# Patient Record
Sex: Female | Born: 1985 | Race: Black or African American | Hispanic: No | Smoking: Former smoker
Health system: Southern US, Community
[De-identification: ages and names within clinical notes are randomized; demographics above are authoritative.]

## PROBLEM LIST (undated history)

## (undated) ENCOUNTER — Inpatient Hospital Stay (HOSPITAL_COMMUNITY): Payer: Self-pay

## (undated) DIAGNOSIS — B009 Herpesviral infection, unspecified: Secondary | ICD-10-CM

## (undated) DIAGNOSIS — R519 Headache, unspecified: Secondary | ICD-10-CM

## (undated) DIAGNOSIS — R51 Headache: Secondary | ICD-10-CM

## (undated) HISTORY — PX: BUNIONECTOMY: SHX129

## (undated) HISTORY — DX: Herpesviral infection, unspecified: B00.9

---

## 2010-11-15 ENCOUNTER — Inpatient Hospital Stay (HOSPITAL_COMMUNITY)
Admission: AD | Admit: 2010-11-15 | Discharge: 2010-11-15 | Disposition: A | Discharge status: Home / Self Care | Payer: Managed Care, Other (non HMO) | Source: Ambulatory Visit | Attending: Obstetrics and Gynecology | Admitting: Obstetrics and Gynecology

## 2010-11-15 ENCOUNTER — Encounter (HOSPITAL_COMMUNITY): Payer: Self-pay

## 2010-11-15 ENCOUNTER — Inpatient Hospital Stay (HOSPITAL_COMMUNITY): Payer: Managed Care, Other (non HMO)

## 2010-11-15 DIAGNOSIS — O200 Threatened abortion: Secondary | ICD-10-CM | POA: Insufficient documentation

## 2010-11-15 LAB — POCT PREGNANCY, URINE: Preg Test, Ur: POSITIVE

## 2010-11-15 LAB — URINE MICROSCOPIC-ADD ON

## 2010-11-15 LAB — URINALYSIS, ROUTINE W REFLEX MICROSCOPIC
Leukocytes, UA: NEGATIVE
Nitrite: NEGATIVE
Protein, ur: NEGATIVE mg/dL
Specific Gravity, Urine: 1.015 (ref 1.005–1.030)
Urobilinogen, UA: 0.2 mg/dL (ref 0.0–1.0)

## 2010-11-15 LAB — CBC
MCH: 29.6 pg (ref 26.0–34.0)
MCHC: 34.7 g/dL (ref 30.0–36.0)
MCV: 85.2 fL (ref 78.0–100.0)
Platelets: 254 10*3/uL (ref 150–400)
RBC: 4.46 MIL/uL (ref 3.87–5.11)

## 2010-11-15 LAB — ABO/RH: ABO/RH(D): B POS

## 2010-11-15 LAB — WET PREP, GENITAL: Trich, Wet Prep: NONE SEEN

## 2010-11-15 NOTE — ED Provider Notes (Signed)
History     Chief Complaint  Patient presents with  . Vaginal Bleeding  . Abdominal Cramping   HPI Brianna Palmer 25 y.o. 5w 2d gestation.  LMP 10-09-10.  Had brown discharge yesterday.  Today has lower abdominal cramping and dark red bleeding.  Is very concerned about this pregnancy.  Has not started prenatal care, but has an appointment on Dec 4 at [facility]   OB History    Grav Para Term Preterm Abortions TAB SAB Ect Mult Living   2    1  1          Past Medical History  Diagnosis Date  . No pertinent past medical history     No past surgical history on file.  No family history on file.  History  Substance Use Topics  . Smoking status: Never Smoker   . Smokeless tobacco: Never Used  . Alcohol Use: No    Allergies: No Known Allergies  Prescriptions prior to admission  Medication Sig Dispense Refill  . oxymetazoline (AFRIN) 0.05 % nasal spray Place 2 sprays into the nose as needed. For congestion       . prenatal vitamin w/FE, FA (PRENATAL 1 + 1) 27-1 MG TABS Take 1 tablet by mouth daily.        Marland Kitchen triamcinolone (KENALOG) 0.1 % cream Apply 1 application topically as needed. For eczema         Review of Systems  Gastrointestinal: Positive for abdominal pain.       Vaginal bleeding   Physical Exam   Blood pressure 115/75, pulse 82, temperature 98.1 F (36.7 C), resp. rate 16, height 5\' 3"  (1.6 m), weight 125 lb 12.8 oz (57.063 kg), last menstrual period 10/09/2010.  Physical Exam  Nursing note and vitals reviewed. Constitutional: She is oriented to person, place, and time. She appears well-developed and well-nourished.  HENT:  Head: Normocephalic.  Eyes: EOM are normal.  Neck: Neck supple.  GI: Soft. There is tenderness. There is no rebound and no guarding.  Genitourinary:       Speculum exam: Vulva: small amount of blood noted Vagina - Mod amount of dark blood noted, small amount of clots Cervix - small amount of active bleeding noted Bimanual exam: Cervix  closed Uterus - mildly tender, normal size Adnexa non tender, no masses bilaterally, hard stool palpated in rectum with bimanual exam GC/Chlam, wet prep done Chaperone present for exam.  Musculoskeletal: Normal range of motion.  Neurological: She is alert and oriented to person, place, and time.  Skin: Skin is warm and dry.  Psychiatric: She has a normal mood and affect.    MAU Course  Procedures  MDM Clinical Data: bleeding, LMP 10-09-10,bleeding; ;  OBSTETRIC <14 WK [place] AND TRANSVAGINAL OB US  Technique: Both transabdominal and transvaginal ultrasound  examinations were performed for complete evaluation of the  gestation as well as the maternal uterus, adnexal regions, and  pelvic cul-de-sac.  Comparison: None.  Findings: No definite intrauterine gestational sac is visualized.  Endometrium 4 mm in thickness. Tiny cystic area seen in the lower  uterine segment/cervical region of unknown etiology. Cannot  completely exclude this is a gestational sac with an in progress  abortion. Recommend follow up serial quantitative beta HCGs and  ultrasounds.  Right corpus luteal cyst. No adnexal masses or abnormality. Trace  free fluid in the pelvis.  IMPRESSION:  Tiny cystic area noted within the lower uterine segment/cervical  region of the endometrium. This may represent a small amount  of  fluid or could represent an early gestational sac. Recommend  follow up with serial quantitative beta HCGs and ultrasounds  Results for orders placed during the hospital encounter of 11/15/10 (from the past 24 hour(s))  URINALYSIS, ROUTINE W REFLEX MICROSCOPIC     Status: Abnormal   Collection Time   11/15/10  9:45 AM      Component Value Range   Color, Urine YELLOW  YELLOW    Appearance CLOUDY (*) CLEAR    Specific Gravity, Urine 1.015  1.005 - 1.030    pH 6.5  5.0 - 8.0    Glucose, UA NEGATIVE  NEGATIVE (mg/dL)   Hgb urine dipstick LARGE (*) NEGATIVE    Bilirubin Urine NEGATIVE  NEGATIVE     Ketones, ur NEGATIVE  NEGATIVE (mg/dL)   Protein, ur NEGATIVE  NEGATIVE (mg/dL)   Urobilinogen, UA 0.2  0.0 - 1.0 (mg/dL)   Nitrite NEGATIVE  NEGATIVE    Leukocytes, UA NEGATIVE  NEGATIVE   URINE MICROSCOPIC-ADD ON     Status: Abnormal   Collection Time   11/15/10  9:45 AM      Component Value Range   Squamous Epithelial / LPF FEW (*) RARE    WBC, UA 0-2  <3 (WBC/hpf)   RBC / HPF 21-50  <3 (RBC/hpf)   Bacteria, UA RARE  RARE   POCT PREGNANCY, URINE     Status: Normal   Collection Time   11/15/10  9:53 AM      Component Value Range   Preg Test, Ur POSITIVE    CBC     Status: Normal   Collection Time   11/15/10 10:04 AM      Component Value Range   WBC 6.2  4.0 - 10.5 (K/uL)   RBC 4.46  3.87 - 5.11 (MIL/uL)   Hemoglobin 13.2  12.0 - 15.0 (g/dL)   HCT 16.1  09.6 - 04.5 (%)   MCV 85.2  78.0 - 100.0 (fL)   MCH 29.6  26.0 - 34.0 (pg)   MCHC 34.7  30.0 - 36.0 (g/dL)   RDW 40.9  81.1 - 91.4 (%)   Platelets 254  150 - 400 (K/uL)  ABO/RH     Status: Normal   Collection Time   11/15/10 10:05 AM      Component Value Range   ABO/RH(D) B POS    HCG, QUANTITATIVE, PREGNANCY     Status: Abnormal   Collection Time   11/15/10 10:05 AM      Component Value Range   hCG, Beta Chain, Quant, S 10 (*) <5 (mIU/mL)  WET PREP, GENITAL     Status: Abnormal   Collection Time   11/15/10 10:05 AM      Component Value Range   Yeast, Wet Prep NONE SEEN  NONE SEEN    Trich, Wet Prep NONE SEEN  NONE SEEN    Clue Cells, Wet Prep NONE SEEN  NONE SEEN    WBC, Wet Prep HPF POC FEW (*) NONE SEEN    Assessment and Plan  Threatened miscarriage Low quant - 10  Plan:  It is likely this pregnancy will end in miscarriage.   You need to return on Tuesday morning between 8 am and 12 noon for repeat lab work to confirm the diagnosis.  Expect to be at the hospital for at least 2 hours. No tampons, no douching, no sex. Nothing in the vagina.  BURLESON,TERRI 11/15/2010, 10:35 AM   Nolene Bernheim, NP 11/15/10  1150

## 2010-11-15 NOTE — Progress Notes (Signed)
Patient presents with brown yesterday, today almost like a period, intercourse 3 days ago, cramping

## 2010-11-16 LAB — GC/CHLAMYDIA PROBE AMP, GENITAL: GC Probe Amp, Genital: NEGATIVE

## 2011-01-02 ENCOUNTER — Inpatient Hospital Stay (HOSPITAL_COMMUNITY)
Admission: AD | Admit: 2011-01-02 | Discharge: 2011-01-02 | Disposition: A | Discharge status: Home / Self Care | Payer: Managed Care, Other (non HMO) | Source: Ambulatory Visit | Attending: Obstetrics & Gynecology | Admitting: Obstetrics & Gynecology

## 2011-01-02 ENCOUNTER — Other Ambulatory Visit (HOSPITAL_COMMUNITY): Payer: Managed Care, Other (non HMO)

## 2011-01-02 ENCOUNTER — Inpatient Hospital Stay (HOSPITAL_COMMUNITY): Payer: Managed Care, Other (non HMO)

## 2011-01-02 ENCOUNTER — Encounter (HOSPITAL_COMMUNITY): Payer: Self-pay | Admitting: *Deleted

## 2011-01-02 DIAGNOSIS — O209 Hemorrhage in early pregnancy, unspecified: Secondary | ICD-10-CM | POA: Insufficient documentation

## 2011-01-02 LAB — URINALYSIS, ROUTINE W REFLEX MICROSCOPIC
Glucose, UA: NEGATIVE mg/dL
Ketones, ur: 15 mg/dL — AB
Leukocytes, UA: NEGATIVE
Protein, ur: NEGATIVE mg/dL
Urobilinogen, UA: 0.2 mg/dL (ref 0.0–1.0)

## 2011-01-02 LAB — DIFFERENTIAL
Basophils Absolute: 0 10*3/uL (ref 0.0–0.1)
Lymphocytes Relative: 34 % (ref 12–46)
Neutro Abs: 3 10*3/uL (ref 1.7–7.7)

## 2011-01-02 LAB — CBC
Platelets: 220 10*3/uL (ref 150–400)
RBC: 4.43 MIL/uL (ref 3.87–5.11)
RDW: 13.2 % (ref 11.5–15.5)
WBC: 5.9 10*3/uL (ref 4.0–10.5)

## 2011-01-02 LAB — URINE MICROSCOPIC-ADD ON

## 2011-01-02 LAB — WET PREP, GENITAL
Trich, Wet Prep: NONE SEEN
Yeast Wet Prep HPF POC: NONE SEEN

## 2011-01-02 LAB — HCG, QUANTITATIVE, PREGNANCY: hCG, Beta Chain, Quant, S: 1066 m[IU]/mL — ABNORMAL HIGH (ref <5)

## 2011-01-02 NOTE — ED Provider Notes (Signed)
History     CSN: [id number]  Arrival date & time 01/02/11  1439   None     Chief Complaint  Patient presents with  . Vaginal Bleeding    HPI Brianna Palmer is a 25 y.o. female presents to MAU for bleeding in early pregnancy. She had a miscarriage and then got pregnant soon after. (before she even had another period). She had a Bhcg drawn in the office last week and then repeated. She has an appointment for follow up on Monday 01/04/11 but came in today due to the bleeding.   Past Medical History  Diagnosis Date  . No pertinent past medical history     Past Surgical History  Procedure Date  . Bunionectomy   . No past surgeries     Family History  Problem Relation Age of Onset  . Adopted: Yes    History  Substance Use Topics  . Smoking status: Never Smoker   . Smokeless tobacco: Never Used  . Alcohol Use: No    OB History    Grav Para Term Preterm Abortions TAB SAB Ect Mult Living   3    2 1 1          Review of Systems  Genitourinary: Positive for vaginal bleeding.       Pregnant   All other systems reviewed and are negative.    Allergies  Review of patient's allergies indicates no known allergies.  Home Medications  No current outpatient prescriptions on file.  BP 112/72  Pulse 77  Temp(Src) 97.9 F (36.6 C) (Oral)  Resp 18  Ht 5\' 3"  (1.6 m)  Wt 126 lb 9.6 oz (57.425 kg)  BMI 22.43 kg/m2  LMP 10/09/2010  Breastfeeding? No  Physical Exam  Nursing note and vitals reviewed. Constitutional: She is oriented to person, place, and time. She appears well-developed and well-nourished. No distress.  HENT:  Head: Normocephalic.  Eyes: EOM are normal.  Neck: Neck supple.  Cardiovascular: Normal rate.   Pulmonary/Chest: Effort normal.  Abdominal: Soft. There is no tenderness.  Genitourinary:       External genitalia without lesions. Small amount of blood vaginal vault. Cervix long, closed and minimal CMT. No adnexal mass or tenderness palpated. Uterus  without palpable enlargement.  Musculoskeletal: Normal range of motion.  Neurological: She is alert and oriented to person, place, and time. No cranial nerve deficit.  Skin: Skin is warm and dry.  Psychiatric: She has a normal mood and affect. Her behavior is normal. Judgment and thought content normal.    ED Course  Procedures   Results for orders placed during the hospital encounter of 01/02/11 (from the past 24 hour(s))  URINALYSIS, ROUTINE W REFLEX MICROSCOPIC     Status: Abnormal   Collection Time   01/02/11  3:00 PM      Component Value Range   Color, Urine YELLOW  YELLOW    APPearance CLEAR  CLEAR    Specific Gravity, Urine 1.020  1.005 - 1.030    pH 6.0  5.0 - 8.0    Glucose, UA NEGATIVE  NEGATIVE (mg/dL)   Hgb urine dipstick SMALL (*) NEGATIVE    Bilirubin Urine NEGATIVE  NEGATIVE    Ketones, ur 15 (*) NEGATIVE (mg/dL)   Protein, ur NEGATIVE  NEGATIVE (mg/dL)   Urobilinogen, UA 0.2  0.0 - 1.0 (mg/dL)   Nitrite NEGATIVE  NEGATIVE    Leukocytes, UA NEGATIVE  NEGATIVE   URINE MICROSCOPIC-ADD ON     Status: Normal  Collection Time   01/02/11  3:00 PM      Component Value Range   Squamous Epithelial / LPF RARE  RARE    WBC, UA 0-2  <3 (WBC/hpf)   RBC / HPF 0-2  <3 (RBC/hpf)   Bacteria, UA RARE  RARE   POCT PREGNANCY, URINE     Status: Normal   Collection Time   01/02/11  3:07 PM      Component Value Range   Preg Test, Ur POSITIVE    CBC     Status: Normal   Collection Time   01/02/11  3:46 PM      Component Value Range   WBC 5.9  4.0 - 10.5 (K/uL)   RBC 4.43  3.87 - 5.11 (MIL/uL)   Hemoglobin 13.2  12.0 - 15.0 (g/dL)   HCT 95.2  84.1 - 32.4 (%)   MCV 84.9  78.0 - 100.0 (fL)   MCH 29.8  26.0 - 34.0 (pg)   MCHC 35.1  30.0 - 36.0 (g/dL)   RDW 40.1  02.7 - 25.3 (%)   Platelets 220  150 - 400 (K/uL)  DIFFERENTIAL     Status: Normal   Collection Time   01/02/11  3:46 PM      Component Value Range   Neutrophils Relative 52  43 - 77 (%)   Neutro Abs 3.0  1.7 -  7.7 (K/uL)   Lymphocytes Relative 34  12 - 46 (%)   Lymphs Abs 2.0  0.7 - 4.0 (K/uL)   Monocytes Relative 12  3 - 12 (%)   Monocytes Absolute 0.7  0.1 - 1.0 (K/uL)   Eosinophils Relative 2  0 - 5 (%)   Eosinophils Absolute 0.1  0.0 - 0.7 (K/uL)   Basophils Relative 0  0 - 1 (%)   Basophils Absolute 0.0  0.0 - 0.1 (K/uL)  HCG, QUANTITATIVE, PREGNANCY     Status: Abnormal   Collection Time   01/02/11  3:46 PM      Component Value Range   hCG, Beta Chain, Quant, S 1066 (*) <5 (mIU/mL)  WET PREP, GENITAL     Status: Abnormal   Collection Time   01/02/11  4:42 PM      Component Value Range   Yeast, Wet Prep NONE SEEN  NONE SEEN    Trich, Wet Prep NONE SEEN  NONE SEEN    Clue Cells, Wet Prep NONE SEEN  NONE SEEN    WBC, Wet Prep HPF POC FEW (*) NONE SEEN    US Ob Transvaginal  01/02/2011  *RADIOLOGY REPORT*  Clinical Data: Early pregnancy, spotting  TRANSVAGINAL OBSTETRIC US  Technique:  Transvaginal ultrasound was performed for complete evaluation of the gestation as well as the maternal uterus, adnexal regions, and pelvic cul-de-sac.  Comparison:  None.  Intrauterine gestational sac: Single.  The gestational sac is slightly elongated and thinned. Yolk sac: Not identified Embryo: Not identified  MSD: 5.1  mm  5    w 0    d  Subchorionic hemorrhage:  Maternal uterus/adnexae:  Arcuate uterus. The adnexa appear normal.  No free fluid.  IMPRESSION:  1.  Single intrauterine gestational sac.  The sac is slightly elongated.  No evidence of yolk sac or embryo.  Differential includes early intrauterine pregnancy, spontaneous abortion, or ectopic pregnancy. 2.  Estimated gestational age by mean sac diameter equals 5 weeks 0 days. 3.  No free fluid.  Original Report Authenticated By: Genevive Bi, M.D.   Assessment:  Bleeding in early pregnancy  Plan:  Follow up as scheduled in the office   Return here as needed MDM          Kerrie Buffalo, NP 01/02/11 1756

## 2011-01-02 NOTE — Progress Notes (Signed)
Pt reports having mild cramping that started yesterday with small amount of brownish blood. Went away but started again this morning. Also reports having vomited after lunch today.

## 2011-01-26 ENCOUNTER — Other Ambulatory Visit: Payer: Self-pay | Admitting: Obstetrics & Gynecology

## 2011-03-08 ENCOUNTER — Other Ambulatory Visit: Payer: Self-pay | Admitting: Obstetrics & Gynecology

## 2011-03-11 ENCOUNTER — Encounter (HOSPITAL_COMMUNITY): Payer: Self-pay

## 2011-03-11 ENCOUNTER — Ambulatory Visit (HOSPITAL_COMMUNITY)
Admission: RE | Admit: 2011-03-11 | Discharge: 2011-03-11 | Disposition: A | Discharge status: Home / Self Care | Payer: Managed Care, Other (non HMO) | Source: Ambulatory Visit | Attending: Obstetrics & Gynecology | Admitting: Obstetrics & Gynecology

## 2011-03-11 DIAGNOSIS — Q519 Congenital malformation of uterus and cervix, unspecified: Secondary | ICD-10-CM | POA: Insufficient documentation

## 2011-03-11 DIAGNOSIS — N96 Recurrent pregnancy loss: Secondary | ICD-10-CM | POA: Insufficient documentation

## 2011-03-11 LAB — PREGNANCY, URINE: Preg Test, Ur: NEGATIVE

## 2011-03-11 MED ORDER — IOHEXOL 300 MG/ML  SOLN
5.0000 mL | Freq: Once | INTRAMUSCULAR | Status: AC | PRN
  Administered 2011-03-11: 5 mL

## 2011-07-28 ENCOUNTER — Other Ambulatory Visit: Payer: Self-pay | Admitting: Obstetrics

## 2011-07-28 DIAGNOSIS — O2620 Pregnancy care for patient with recurrent pregnancy loss, unspecified trimester: Secondary | ICD-10-CM

## 2011-07-28 LAB — OB RESULTS CONSOLE ANTIBODY SCREEN: Antibody Screen: NEGATIVE

## 2011-07-28 LAB — OB RESULTS CONSOLE GC/CHLAMYDIA: Gonorrhea: NEGATIVE

## 2011-07-30 ENCOUNTER — Ambulatory Visit (HOSPITAL_COMMUNITY)
Admission: RE | Admit: 2011-07-30 | Discharge: 2011-07-30 | Disposition: A | Discharge status: Home / Self Care | Payer: Managed Care, Other (non HMO) | Source: Ambulatory Visit | Attending: Obstetrics | Admitting: Obstetrics

## 2011-07-30 DIAGNOSIS — O2620 Pregnancy care for patient with recurrent pregnancy loss, unspecified trimester: Secondary | ICD-10-CM

## 2011-07-30 DIAGNOSIS — O3680X0 Pregnancy with inconclusive fetal viability, not applicable or unspecified: Secondary | ICD-10-CM | POA: Insufficient documentation

## 2011-08-12 ENCOUNTER — Other Ambulatory Visit: Payer: Self-pay | Admitting: Obstetrics & Gynecology

## 2011-08-12 DIAGNOSIS — O3680X0 Pregnancy with inconclusive fetal viability, not applicable or unspecified: Secondary | ICD-10-CM

## 2011-08-19 ENCOUNTER — Ambulatory Visit (HOSPITAL_COMMUNITY)
Admission: RE | Admit: 2011-08-19 | Discharge: 2011-08-19 | Disposition: A | Discharge status: Home / Self Care | Payer: Managed Care, Other (non HMO) | Source: Ambulatory Visit | Attending: Obstetrics & Gynecology | Admitting: Obstetrics & Gynecology

## 2011-08-19 ENCOUNTER — Other Ambulatory Visit: Payer: Self-pay | Admitting: Obstetrics & Gynecology

## 2011-08-19 DIAGNOSIS — O3680X0 Pregnancy with inconclusive fetal viability, not applicable or unspecified: Secondary | ICD-10-CM

## 2011-08-19 DIAGNOSIS — Z3689 Encounter for other specified antenatal screening: Secondary | ICD-10-CM | POA: Insufficient documentation

## 2011-09-12 ENCOUNTER — Ambulatory Visit (INDEPENDENT_AMBULATORY_CARE_PROVIDER_SITE_OTHER): Payer: Managed Care, Other (non HMO) | Admitting: Emergency Medicine

## 2011-09-12 VITALS — BP 92/60 | HR 97 | Temp 98.4°F | Resp 18

## 2011-09-12 DIAGNOSIS — J40 Bronchitis, not specified as acute or chronic: Secondary | ICD-10-CM

## 2011-09-12 DIAGNOSIS — J018 Other acute sinusitis: Secondary | ICD-10-CM

## 2011-09-12 MED ORDER — CEFPROZIL 500 MG PO TABS: 500.0000 mg | ORAL_TABLET | Freq: Two times a day (BID) | ORAL | Status: AC

## 2011-09-12 NOTE — Progress Notes (Signed)
   Date:  09/12/2011   Name:  Zhane Donlan   DOB:  Aug 30, 1985   MRN:  [id number] Gender: female Age: 26 y.o.  PCP:  Tally Due, MD    Chief Complaint: Sore Throat and URI   History of Present Illness:  Quentin Strebel is a 26 y.o. pleasant patient who presents with the following:  Friday became ill with sore throat, nasal congestion, post nasal drainage and a cough.  cough productive purulent sputum.  No fever or chills.  No shortness of breath or wheezing.  [redacted] weeks pregnant.  No OTC meds.  There is no problem list on file for this patient.   Past Medical History  Diagnosis Date  . No pertinent past medical history     Past Surgical History  Procedure Date  . Bunionectomy   . No past surgeries     History  Substance Use Topics  . Smoking status: Never Smoker   . Smokeless tobacco: Never Used  . Alcohol Use: No    Family History  Problem Relation Age of Onset  . Adopted: Yes    No Known Allergies  Medication list has been reviewed and updated.  Current Outpatient Prescriptions on File Prior to Visit  Medication Sig Dispense Refill  . oxymetazoline (AFRIN) 0.05 % nasal spray Place 2 sprays into the nose as needed. For congestion       . prenatal vitamin w/FE, FA (PRENATAL 1 + 1) 27-1 MG TABS Take 1 tablet by mouth daily.          Review of Systems:  As per HPI, otherwise negative.    Physical Examination: Filed Vitals:   09/12/11 1358  BP: 92/60  Pulse: 97  Temp: 98.4 F (36.9 C)  Resp: 18   There were no vitals filed for this visit. There is no height or weight on file to calculate BMI. Ideal Body Weight:    GEN: WDWN, NAD, Non-toxic, A & O x 3 HEENT: Atraumatic, Normocephalic. Neck supple. No masses, No LAD. Ears and Nose: No external deformity.  Nasal mucosa erythematous and swollen  CV: RRR, No M/G/R. No JVD. No thrill. No extra heart sounds. PULM: CTA B, no wheezes, crackles, rhonchi. No retractions. No resp. distress. No  accessory muscle use. ABD: S, NT, ND, +BS. No rebound. No HSM. EXTR: No c/c/e NEURO Normal gait.      Assessment and Plan: Sinusitis Bronchitis Pregnancy, incidental cefzil Robitussin OTC Decongestant on list by OB Follow up as needed  Carmelina Dane, MD

## 2011-11-09 ENCOUNTER — Ambulatory Visit (HOSPITAL_COMMUNITY)
Admission: RE | Admit: 2011-11-09 | Discharge: 2011-11-09 | Disposition: A | Discharge status: Home / Self Care | Payer: Managed Care, Other (non HMO) | Source: Ambulatory Visit | Attending: Obstetrics & Gynecology | Admitting: Obstetrics & Gynecology

## 2011-11-09 ENCOUNTER — Other Ambulatory Visit: Payer: Self-pay | Admitting: Obstetrics & Gynecology

## 2011-11-09 DIAGNOSIS — Z3689 Encounter for other specified antenatal screening: Secondary | ICD-10-CM

## 2011-11-09 DIAGNOSIS — O358XX0 Maternal care for other (suspected) fetal abnormality and damage, not applicable or unspecified: Secondary | ICD-10-CM | POA: Insufficient documentation

## 2011-11-09 DIAGNOSIS — Z363 Encounter for antenatal screening for malformations: Secondary | ICD-10-CM | POA: Insufficient documentation

## 2011-11-09 DIAGNOSIS — Z1389 Encounter for screening for other disorder: Secondary | ICD-10-CM | POA: Insufficient documentation

## 2012-01-09 ENCOUNTER — Encounter (HOSPITAL_COMMUNITY): Payer: Self-pay | Admitting: Obstetrics and Gynecology

## 2012-01-09 ENCOUNTER — Inpatient Hospital Stay (HOSPITAL_COMMUNITY)
Admission: AD | Admit: 2012-01-09 | Discharge: 2012-01-09 | Disposition: A | Discharge status: Home / Self Care | Payer: Managed Care, Other (non HMO) | Source: Ambulatory Visit | Attending: Obstetrics & Gynecology | Admitting: Obstetrics & Gynecology

## 2012-01-09 DIAGNOSIS — O26899 Other specified pregnancy related conditions, unspecified trimester: Secondary | ICD-10-CM

## 2012-01-09 DIAGNOSIS — R109 Unspecified abdominal pain: Secondary | ICD-10-CM

## 2012-01-09 DIAGNOSIS — O99891 Other specified diseases and conditions complicating pregnancy: Secondary | ICD-10-CM | POA: Insufficient documentation

## 2012-01-09 LAB — URINALYSIS, ROUTINE W REFLEX MICROSCOPIC
Hgb urine dipstick: NEGATIVE
Leukocytes, UA: NEGATIVE
Nitrite: NEGATIVE
Protein, ur: NEGATIVE mg/dL
Specific Gravity, Urine: 1.01 (ref 1.005–1.030)
Urobilinogen, UA: 0.2 mg/dL (ref 0.0–1.0)

## 2012-01-09 NOTE — MAU Provider Note (Signed)
History     CSN: [id number]  Arrival date and time: 01/09/12 1503   First Provider Initiated Contact with Patient 01/09/12 1547      Chief Complaint  Patient presents with  . Back Pain  . Abdominal Pain   HPI Brianna Palmer is 25 y.o. G3P0020 [redacted]w[redacted]d weeks presenting with upper abdominal pain while at work.  States she had a Braxton-Hicks contraction and then the pain began and made her back hurt and down the side of her abdomen.    + fetal movement.  Last ate breakfast at 8:30.  Is hungry now but pain began.      Past Medical History  Diagnosis Date  . No pertinent past medical history     Past Surgical History  Procedure Date  . Bunionectomy   . No past surgeries     Family History  Problem Relation Age of Onset  . Adopted: Yes    History  Substance Use Topics  . Smoking status: Never Smoker   . Smokeless tobacco: Never Used  . Alcohol Use: No    Allergies: No Known Allergies  Prescriptions prior to admission  Medication Sig Dispense Refill  . oxymetazoline (AFRIN) 0.05 % nasal spray Place 2 sprays into the nose as needed. For congestion       . prenatal vitamin w/FE, FA (PRENATAL 1 + 1) 27-1 MG TABS Take 1 tablet by mouth daily.          Review of Systems  Constitutional: Negative.   HENT: Negative.   Gastrointestinal: Positive for abdominal pain (upper, mid abdominal pain radiating down the sides of her abdomen).  Genitourinary: Negative for dysuria, urgency and frequency.       Negative for bleeding.  + Fetal movement   Physical Exam   Blood pressure 113/68, pulse 85, temperature 97.7 F (36.5 C), temperature source Oral, resp. rate 16, last menstrual period 10/09/2010.  Physical Exam  Constitutional: She is oriented to person, place, and time. She appears well-developed and well-nourished. No distress.  HENT:  Head: Normocephalic.  Neck: Normal range of motion.  Cardiovascular: Normal rate.   Respiratory: Effort normal.  GI: There is no  tenderness. There is no rebound and no guarding.  Genitourinary:       Cervical exam by Candise Bowens, RN--Closed, long and thick  Neurological: She is alert and oriented to person, place, and time.  Skin: Skin is warm and dry.  Psychiatric: She has a normal mood and affect. Her behavior is normal.   Results for orders placed during the hospital encounter of 01/09/12 (from the past 24 hour(s))  URINALYSIS, ROUTINE W REFLEX MICROSCOPIC     Status: Abnormal   Collection Time   01/09/12  3:22 PM      Component Value Range   Color, Urine YELLOW  YELLOW   APPearance CLEAR  CLEAR   Specific Gravity, Urine 1.010  1.005 - 1.030   pH 6.0  5.0 - 8.0   Glucose, UA NEGATIVE  NEGATIVE mg/dL   Hgb urine dipstick NEGATIVE  NEGATIVE   Bilirubin Urine NEGATIVE  NEGATIVE   Ketones, ur 15 (*) NEGATIVE mg/dL   Protein, ur NEGATIVE  NEGATIVE mg/dL   Urobilinogen, UA 0.2  0.0 - 1.0 mg/dL   Nitrite NEGATIVE  NEGATIVE   Leukocytes, UA NEGATIVE  NEGATIVE   MAU Course  Procedures  FMS  Uterine irritability  Baseline 150 Reactive  MDM 15:55  Reported MSE to Dr. Tamela Oddi.  Order given to po hydrate and  check cervix.  FFN not indicated with absence of contractions on monitor. 16:45  Patient is feeling better.    Up to bathroom.  Will monitor her for 20 more minutes and call Dr. Tamela Oddi.   17:30  Reported cervical exam, improvement of upper abdominal pain and reactive FMS Assessment and Plan  A:  Upper Abdominal pain at [redacted]w[redacted]d gestation -improved with po hydration  P:  Discharge to home     Stressed importance of eating regularly and staying well hydrated     Patient is asking for a note to be out of work tomorrow-given    Follow up with Serita Kyle M 01/09/2012, 3:48 PM

## 2012-02-17 ENCOUNTER — Inpatient Hospital Stay (HOSPITAL_COMMUNITY)
Admission: AD | Admit: 2012-02-17 | Discharge: 2012-02-17 | Disposition: A | Discharge status: Home / Self Care | Payer: Managed Care, Other (non HMO) | Source: Ambulatory Visit | Attending: Obstetrics & Gynecology | Admitting: Obstetrics & Gynecology

## 2012-02-17 ENCOUNTER — Encounter (HOSPITAL_COMMUNITY): Payer: Self-pay | Admitting: *Deleted

## 2012-02-17 DIAGNOSIS — N949 Unspecified condition associated with female genital organs and menstrual cycle: Secondary | ICD-10-CM | POA: Insufficient documentation

## 2012-02-17 DIAGNOSIS — O99891 Other specified diseases and conditions complicating pregnancy: Secondary | ICD-10-CM | POA: Insufficient documentation

## 2012-02-17 NOTE — MAU Provider Note (Signed)
History     CSN: [id number]  Arrival date and time: 02/17/12 0107   None     Chief Complaint  Patient presents with  . Rupture of Membranes   HPI Brianna Palmer is a 27 y.o. female @ [redacted]w[redacted]d gestation who presents to MAU with vaginal discharge. Just after midnight she was on the couch and stood up to go to the bathroom and after voiding stood up and felt watery fluid down her leg. No leaking of fluid since then. Denies pain or other problems. Good fetal movement. The history was provided by the patient.  OB History    Grav Para Term Preterm Abortions TAB SAB Ect Mult Living   4 0   3 1 2          Past Medical History  Diagnosis Date  . No pertinent past medical history     Past Surgical History  Procedure Date  . Bunionectomy   . No past surgeries     Family History  Problem Relation Age of Onset  . Adopted: Yes  . Other Neg Hx     History  Substance Use Topics  . Smoking status: Light Tobacco Smoker -- 0.2 packs/day    Types: Cigarettes  . Smokeless tobacco: Never Used  . Alcohol Use: No    Allergies: No Known Allergies  Prescriptions prior to admission  Medication Sig Dispense Refill  . butalbital-acetaminophen-caffeine (FIORICET, ESGIC) 50-325-40 MG per tablet Take 1 tablet by mouth daily as needed. headaches      . Prenatal Vit-Fe Fumarate-FA (PRENATAL MULTIVITAMIN) TABS Take 1 tablet by mouth daily.        Review of Systems  Constitutional: Negative for fever and chills.  Eyes: Negative for blurred vision and double vision.  Respiratory: Negative for cough.   Cardiovascular: Negative for chest pain.  Gastrointestinal: Negative for nausea, vomiting and abdominal pain.  Genitourinary: Negative for dysuria and urgency.       Vaginal discharge  Musculoskeletal: Negative for back pain.  Neurological: Negative for dizziness and headaches.  Psychiatric/Behavioral: Negative for depression. The patient is not nervous/anxious.    Physical Exam   Blood  pressure 117/79, pulse 113, temperature 97.4 F (36.3 C), temperature source Oral, resp. rate 18, height 5\' 3"  (1.6 m), weight 151 lb 9.6 oz (68.765 kg), last menstrual period 10/09/2010.  Physical Exam  Nursing note and vitals reviewed. Constitutional: She is oriented to person, place, and time. She appears well-developed and well-nourished. No distress.  HENT:  Head: Normocephalic and atraumatic.  Eyes: EOM are normal.  Neck: Neck supple.  Cardiovascular:       tachycardia  Respiratory: Effort normal.  GI: Soft. There is no tenderness.       Gravid consistent with dates  Genitourinary:       External genitalia without lesions. Thick white discharge vaginal vault. Cervix closed, thick, high.   Musculoskeletal: Normal range of motion.  Neurological: She is alert and oriented to person, place, and time.  Skin: Skin is warm and dry.  Psychiatric: She has a normal mood and affect. Her behavior is normal. Judgment and thought content normal.   EFM: Baseline initially 145 with on variable, turned patient on right side and base line 125, reactive tracing with one contraction Procedures  Assessment: 27 y.o. female @ [redacted]w[redacted]d gestation with vaginal discharge.   Fern negative  Plan:  Discussed with Dr. Tamela Oddi   If monitor tracing continues to be reactive over next 20 minutes many discharge home  Follow up in the office, return for any problems.  Termaine Roupp, RN, FNP, [facility] 02/17/2012, 1:58 AM

## 2012-02-17 NOTE — MAU Note (Signed)
Pt reports some leaking of fluid about midnight

## 2012-02-22 LAB — OB RESULTS CONSOLE GBS: GBS: NEGATIVE

## 2012-03-06 ENCOUNTER — Inpatient Hospital Stay (HOSPITAL_COMMUNITY)
Admission: AD | Admit: 2012-03-06 | Discharge: 2012-03-06 | Disposition: A | Discharge status: Home / Self Care | Payer: Managed Care, Other (non HMO) | Source: Ambulatory Visit | Attending: Obstetrics & Gynecology | Admitting: Obstetrics & Gynecology

## 2012-03-06 ENCOUNTER — Encounter (HOSPITAL_COMMUNITY): Payer: Self-pay

## 2012-03-06 DIAGNOSIS — R109 Unspecified abdominal pain: Secondary | ICD-10-CM | POA: Insufficient documentation

## 2012-03-06 DIAGNOSIS — O479 False labor, unspecified: Secondary | ICD-10-CM | POA: Insufficient documentation

## 2012-03-06 NOTE — MAU Note (Signed)
Patient states she is having lower abdominal pressure and with contractions feels pain in her cervix. Denies bleeding with slight discharge. Reports good fetal movement.

## 2012-03-06 NOTE — Progress Notes (Signed)
Dr Gaynell Face notified of patient, tracing, ctx pattern, sve result. Order to discharge home with labor precaution.

## 2012-03-15 ENCOUNTER — Encounter (HOSPITAL_COMMUNITY): Payer: Self-pay | Admitting: *Deleted

## 2012-03-15 ENCOUNTER — Telehealth (HOSPITAL_COMMUNITY): Payer: Self-pay | Admitting: *Deleted

## 2012-03-15 NOTE — Telephone Encounter (Signed)
Preadmission screen  

## 2012-03-28 ENCOUNTER — Other Ambulatory Visit: Payer: Self-pay | Admitting: Obstetrics

## 2012-03-28 ENCOUNTER — Encounter: Payer: Self-pay | Admitting: Obstetrics

## 2012-03-28 ENCOUNTER — Encounter: Payer: Managed Care, Other (non HMO) | Admitting: Obstetrics

## 2012-03-29 ENCOUNTER — Telehealth (HOSPITAL_COMMUNITY): Payer: Self-pay | Admitting: *Deleted

## 2012-03-29 ENCOUNTER — Encounter (HOSPITAL_COMMUNITY): Payer: Self-pay | Admitting: *Deleted

## 2012-03-29 ENCOUNTER — Inpatient Hospital Stay (HOSPITAL_COMMUNITY): Payer: Managed Care, Other (non HMO)

## 2012-03-29 ENCOUNTER — Inpatient Hospital Stay (HOSPITAL_COMMUNITY)
Admission: AD | Admit: 2012-03-29 | Discharge: 2012-04-02 | DRG: 766 | Disposition: A | Discharge status: Home / Self Care | Payer: Managed Care, Other (non HMO) | Source: Ambulatory Visit | Attending: Obstetrics & Gynecology | Admitting: Obstetrics & Gynecology

## 2012-03-29 DIAGNOSIS — O480 Post-term pregnancy: Secondary | ICD-10-CM | POA: Diagnosis present

## 2012-03-29 DIAGNOSIS — O4103X1 Oligohydramnios, third trimester, fetus 1: Secondary | ICD-10-CM

## 2012-03-29 DIAGNOSIS — O4103X0 Oligohydramnios, third trimester, not applicable or unspecified: Secondary | ICD-10-CM | POA: Diagnosis present

## 2012-03-29 DIAGNOSIS — O4100X0 Oligohydramnios, unspecified trimester, not applicable or unspecified: Principal | ICD-10-CM | POA: Diagnosis present

## 2012-03-29 LAB — CBC
HCT: 38.7 % (ref 36.0–46.0)
MCV: 89 fL (ref 78.0–100.0)
Platelets: 206 10*3/uL (ref 150–400)
RBC: 4.35 MIL/uL (ref 3.87–5.11)
WBC: 11.3 10*3/uL — ABNORMAL HIGH (ref 4.0–10.5)

## 2012-03-29 MED ORDER — ONDANSETRON HCL 4 MG/2ML IJ SOLN
4.0000 mg | Freq: Four times a day (QID) | INTRAMUSCULAR | Status: DC | PRN
  Administered 2012-03-29: 4 mg via INTRAVENOUS
  Filled 2012-03-29: qty 2

## 2012-03-29 MED ORDER — LACTATED RINGERS IV SOLN
INTRAVENOUS | Status: DC
  Administered 2012-03-29 – 2012-03-30 (×4): via INTRAVENOUS

## 2012-03-29 MED ORDER — OXYTOCIN 40 UNITS IN LACTATED RINGERS INFUSION - SIMPLE MED: 62.5000 mL/h | INTRAVENOUS | Status: DC

## 2012-03-29 MED ORDER — IBUPROFEN 600 MG PO TABS: 600.0000 mg | ORAL_TABLET | Freq: Four times a day (QID) | ORAL | Status: DC | PRN

## 2012-03-29 MED ORDER — DIPHENHYDRAMINE HCL 50 MG/ML IJ SOLN: 12.5000 mg | INTRAMUSCULAR | Status: DC | PRN

## 2012-03-29 MED ORDER — OXYTOCIN 40 UNITS IN LACTATED RINGERS INFUSION - SIMPLE MED
1.0000 m[IU]/min | INTRAVENOUS | Status: DC
Start: 2012-03-29 — End: 2012-03-30
  Administered 2012-03-29: 1 m[IU]/min via INTRAVENOUS
  Filled 2012-03-29: qty 1000

## 2012-03-29 MED ORDER — PHENYLEPHRINE 40 MCG/ML (10ML) SYRINGE FOR IV PUSH (FOR BLOOD PRESSURE SUPPORT)
80.0000 ug | PREFILLED_SYRINGE | INTRAVENOUS | Status: DC | PRN
  Filled 2012-03-29: qty 5

## 2012-03-29 MED ORDER — NALBUPHINE SYRINGE 5 MG/0.5 ML
5.0000 mg | INJECTION | INTRAMUSCULAR | Status: DC | PRN
  Administered 2012-03-29 (×2): 5 mg via INTRAVENOUS
  Filled 2012-03-29 (×2): qty 0.5

## 2012-03-29 MED ORDER — FENTANYL 2.5 MCG/ML BUPIVACAINE 1/10 % EPIDURAL INFUSION (WH - ANES)
INTRAMUSCULAR | Status: DC | PRN
  Administered 2012-03-29: 14 mL/h via EPIDURAL

## 2012-03-29 MED ORDER — CITRIC ACID-SODIUM CITRATE 334-500 MG/5ML PO SOLN
30.0000 mL | ORAL | Status: DC | PRN
  Administered 2012-03-30: 30 mL via ORAL

## 2012-03-29 MED ORDER — PHENYLEPHRINE 40 MCG/ML (10ML) SYRINGE FOR IV PUSH (FOR BLOOD PRESSURE SUPPORT): 80.0000 ug | PREFILLED_SYRINGE | INTRAVENOUS | Status: DC | PRN

## 2012-03-29 MED ORDER — FENTANYL 2.5 MCG/ML BUPIVACAINE 1/10 % EPIDURAL INFUSION (WH - ANES)
14.0000 mL/h | INTRAMUSCULAR | Status: DC | PRN
  Filled 2012-03-29: qty 125

## 2012-03-29 MED ORDER — ACETAMINOPHEN 325 MG PO TABS: 650.0000 mg | ORAL_TABLET | ORAL | Status: DC | PRN

## 2012-03-29 MED ORDER — LIDOCAINE HCL (PF) 1 % IJ SOLN: 30.0000 mL | INTRAMUSCULAR | Status: DC | PRN

## 2012-03-29 MED ORDER — PROMETHAZINE HCL 25 MG/ML IJ SOLN: 25.0000 mg | Freq: Four times a day (QID) | INTRAMUSCULAR | Status: DC | PRN

## 2012-03-29 MED ORDER — EPHEDRINE 5 MG/ML INJ
10.0000 mg | INTRAVENOUS | Status: DC | PRN
  Filled 2012-03-29: qty 4

## 2012-03-29 MED ORDER — OXYCODONE-ACETAMINOPHEN 5-325 MG PO TABS: 1.0000 | ORAL_TABLET | ORAL | Status: DC | PRN

## 2012-03-29 MED ORDER — NALBUPHINE SYRINGE 5 MG/0.5 ML
10.0000 mg | INJECTION | Freq: Four times a day (QID) | INTRAMUSCULAR | Status: DC | PRN
  Administered 2012-03-29: 10 mg via INTRAMUSCULAR
  Filled 2012-03-29: qty 1

## 2012-03-29 MED ORDER — LIDOCAINE HCL (PF) 1 % IJ SOLN
INTRAMUSCULAR | Status: DC | PRN
  Administered 2012-03-29 (×2): 8 mL

## 2012-03-29 MED ORDER — MISOPROSTOL 25 MCG QUARTER TABLET: 25.0000 ug | ORAL_TABLET | ORAL | Status: DC | PRN

## 2012-03-29 MED ORDER — LACTATED RINGERS IV SOLN
500.0000 mL | Freq: Once | INTRAVENOUS | Status: AC
  Administered 2012-03-29: 500 mL via INTRAVENOUS

## 2012-03-29 MED ORDER — TERBUTALINE SULFATE 1 MG/ML IJ SOLN
0.2500 mg | Freq: Once | INTRAMUSCULAR | Status: AC | PRN
  Filled 2012-03-29: qty 1

## 2012-03-29 MED ORDER — OXYTOCIN BOLUS FROM INFUSION: 500.0000 mL | INTRAVENOUS | Status: DC

## 2012-03-29 MED ORDER — EPHEDRINE 5 MG/ML INJ: 10.0000 mg | INTRAVENOUS | Status: DC | PRN

## 2012-03-29 MED ORDER — LACTATED RINGERS IV SOLN
500.0000 mL | INTRAVENOUS | Status: DC | PRN
  Administered 2012-03-29: 500 mL via INTRAVENOUS
  Administered 2012-03-29: 300 mL via INTRAVENOUS
  Administered 2012-03-30: 1000 mL via INTRAVENOUS

## 2012-03-29 NOTE — MAU Note (Signed)
Pt presents with complaints of contractions that started last night but has gotten stronger this am. States contractions are 11 mins apart

## 2012-03-29 NOTE — Anesthesia Preprocedure Evaluation (Signed)
Anesthesia Evaluation  Patient identified by MRN, date of birth, ID band Patient awake    Reviewed: Allergy & Precautions, H&P , NPO status , Patient's Chart, lab work & pertinent test results  History of Anesthesia Complications (+) PONV  Airway Mallampati: II TM Distance: >3 FB Neck ROM: full    Dental no notable dental hx.    Pulmonary neg pulmonary ROS,    Pulmonary exam normal       Cardiovascular negative cardio ROS      Neuro/Psych negative neurological ROS  negative psych ROS   GI/Hepatic negative GI ROS, Neg liver ROS,   Endo/Other  negative endocrine ROS  Renal/GU negative Renal ROS  negative genitourinary   Musculoskeletal negative musculoskeletal ROS (+)   Abdominal Normal abdominal exam  (+)   Peds negative pediatric ROS (+)  Hematology negative hematology ROS (+)   Anesthesia Other Findings   Reproductive/Obstetrics (+) Pregnancy                           Anesthesia Physical Anesthesia Plan  ASA: II  Anesthesia Plan: Epidural   Post-op Pain Management:    Induction:   Airway Management Planned:   Additional Equipment:   Intra-op Plan:   Post-operative Plan:   Informed Consent: I have reviewed the patients History and Physical, chart, labs and discussed the procedure including the risks, benefits and alternatives for the proposed anesthesia with the patient or authorized representative who has indicated his/her understanding and acceptance.     Plan Discussed with:   Anesthesia Plan Comments:         Anesthesia Quick Evaluation  

## 2012-03-29 NOTE — Anesthesia Procedure Notes (Signed)
Epidural Patient location during procedure: OB Start time: 03/29/2012 7:47 PM End time: 03/29/2012 7:54 PM  Staffing Anesthesiologist: Sandrea Hughs Performed by: anesthesiologist   Preanesthetic Checklist Completed: patient identified, site marked, surgical consent, pre-op evaluation, timeout performed, IV checked, risks and benefits discussed and monitors and equipment checked  Epidural Patient position: sitting Prep: site prepped and draped and DuraPrep Patient monitoring: continuous pulse ox and blood pressure Approach: midline Injection technique: LOR air  Needle:  Needle type: Tuohy  Needle gauge: 17 G Needle length: 9 cm and 9 Needle insertion depth: 6 cm Catheter type: closed end flexible Catheter size: 19 Gauge Catheter at skin depth: 11 cm Test dose: negative  Assessment Sensory level: T9 Events: blood not aspirated, injection not painful, no injection resistance, negative IV test and no paresthesia  Additional Notes Reason for block:procedure for pain

## 2012-03-29 NOTE — Progress Notes (Signed)
Brianna Palmer is a 27 y.o. G4P0030 at [redacted]w[redacted]d by LMP admitted for induction of labor due to Low amniotic fluid..  Subjective: Comfortable  Objective: BP 121/74  Pulse 91  Temp(Src) 97.8 F (36.6 C) (Axillary)  Resp 18  Ht 5\' 3"  (1.6 m)  Wt 163 lb (73.936 kg)  BMI 28.88 kg/m2  LMP 10/09/2010      FHT:  FHR: 140 bpm, variability: moderate,  accelerations:  Present,  decelerations:  Absent UC:   irregular, every 5 minutes SVE:   Dilation: Closed Effacement (%): 60 Station: -3 Exam by:: Lorretta Harp RNC  Labs: Lab Results  Component Value Date   WBC 11.3* 03/29/2012   HGB 13.5 03/29/2012   HCT 38.7 03/29/2012   MCV 89.0 03/29/2012   PLT 206 03/29/2012    Assessment / Plan: IOL secondary to oligohydramnios; s/p attempt to mechanically ripen; SROM  Labor: early labor Preeclampsia:  n/a Fetal Wellbeing:  Category I Pain Control:  Epidural I/D:  n/a Anticipated MOD:  NSVD  JACKSON-MOORE,Breyon Sigg A 03/29/2012, 9:05 PM

## 2012-03-29 NOTE — Telephone Encounter (Signed)
Preadmission screen  

## 2012-03-29 NOTE — H&P (Signed)
Brianna Palmer is Palmer 27 y.o. female presenting for rule out labor. Maternal Medical History:  Reason for admission: Contractions.  An U/S showed an AFI of 1.35. There is no h/o SROM.  Prenatal complications: no prenatal complications   OB History   Grav Para Term Preterm Abortions TAB SAB Ect Mult Living   4 0   3 1 2         Past Medical History  Diagnosis Date  . No pertinent past medical history    Past Surgical History  Procedure Laterality Date  . Bunionectomy    . No past surgeries     Family History: family history is negative for Other. She is adopted. Social History:  reports that she has been smoking Cigarettes.  She has been smoking about 0.25 packs per day. She has never used smokeless tobacco. She reports that she does not drink alcohol or use illicit drugs.     Review of Systems  Constitutional: Negative for fever.  Eyes: Negative for blurred vision.  Respiratory: Negative for shortness of breath.   Gastrointestinal: Negative for vomiting.  Skin: Negative for rash.  Neurological: Negative for headaches.    Dilation: Closed Effacement (%): 60 Station: -3 Exam by:: Brianna Palmer RNC Blood pressure 116/72, pulse 87, temperature 97.4 F (36.3 C), temperature source Oral, resp. rate 18, height 5\' 3"  (1.6 m), weight 163 lb (73.936 kg), last menstrual period 10/09/2010, unknown if currently breastfeeding. Maternal Exam:  Uterine Assessment: Contraction frequency is irregular.   Abdomen: not evaluated.  Introitus: not evaluated.   Cervix: Cervix evaluated by digital exam.     Fetal Exam Fetal Monitor Review: Variability: moderate (6-25 bpm).   Pattern: accelerations present and no decelerations.    Fetal State Assessment: Category I - tracings are normal.     Physical Exam  Constitutional: She appears well-developed.  HENT:  Head: Normocephalic.  Neck: Neck supple. No thyromegaly present.  Cardiovascular: Normal rate and regular rhythm.   Respiratory:  Breath sounds normal.  GI: Soft. Bowel sounds are normal.  Skin: No rash noted.    Prenatal labs: ABO, Rh: B/Positive/-- (07/17 0000) Antibody: Negative (07/17 0000) Rubella: Immune (07/17 0000) RPR: Nonreactive (07/17 0000)  HBsAg: Negative (07/17 0000)  HIV: Non-reactive (07/17 0000)  GBS: Negative (02/11 0000)   Assessment/Plan: Nullipara at [redacted]w[redacted]d.  Oligohydramnios.  Prodromal labor. Category I FHT  Admit Mechanical cervical ripening   Brianna Palmer 03/29/2012, 12:46 PM

## 2012-03-29 NOTE — Progress Notes (Signed)
Dr Tamela Oddi notified of pt's complaints, VE, FHR, and contractions, orders received.

## 2012-03-29 NOTE — Progress Notes (Signed)
Der Brianna Palmer notified of pts ultrasound results, orders received to admit pt

## 2012-03-30 ENCOUNTER — Encounter (HOSPITAL_COMMUNITY): Payer: Self-pay | Admitting: Anesthesiology

## 2012-03-30 ENCOUNTER — Inpatient Hospital Stay (HOSPITAL_COMMUNITY): Payer: Managed Care, Other (non HMO)

## 2012-03-30 ENCOUNTER — Inpatient Hospital Stay (HOSPITAL_COMMUNITY): Payer: Managed Care, Other (non HMO) | Admitting: Anesthesiology

## 2012-03-30 ENCOUNTER — Encounter (HOSPITAL_COMMUNITY)
Admission: AD | Disposition: A | Discharge status: Home / Self Care | Payer: Self-pay | Source: Ambulatory Visit | Attending: Obstetrics & Gynecology

## 2012-03-30 SURGERY — Surgical Case
Anesthesia: Epidural | Site: Abdomen | Wound class: Clean Contaminated

## 2012-03-30 MED ORDER — IBUPROFEN 600 MG PO TABS
600.0000 mg | ORAL_TABLET | Freq: Four times a day (QID) | ORAL | Status: DC
  Administered 2012-03-30 – 2012-04-02 (×12): 600 mg via ORAL
  Filled 2012-03-30 (×13): qty 1

## 2012-03-30 MED ORDER — LIDOCAINE-EPINEPHRINE (PF) 2 %-1:200000 IJ SOLN
INTRAMUSCULAR | Status: AC
  Filled 2012-03-30: qty 20

## 2012-03-30 MED ORDER — KETOROLAC TROMETHAMINE 60 MG/2ML IM SOLN
60.0000 mg | Freq: Once | INTRAMUSCULAR | Status: AC | PRN
  Administered 2012-03-30: 60 mg via INTRAMUSCULAR

## 2012-03-30 MED ORDER — CEFAZOLIN SODIUM-DEXTROSE 2-3 GM-% IV SOLR
INTRAVENOUS | Status: DC | PRN
  Administered 2012-03-30: 2 g via INTRAVENOUS

## 2012-03-30 MED ORDER — OXYTOCIN 10 UNIT/ML IJ SOLN
INTRAMUSCULAR | Status: AC
  Filled 2012-03-30: qty 4

## 2012-03-30 MED ORDER — MAGNESIUM HYDROXIDE 400 MG/5ML PO SUSP: 30.0000 mL | ORAL | Status: DC | PRN

## 2012-03-30 MED ORDER — NALOXONE HCL 1 MG/ML IJ SOLN
1.0000 ug/kg/h | INTRAVENOUS | Status: DC | PRN
  Filled 2012-03-30: qty 2

## 2012-03-30 MED ORDER — ONDANSETRON HCL 4 MG PO TABS: 4.0000 mg | ORAL_TABLET | ORAL | Status: DC | PRN

## 2012-03-30 MED ORDER — DIBUCAINE 1 % RE OINT: 1.0000 "application " | TOPICAL_OINTMENT | RECTAL | Status: DC | PRN

## 2012-03-30 MED ORDER — WITCH HAZEL-GLYCERIN EX PADS: 1.0000 "application " | MEDICATED_PAD | CUTANEOUS | Status: DC | PRN

## 2012-03-30 MED ORDER — ONDANSETRON HCL 4 MG/2ML IJ SOLN
INTRAMUSCULAR | Status: AC
  Filled 2012-03-30: qty 2

## 2012-03-30 MED ORDER — OXYTOCIN 10 UNIT/ML IJ SOLN
40.0000 [IU] | INTRAVENOUS | Status: DC | PRN
  Administered 2012-03-30: 40 [IU] via INTRAVENOUS

## 2012-03-30 MED ORDER — MEPERIDINE HCL 25 MG/ML IJ SOLN: 6.2500 mg | INTRAMUSCULAR | Status: DC | PRN

## 2012-03-30 MED ORDER — MEPERIDINE HCL 25 MG/ML IJ SOLN
INTRAMUSCULAR | Status: DC | PRN
  Administered 2012-03-30: 25 mg via INTRAVENOUS

## 2012-03-30 MED ORDER — KETOROLAC TROMETHAMINE 60 MG/2ML IM SOLN
INTRAMUSCULAR | Status: AC
  Filled 2012-03-30: qty 2

## 2012-03-30 MED ORDER — SIMETHICONE 80 MG PO CHEW
80.0000 mg | CHEWABLE_TABLET | ORAL | Status: DC | PRN
  Administered 2012-04-01: 80 mg via ORAL

## 2012-03-30 MED ORDER — LACTATED RINGERS IV SOLN
INTRAVENOUS | Status: DC
  Administered 2012-03-30: 09:00:00 via INTRAVENOUS

## 2012-03-30 MED ORDER — LACTATED RINGERS IV SOLN
INTRAVENOUS | Status: DC | PRN
  Administered 2012-03-30: 02:00:00 via INTRAVENOUS

## 2012-03-30 MED ORDER — MORPHINE SULFATE 0.5 MG/ML IJ SOLN
INTRAMUSCULAR | Status: AC
  Filled 2012-03-30: qty 10

## 2012-03-30 MED ORDER — SCOPOLAMINE 1 MG/3DAYS TD PT72
1.0000 | MEDICATED_PATCH | Freq: Once | TRANSDERMAL | Status: AC
  Administered 2012-03-30: 1.5 mg via TRANSDERMAL

## 2012-03-30 MED ORDER — PRENATAL MULTIVITAMIN CH
1.0000 | ORAL_TABLET | Freq: Every day | ORAL | Status: DC
  Administered 2012-03-30 – 2012-04-02 (×4): 1 via ORAL
  Filled 2012-03-30 (×4): qty 1

## 2012-03-30 MED ORDER — SODIUM CHLORIDE 0.9 % IJ SOLN: 3.0000 mL | INTRAMUSCULAR | Status: DC | PRN

## 2012-03-30 MED ORDER — KETOROLAC TROMETHAMINE 30 MG/ML IJ SOLN: 15.0000 mg | Freq: Once | INTRAMUSCULAR | Status: DC | PRN

## 2012-03-30 MED ORDER — MEDROXYPROGESTERONE ACETATE 150 MG/ML IM SUSP: 150.0000 mg | INTRAMUSCULAR | Status: DC | PRN

## 2012-03-30 MED ORDER — PROMETHAZINE HCL 25 MG/ML IJ SOLN: 6.2500 mg | INTRAMUSCULAR | Status: DC | PRN

## 2012-03-30 MED ORDER — SCOPOLAMINE 1 MG/3DAYS TD PT72
MEDICATED_PATCH | TRANSDERMAL | Status: AC
  Filled 2012-03-30: qty 1

## 2012-03-30 MED ORDER — SODIUM BICARBONATE 8.4 % IV SOLN
INTRAVENOUS | Status: AC
  Filled 2012-03-30: qty 50

## 2012-03-30 MED ORDER — NALBUPHINE SYRINGE 5 MG/0.5 ML
5.0000 mg | INJECTION | INTRAMUSCULAR | Status: DC | PRN
  Filled 2012-03-30: qty 1

## 2012-03-30 MED ORDER — 0.9 % SODIUM CHLORIDE (POUR BTL) OPTIME
TOPICAL | Status: DC | PRN
  Administered 2012-03-30 (×2): 1000 mL

## 2012-03-30 MED ORDER — TERBUTALINE SULFATE 1 MG/ML IJ SOLN
0.2500 mg | Freq: Once | INTRAMUSCULAR | Status: AC
  Administered 2012-03-30: 0.25 mg via SUBCUTANEOUS

## 2012-03-30 MED ORDER — MORPHINE SULFATE (PF) 0.5 MG/ML IJ SOLN
INTRAMUSCULAR | Status: DC | PRN
  Administered 2012-03-30: 4 mg via EPIDURAL
  Administered 2012-03-30: 1 mg via INTRAVENOUS

## 2012-03-30 MED ORDER — MEASLES, MUMPS & RUBELLA VAC ~~LOC~~ INJ
0.5000 mL | INJECTION | Freq: Once | SUBCUTANEOUS | Status: DC
  Filled 2012-03-30: qty 0.5

## 2012-03-30 MED ORDER — ZOLPIDEM TARTRATE 5 MG PO TABS: 5.0000 mg | ORAL_TABLET | Freq: Every evening | ORAL | Status: DC | PRN

## 2012-03-30 MED ORDER — CEFAZOLIN SODIUM-DEXTROSE 2-3 GM-% IV SOLR
INTRAVENOUS | Status: AC
  Filled 2012-03-30: qty 50

## 2012-03-30 MED ORDER — SODIUM BICARBONATE 8.4 % IV SOLN
INTRAVENOUS | Status: DC | PRN
  Administered 2012-03-30: 10 mL via EPIDURAL

## 2012-03-30 MED ORDER — SENNOSIDES-DOCUSATE SODIUM 8.6-50 MG PO TABS
2.0000 | ORAL_TABLET | Freq: Every day | ORAL | Status: DC
  Administered 2012-03-30 – 2012-04-01 (×3): 2 via ORAL

## 2012-03-30 MED ORDER — METOCLOPRAMIDE HCL 5 MG/ML IJ SOLN
10.0000 mg | Freq: Three times a day (TID) | INTRAMUSCULAR | Status: DC | PRN
  Administered 2012-03-30: 10 mg via INTRAVENOUS

## 2012-03-30 MED ORDER — OXYTOCIN 40 UNITS IN LACTATED RINGERS INFUSION - SIMPLE MED: 62.5000 mL/h | INTRAVENOUS | Status: AC

## 2012-03-30 MED ORDER — MEPERIDINE HCL 25 MG/ML IJ SOLN
INTRAMUSCULAR | Status: AC
  Filled 2012-03-30: qty 1

## 2012-03-30 MED ORDER — DIPHENHYDRAMINE HCL 50 MG/ML IJ SOLN: 25.0000 mg | INTRAMUSCULAR | Status: DC | PRN

## 2012-03-30 MED ORDER — DIPHENHYDRAMINE HCL 25 MG PO CAPS: 25.0000 mg | ORAL_CAPSULE | ORAL | Status: DC | PRN

## 2012-03-30 MED ORDER — KETOROLAC TROMETHAMINE 30 MG/ML IJ SOLN: 30.0000 mg | Freq: Four times a day (QID) | INTRAMUSCULAR | Status: AC | PRN

## 2012-03-30 MED ORDER — NALBUPHINE SYRINGE 5 MG/0.5 ML
5.0000 mg | INJECTION | INTRAMUSCULAR | Status: DC | PRN
  Administered 2012-03-30 (×2): 5 mg via INTRAVENOUS
  Filled 2012-03-30 (×2): qty 1

## 2012-03-30 MED ORDER — HYDROMORPHONE HCL PF 1 MG/ML IJ SOLN: 0.2500 mg | INTRAMUSCULAR | Status: DC | PRN

## 2012-03-30 MED ORDER — OXYCODONE-ACETAMINOPHEN 5-325 MG PO TABS
1.0000 | ORAL_TABLET | ORAL | Status: DC | PRN
  Administered 2012-03-30 – 2012-04-02 (×10): 1 via ORAL
  Filled 2012-03-30 (×10): qty 1

## 2012-03-30 MED ORDER — MEPERIDINE HCL 25 MG/ML IJ SOLN
6.2500 mg | INTRAMUSCULAR | Status: DC | PRN
  Administered 2012-03-30: 6.25 mg via INTRAVENOUS

## 2012-03-30 MED ORDER — METOCLOPRAMIDE HCL 5 MG/ML IJ SOLN
INTRAMUSCULAR | Status: AC
  Filled 2012-03-30: qty 2

## 2012-03-30 MED ORDER — ONDANSETRON HCL 4 MG/2ML IJ SOLN
INTRAMUSCULAR | Status: DC | PRN
  Administered 2012-03-30: 4 mg via INTRAVENOUS

## 2012-03-30 MED ORDER — TETANUS-DIPHTH-ACELL PERTUSSIS 5-2.5-18.5 LF-MCG/0.5 IM SUSP
0.5000 mL | Freq: Once | INTRAMUSCULAR | Status: AC
  Administered 2012-03-30: 0.5 mL via INTRAMUSCULAR

## 2012-03-30 MED ORDER — LANOLIN HYDROUS EX OINT: 1.0000 "application " | TOPICAL_OINTMENT | CUTANEOUS | Status: DC | PRN

## 2012-03-30 MED ORDER — ONDANSETRON HCL 4 MG/2ML IJ SOLN: 4.0000 mg | Freq: Three times a day (TID) | INTRAMUSCULAR | Status: DC | PRN

## 2012-03-30 MED ORDER — NALOXONE HCL 0.4 MG/ML IJ SOLN: 0.4000 mg | INTRAMUSCULAR | Status: DC | PRN

## 2012-03-30 MED ORDER — FERROUS SULFATE 325 (65 FE) MG PO TABS
325.0000 mg | ORAL_TABLET | Freq: Two times a day (BID) | ORAL | Status: DC
  Administered 2012-03-30 – 2012-04-02 (×6): 325 mg via ORAL
  Filled 2012-03-30 (×6): qty 1

## 2012-03-30 MED ORDER — ONDANSETRON HCL 4 MG/2ML IJ SOLN: 4.0000 mg | INTRAMUSCULAR | Status: DC | PRN

## 2012-03-30 MED ORDER — DIPHENHYDRAMINE HCL 50 MG/ML IJ SOLN: 12.5000 mg | INTRAMUSCULAR | Status: DC | PRN

## 2012-03-30 MED ORDER — DIPHENHYDRAMINE HCL 25 MG PO CAPS: 25.0000 mg | ORAL_CAPSULE | Freq: Four times a day (QID) | ORAL | Status: DC | PRN

## 2012-03-30 SURGICAL SUPPLY — 38 items
BENZOIN TINCTURE PRP APPL 2/3 (GAUZE/BANDAGES/DRESSINGS) ×2 IMPLANT
CANISTER WOUND CARE 500ML ATS (WOUND CARE) IMPLANT
CLOTH BEACON ORANGE TIMEOUT ST (SAFETY) ×2 IMPLANT
CONTAINER PREFILL 10% NBF 15ML (MISCELLANEOUS) IMPLANT
DRAPE LG THREE QUARTER DISP (DRAPES) ×2 IMPLANT
DRSG OPSITE POSTOP 4X10 (GAUZE/BANDAGES/DRESSINGS) ×2 IMPLANT
DRSG VAC ATS LRG SENSATRAC (GAUZE/BANDAGES/DRESSINGS) IMPLANT
DRSG VAC ATS MED SENSATRAC (GAUZE/BANDAGES/DRESSINGS) IMPLANT
DRSG VAC ATS SM SENSATRAC (GAUZE/BANDAGES/DRESSINGS) IMPLANT
DURAPREP 26ML APPLICATOR (WOUND CARE) ×2 IMPLANT
ELECT REM PT RETURN 9FT ADLT (ELECTROSURGICAL) ×2
ELECTRODE REM PT RTRN 9FT ADLT (ELECTROSURGICAL) ×1 IMPLANT
EXTRACTOR VACUUM M CUP 4 TUBE (SUCTIONS) IMPLANT
GLOVE BIO SURGEON STRL SZ 6.5 (GLOVE) ×2 IMPLANT
GOWN STRL REIN XL XLG (GOWN DISPOSABLE) ×4 IMPLANT
KIT ABG SYR 3ML LUER SLIP (SYRINGE) ×2 IMPLANT
NEEDLE HYPO 25X5/8 SAFETYGLIDE (NEEDLE) ×2 IMPLANT
NS IRRIG 1000ML POUR BTL (IV SOLUTION) ×4 IMPLANT
PACK C SECTION WH (CUSTOM PROCEDURE TRAY) ×2 IMPLANT
PAD OB MATERNITY 4.3X12.25 (PERSONAL CARE ITEMS) ×2 IMPLANT
RTRCTR C-SECT PINK 25CM LRG (MISCELLANEOUS) IMPLANT
SLEEVE SCD COMPRESS KNEE MED (MISCELLANEOUS) ×2 IMPLANT
STAPLER VISISTAT 35W (STAPLE) IMPLANT
STRIP CLOSURE SKIN 1/2X4 (GAUZE/BANDAGES/DRESSINGS) ×2 IMPLANT
SUT MNCRL 0 VIOLET CTX 36 (SUTURE) ×2 IMPLANT
SUT MNCRL AB 3-0 PS2 27 (SUTURE) ×2 IMPLANT
SUT MONOCRYL 0 CTX 36 (SUTURE) ×2
SUT PDS AB 0 CTX 36 PDP370T (SUTURE) ×2 IMPLANT
SUT PLAIN 0 NONE (SUTURE) IMPLANT
SUT VIC AB 0 CTXB 36 (SUTURE) ×4 IMPLANT
SUT VIC AB 2-0 CT1 (SUTURE) ×2 IMPLANT
SUT VIC AB 2-0 CT1 27 (SUTURE) ×1
SUT VIC AB 2-0 CT1 TAPERPNT 27 (SUTURE) ×1 IMPLANT
SUT VIC AB 2-0 SH 27 (SUTURE)
SUT VIC AB 2-0 SH 27XBRD (SUTURE) IMPLANT
TOWEL OR 17X24 6PK STRL BLUE (TOWEL DISPOSABLE) ×6 IMPLANT
TRAY FOLEY CATH 14FR (SET/KITS/TRAYS/PACK) IMPLANT
WATER STERILE IRR 1000ML POUR (IV SOLUTION) IMPLANT

## 2012-03-30 NOTE — Anesthesia Postprocedure Evaluation (Signed)
Anesthesia Post Note  Patient: Brianna Palmer  Procedure(s) Performed: Procedure(s) (LRB): CESAREAN SECTION (N/A)  Anesthesia type: Epidural  Patient location: PACU  Post pain: Pain level controlled  Post assessment: Post-op Vital signs reviewed  Last Vitals:  Filed Vitals:   03/30/12 0131  BP: 110/71  Pulse: 62  Temp:   Resp:     Post vital signs: Reviewed  Level of consciousness: awake  Complications: No apparent anesthesia complications

## 2012-03-30 NOTE — Transfer of Care (Signed)
Immediate Anesthesia Transfer of Care Note  Patient: Brianna Palmer  Procedure(s) Performed: Procedure(s) with comments: CESAREAN SECTION (N/A) - Primary Cesarean Section Delivery Baby Boy @ 0157, Apgars 8/9  Patient Location: PACU  Anesthesia Type:Epidural  Level of Consciousness: awake, alert , oriented and patient cooperative  Airway & Oxygen Therapy: Patient Spontanous Breathing  Post-op Assessment: Report given to PACU RN and Post -op Vital signs reviewed and stable  Post vital signs: Reviewed and stable  Complications: No apparent anesthesia complications

## 2012-03-30 NOTE — Progress Notes (Signed)
Patient ID: Shari Natt, female   DOB: [birth year], 27 y.o.   MRN: [id number] Jerra Huckeby is a 27 y.o. G4P0030 at [redacted]w[redacted]d by LMP admitted for induction of labor due to Low amniotic fluid..  Subjective: Comfortable  Objective: BP 98/61  Pulse 71  Temp(Src) 96.8 F (36 C) (Axillary)  Resp 18  Ht 5\' 3"  (1.6 m)  Wt 163 lb (73.936 kg)  BMI 28.88 kg/m2  SpO2 100%  LMP 10/09/2010   Total I/O In: -  Out: 300 [Urine:300]  FHT:  FHR: 140 bpm, variability: moderate,  accelerations:  Abscent,  decelerations:  Present moderate variable decelerations UC:   irregular, every 5 minutes SVE:   Dilation: 3 Effacement (%): 100 Station: -2 Exam by:: Dr. Tamela Oddi IUPC placed Labs: Lab Results  Component Value Date   WBC 11.3* 03/29/2012   HGB 13.5 03/29/2012   HCT 38.7 03/29/2012   MCV 89.0 03/29/2012   PLT 206 03/29/2012    Assessment / Plan: IOL secondary to oligohydramnios;  SROM FHT c/w intermittent cord compression Amnioinfusion Labor: early labor Preeclampsia:  n/a Fetal Wellbeing:  Category II Pain Control:  Epidural I/D:  n/a Anticipated MOD:  NSVD  JACKSON-MOORE,Colie Fugitt A 03/30/2012, 1:20 AM

## 2012-03-30 NOTE — Progress Notes (Signed)
Dr. Tamela Oddi called notified of FHR tracing with decels, notified of interventions,  and SVE. Will come to place IU[facility] for amnioinfusion

## 2012-03-30 NOTE — Op Note (Signed)
Cesarean Section Procedure Note   [name]   03/29/2012 - 03/30/2012  Indications: Fetal bradycardia   Pre-operative Diagnosis: IUP @ [redacted]w[redacted]d undergoing IOL for oligohydramnios, latent labor, fetal bradycardia   Post-operative Diagnosis: Same   Surgeon: Antionette Char A  Assistants: none  Anesthesia: epidural  Procedure Details:  The patient concurred with the proposed plan, giving informed consent. The patient was identified as Brianna Palmer and the procedure verified as C-Section Delivery.  The patient was draped and prepped in the usual sterile manner. A transverse incision was made and carried down through the subcutaneous tissue to the fascia. The fascial incision was made and extended transversely. The peritoneum was identified and entered. The peritoneal incision was extended bluntly. The utero-vesical peritoneal reflection was incised transversely.  A low transverse uterine incision was made. Delivered from cephalic presentation was a living newborn female infant(s). APGAR (1 MIN): 8   APGAR (5 MINS): 9   A cord ph was sent. The umbilical cord was clamped and cut cord. A sample was obtained for evaluation. The placenta was removed Intact and appeared normal.  The uterine incision was closed with running locked sutures of 1-0 Monocryl. A second imbricating layer of the same suture was placed.  Hemostasis was observed. The paracolic gutters were irrigated. The parieto peritoneum was closed in a running fashion with 2-0 Vicryl.  The fascia was then reapproximated with running sutures of 0 Vicryl.  The skin was closed with suture.  Instrument, sponge, and needle counts were correct prior the abdominal closure and were correct at the conclusion of the case.    Findings:  See above   Estimated Blood Loss: 800 ml  Total IV Fluids: per Anesthesiology  Urine Output: per Anesthesiology  Specimens: Placenta to pathology  Complications: no complications  Disposition: PACU -  hemodynamically stable.  Maternal Condition: stable   Baby condition / location:  nursery-stable    Signed: Surgeon(s): Antionette Char, MD

## 2012-03-30 NOTE — Anesthesia Postprocedure Evaluation (Signed)
  Anesthesia Post-op Note  Patient: Brianna Palmer  Procedure(s) Performed: Procedure(s) with comments: CESAREAN SECTION (N/A) - Primary Cesarean Section Delivery Baby Boy @ 0157, Apgars 8/9  Patient Location: Mother/Baby  Anesthesia Type:Epidural  Level of Consciousness: awake  Airway and Oxygen Therapy: Patient Spontanous Breathing  Post-op Pain: mild  Post-op Assessment: Patient's Cardiovascular Status Stable and Respiratory Function Stable  Post-op Vital Signs: stable  Complications: No apparent anesthesia complications

## 2012-03-30 NOTE — Progress Notes (Signed)
Patient ID: Brianna Palmer, female   DOB: 1985/11/23, 27 y.o.   MRN: [id number] CTSP w/prolonged deceleration x 7 min.  Preparations made for an emergent C/D.

## 2012-03-30 NOTE — Anesthesia Postprocedure Evaluation (Deleted)
Anesthesia Post Note  Patient: Brianna Palmer  Procedure(s) Performed: Procedure(s) (LRB): CESAREAN SECTION (N/A)  Anesthesia type: Epidural  Patient location: PACU  Post pain: Pain level controlled  Post assessment: Post-op Vital signs reviewed  Last Vitals:  Filed Vitals:   03/30/12 0131  BP: 110/71  Pulse: 62  Temp:   Resp:     Post vital signs: Reviewed  Level of consciousness: awake  Complications: No apparent anesthesia complications 

## 2012-03-30 NOTE — Progress Notes (Signed)
Dr. Tamela Oddi called notified of FHR tracing with prolong decelerations following amnioinfusion, she is on her way and states to go ahead and prep pt for c/s

## 2012-03-31 ENCOUNTER — Encounter (HOSPITAL_COMMUNITY): Payer: Self-pay | Admitting: Obstetrics & Gynecology

## 2012-03-31 LAB — CBC
HCT: 30 % — ABNORMAL LOW (ref 36.0–46.0)
MCH: 31 pg (ref 26.0–34.0)
MCV: 89.3 fL (ref 78.0–100.0)
Platelets: 181 10*3/uL (ref 150–400)
RDW: 16.1 % — ABNORMAL HIGH (ref 11.5–15.5)

## 2012-03-31 NOTE — Progress Notes (Signed)
Subjective: Postpartum Day 1: Cesarean Delivery Patient reports tolerating PO.    Objective: Vital signs in last 24 hours: Temp:  [97.8 F (36.6 C)-98.9 F (37.2 C)] 98.9 F (37.2 C) (03/20 2230) Pulse Rate:  [76-87] 87 (03/20 2230) Resp:  [18-20] 18 (03/20 2230) BP: (98-114)/(60-71) 114/71 mmHg (03/20 2230) SpO2:  [98 %-100 %] 98 % (03/20 2230)  Physical Exam:  General: alert and no distress Lochia: appropriate Uterine Fundus: firm Incision: healing well DVT Evaluation: No evidence of DVT seen on physical exam.   Recent Labs  03/29/12 1120 03/31/12 0645  HGB 13.5 10.4*  HCT 38.7 30.0*    Assessment/Plan: Status post Cesarean section. Doing well postoperatively.  Continue current care.  Brianna Palmer 03/31/2012, 11:30 AM

## 2012-04-01 NOTE — Progress Notes (Signed)
Subjective: Postpartum Day 2: Cesarean Delivery Patient reports tolerating PO, + flatus and no problems voiding.    Objective: Vital signs in last 24 hours: Temp:  [98.4 F (36.9 C)-98.6 F (37 C)] 98.6 F (37 C) (03/22 0600) Pulse Rate:  [76-99] 76 (03/22 0600) Resp:  [18] 18 (03/22 0600) BP: (111-117)/(63-73) 117/63 mmHg (03/22 0600)  Physical Exam:  General: alert and no distress Lochia: appropriate Uterine Fundus: firm Incision: healing well DVT Evaluation: No evidence of DVT seen on physical exam.   Recent Labs  03/29/12 1120 03/31/12 0645  HGB 13.5 10.4*  HCT 38.7 30.0*    Assessment/Plan: Status post Cesarean section. Doing well postoperatively.  Continue current care.  HARPER,CHARLES A 04/01/2012, 8:12 AM

## 2012-04-02 ENCOUNTER — Inpatient Hospital Stay (HOSPITAL_COMMUNITY): Admission: RE | Admit: 2012-04-02 | Payer: Managed Care, Other (non HMO) | Source: Ambulatory Visit

## 2012-04-02 MED ORDER — IBUPROFEN 600 MG PO TABS: 600.0000 mg | ORAL_TABLET | Freq: Four times a day (QID) | ORAL | Status: DC | PRN

## 2012-04-02 MED ORDER — OXYCODONE-ACETAMINOPHEN 5-325 MG PO TABS: 1.0000 | ORAL_TABLET | ORAL | Status: DC | PRN

## 2012-04-02 NOTE — Progress Notes (Signed)
Subjective: Postpartum Day 3: Cesarean Delivery Patient reports tolerating PO, + flatus, + BM and no problems voiding.    Objective: Vital signs in last 24 hours: Temp:  [98.3 F (36.8 C)-98.6 F (37 C)] 98.3 F (36.8 C) (03/22 1813) Pulse Rate:  [76-92] 92 (03/22 1813) Resp:  [18] 18 (03/22 1813) BP: (112-117)/(63-70) 112/70 mmHg (03/22 1813)  Physical Exam:  General: alert and no distress Lochia: appropriate Uterine Fundus: firm Incision: healing well DVT Evaluation: No evidence of DVT seen on physical exam.   Recent Labs  03/31/12 0645  HGB 10.4*  HCT 30.0*    Assessment/Plan: Status post Cesarean section. Doing well postoperatively.  Discharge home with standard precautions and return to clinic in 2 weeks.  HARPER,CHARLES A 04/02/2012, 5:23 AM

## 2012-04-02 NOTE — Discharge Summary (Signed)
Obstetric Discharge Summary Reason for Admission: onset of labor Prenatal Procedures: ultrasound Intrapartum Procedures: cesarean: low cervical, transverse Postpartum Procedures: none Complications-Operative and Postpartum: none Hemoglobin  Date Value Range Status  03/31/2012 10.4* 12.0 - 15.0 g/dL Final     REPEATED TO VERIFY     DELTA CHECK NOTED     HCT  Date Value Range Status  03/31/2012 30.0* 36.0 - 46.0 % Final    Physical Exam:  General: alert and no distress Lochia: appropriate Uterine Fundus: firm Incision: healing well DVT Evaluation: No evidence of DVT seen on physical exam.  Discharge Diagnoses: Term Pregnancy-delivered  Discharge Information: Date: 04/02/2012 Activity: pelvic rest Diet: routine Medications: PNV, Ibuprofen, Colace and Percocet Condition: stable Instructions: refer to practice specific booklet Discharge to: home Follow-up Information   Follow up with HARPER,CHARLES A, MD. Schedule an appointment as soon as possible for a visit in 2 weeks.   Contact information:   [address] [place] [address] [phone]       Newborn Data: Live born female  Birth Weight: 6 lb 3.8 oz (2830 g) APGAR: 8, 9  Home with mother.  HARPER,CHARLES A 04/02/2012, 5:34 AM

## 2012-04-13 ENCOUNTER — Ambulatory Visit (INDEPENDENT_AMBULATORY_CARE_PROVIDER_SITE_OTHER): Payer: Managed Care, Other (non HMO) | Admitting: Obstetrics

## 2012-04-13 ENCOUNTER — Encounter: Payer: Self-pay | Admitting: *Deleted

## 2012-04-13 ENCOUNTER — Encounter: Payer: Self-pay | Admitting: Obstetrics

## 2012-04-13 DIAGNOSIS — Z3009 Encounter for other general counseling and advice on contraception: Secondary | ICD-10-CM

## 2012-04-13 MED ORDER — NORETHINDRONE 0.35 MG PO TABS: 1.0000 | ORAL_TABLET | Freq: Every day | ORAL | Status: DC

## 2012-04-13 NOTE — Progress Notes (Signed)
  Subjective:     Brianna Palmer is a 27 y.o. female who presents for a postpartum visit. She is 2 weeks postpartum following a low cervical transverse Cesarean section. I have fully reviewed the prenatal and intrapartum course. The delivery was at 38 gestational weeks. Outcome: primary cesarean section, low transverse incision. Anesthesia: epidural. Postpartum course has been normal. Baby's course has been normal. Baby is feeding by both breast and bottle - formula unknown. Bleeding thin lochia. Bowel function is normal. Bladder function is normal. Patient is not sexually active. Contraception method is abstinence. Postpartum depression screening: negative.  The following portions of the patient's history were reviewed and updated as appropriate: allergies, current medications, past family history, past medical history, past social history, past surgical history and problem list.  Review of Systems A comprehensive review of systems was negative.   Objective:    BP 106/71  Pulse 80  Temp(Src) 97 F (36.1 C) (Oral)  Wt 145 lb (65.772 kg)  BMI 25.69 kg/m2  LMP 10/09/2010  Breastfeeding? Yes  General:  alert and no distress   Breasts:  inspection negative, no nipple discharge or bleeding, no masses or nodularity palpable  Lungs: not done  Heart:  not done  Abdomen: normal findings: soft, non-tender   Vulva:  normal  Vagina: normal vagina  Cervix:  no lesions  Corpus: normal size, contour, position, consistency, mobility, non-tender  Adnexa:  no mass, fullness, tenderness  Rectal Exam: Not performed.        Assessment:     Normal postpartum exam. Pap smear not done at today's visit.   Plan:    1. Contraception: abstinence 2. Wants to start Micronor. 3. Follow up in: 4 weeks or as needed.

## 2012-05-16 ENCOUNTER — Ambulatory Visit (INDEPENDENT_AMBULATORY_CARE_PROVIDER_SITE_OTHER): Payer: Managed Care, Other (non HMO) | Admitting: Obstetrics

## 2012-05-16 ENCOUNTER — Encounter: Payer: Self-pay | Admitting: Obstetrics

## 2012-05-16 DIAGNOSIS — Z3009 Encounter for other general counseling and advice on contraception: Secondary | ICD-10-CM

## 2012-05-16 DIAGNOSIS — Z3202 Encounter for pregnancy test, result negative: Secondary | ICD-10-CM

## 2012-05-16 NOTE — Patient Instructions (Addendum)
Ob/Gyn Hospitalist Postpartum visit     Interpreter: Not needed    Chief Complaint   Patient presents with    Postpartum Follow-up       Subjective:     Brianna Palmer is a 27 y.o. G2P1011 who presents for 6 wks postpartum visit    Pt reports doing well without problems.     Taking motrin/tylenol with food PRN:  no  Normal bowel and bladder function:  yes  Vaginal bleeding: none now  Feeding: Breastpumps and bottle feeding  Mood: well and happy  Sexually active since delivery: no  Support at home:  yes  Contraceptive Plans: Levonorgestrel IUD    Discussed various contraceptive measures including barrier methods (condoms), oral hormonal contraceptives (OCPs), injection methods (Depo Provera), transdermal methods (patch), intrauterine devices (Mirena/Paraguard), and implantable methods (Nexplanon). Patient opted for Mirena.  Will schedule for insertion.      Objective:     Vitals:    03/01/22 1554   BP: 109/65       - GENERAL APPEARANCE: alert, well appearing, in no apparent distress  - BREASTS: no masses noted, no significant tenderness, no palpable axillary nodes, no skin changes  - ABDOMEN POSTPARTUM: benign non-tender, without masses or organomegaly palpable  - EXTREMITIES: no redness or tenderness in the calves or thighs, no edema  - VAGINA POSTPARTUM: normal, well-healed, physiologic discharge, without lesions  - UTERUS POSTPARTUM: normal size, well involuted, firm, non-tender      Last pap: Under age    Assessment/Plan:     27 y.o. G2P1011 for postpartum visit after spontaneous vaginal delivery     Review the Delivery Report for details.      Antepartum complications: intrauterine growth restriction  Delivery complications: none  Postpartum course: uncomplicated    - Normal postpartum exam.    - Discussed that she can resume normal activity, including sexual activity, as desired    - EPDS = 3. Patient offered resources for postpartum  depression, discussed signs and symptoms and encouraged her to call clinic if any of these develop    - Patient encouraged to establish care with a PCP for continued medical care     Comfort Avovabey, CNM  [facility] CNM Hospitalist  [facility]

## 2012-05-16 NOTE — Progress Notes (Signed)
Subjective:     Brianna Palmer is a 27 y.o. female who presents for a postpartum visit. She is 5 weeks postpartum following a low cervical transverse Cesarean section. I have fully reviewed the prenatal and intrapartum course. The delivery was at 40.5 gestational weeks. Outcome: primary cesarean section, low transverse incision. Anesthesia: epidural. Postpartum course has been normal. Baby's course has been normal. Baby is feeding by breast. Bleeding no bleeding. Bowel function is normal. Bladder function is normal . Patient is sexually active. Contraception method is oral progesterone-only contraceptive. Postpartum depression screening: negative.  The following portions of the patient's history were reviewed and updated as appropriate: allergies, current medications, past family history, past medical history, past social history, past surgical history and problem list.   Review of Systems Pertinent items are noted in HPI.   Objective:    BP 101/61  Pulse 61  Temp(Src) 98.2 F (36.8 C)  Wt 133 lb (60.328 kg)  BMI 23.57 kg/m2  General:  alert and no distress   Breasts:  inspection negative, no nipple discharge or bleeding, no masses or nodularity palpable  Lungs: not examined  Heart:  not examined  Abdomen: normal findings: soft, non-tender   Vulva:  normal  Vagina: normal vagina  Cervix:  no lesions  Corpus: normal size, contour, position, consistency, mobility, non-tender  Adnexa:  no mass, fullness, tenderness  Rectal Exam: Not performed.        Assessment:     Normal postpartum exam. Pap smear not done at today's visit.   Plan:    1. Contraception: abstinence.  Contraceptive Options Discussed. 2. Wants Nexplanon 3. Follow up in: 1 week or as needed.  Nexplanon Insertion.

## 2012-05-22 ENCOUNTER — Ambulatory Visit (INDEPENDENT_AMBULATORY_CARE_PROVIDER_SITE_OTHER): Payer: Managed Care, Other (non HMO) | Admitting: Obstetrics

## 2012-05-22 VITALS — BP 110/69 | HR 86 | Wt 134.0 lb

## 2012-05-22 DIAGNOSIS — Z3202 Encounter for pregnancy test, result negative: Secondary | ICD-10-CM

## 2012-05-22 DIAGNOSIS — Z30017 Encounter for initial prescription of implantable subdermal contraceptive: Secondary | ICD-10-CM

## 2012-05-22 DIAGNOSIS — Z309 Encounter for contraceptive management, unspecified: Secondary | ICD-10-CM

## 2012-05-22 NOTE — Progress Notes (Signed)
NEXPLANON INSERTION NOTE  Date of LMP:   05/16/2012   Contraception used: OCP  Pregnancy test result:  Lab Results  Component Value Date   PREGTESTUR Negative 05/22/2012    Indications:  The patient desires contraception.  She understands risks, benefits, and alternatives to Implanon and would like to proceed.  Anesthesia:   Lidocaine 1% plain.  Procedure:  A time-out was performed confirming the procedure and the patient's allergy status.  The patient's non-dominant was identified as the left arm.  The protection cap was removed. While placing countertraction on the skin, the needle was inserted at a 30 degree angle.  The applicator was held horizontal to the skin; the skin was tented upward as the needle was introduced into the subdermal space.  While holding the applicator in place, the slider was unlocked. The Nexplanon was removed from the field.  The Nexplanon was palpated to ensure proper placement.  Complications: None  Instructions:  The patient was instructed to remove the dressing in 24 hours and that some bruising is to be expected.  She was advised to use over the counter analgesics as needed for any pain at the site.  She is to keep the area dry for 24 hours and to call if her hand or arm becomes cold, numb, or blue.  Return visit:  Return in 2 weeks

## 2012-05-23 ENCOUNTER — Encounter: Payer: Self-pay | Admitting: Obstetrics

## 2012-05-23 ENCOUNTER — Ambulatory Visit: Payer: Managed Care, Other (non HMO) | Admitting: Obstetrics

## 2012-06-08 ENCOUNTER — Encounter: Payer: Self-pay | Admitting: Obstetrics

## 2012-06-08 ENCOUNTER — Ambulatory Visit (INDEPENDENT_AMBULATORY_CARE_PROVIDER_SITE_OTHER): Payer: Managed Care, Other (non HMO) | Admitting: Obstetrics

## 2012-06-08 VITALS — BP 114/76 | HR 106 | Temp 97.3°F | Wt 129.0 lb

## 2012-06-08 DIAGNOSIS — F3289 Other specified depressive episodes: Secondary | ICD-10-CM

## 2012-06-08 DIAGNOSIS — O9270 Unspecified disorders of lactation: Secondary | ICD-10-CM

## 2012-06-08 DIAGNOSIS — O924 Hypogalactia: Secondary | ICD-10-CM

## 2012-06-08 DIAGNOSIS — F329 Major depressive disorder, single episode, unspecified: Secondary | ICD-10-CM

## 2012-06-08 HISTORY — DX: Other specified depressive episodes: F32.89

## 2012-06-08 HISTORY — DX: Major depressive disorder, single episode, unspecified: F32.9

## 2012-06-08 MED ORDER — METOCLOPRAMIDE HCL 5 MG/5ML PO SOLN: 10.0000 mg | Freq: Three times a day (TID) | ORAL | Status: DC

## 2012-06-08 NOTE — Patient Instructions (Addendum)
Lactation Postpartum Depression

## 2012-06-08 NOTE — Progress Notes (Signed)
Subjective:    Brianna Palmer is a 27 y.o. female who presents for contraception counseling follow up. The patient complains of abdominal cramping and increased vaginal bleeding today. The patient is not currently sexually active. Pertinent past medical history: current smoker and migraines.  Menstrual History: OB History   Grav Para Term Preterm Abortions TAB SAB Ect Mult Living   5 1 1  3 1 2   1       Menarche age: 31  Patient's last menstrual period was 06/01/2012.    The following portions of the patient's history were reviewed and updated as appropriate: allergies, current medications, past family history, past medical history, past social history, past surgical history and problem list.  Review of Systems Pertinent items are noted in HPI.   Objective:    No exam performed today, Consult only..   Assessment:    27 y.o., continuing Nexplanon, no contraindications.   Postpartum depression.  Stable.  Decreased milk production.    Plan:    All questions answered. Discussed healthy lifestyle modifications. Follow up as needed.  Reglan Rx to augment milk production.

## 2012-06-28 ENCOUNTER — Ambulatory Visit (HOSPITAL_COMMUNITY)
Admission: RE | Admit: 2012-06-28 | Discharge: 2012-06-28 | Disposition: A | Discharge status: Home / Self Care | Payer: Managed Care, Other (non HMO) | Source: Ambulatory Visit | Attending: Obstetrics | Admitting: Obstetrics

## 2012-06-28 NOTE — Lactation Note (Signed)
Infant Lactation Consultation Outpatient Visit Note  Patient Name: Triana Coover Date of Birth: 1985-06-24 Birth Weight:   Gestational Age at Delivery: Gestational Age: <None> Type of Delivery:   Breastfeeding History Frequency of Breastfeeding: Baby nurses q 3 hours Length of Feeding: 10-30 min Voids: Lots Stools: Lots  Supplementing / Method: Pumping:  Type of Pump:Has IT sales professional but doesn't like it- feels it doesn't have good suction   Frequency:  Volume:    Comments:    Consultation Evaluation: Baby is now 80 months old. Mom complains of pain with nursing on the upper part of nipple and areola. Has been hurting the entire time she has been breast feeding.   Initial Feeding Assessment: Pre-feed Weight: 12- 8  5670 Post-feed Weight: 12- 10.3 Amount Transferred: 2.3 oz Comments: Robert latched and nursed with lots of swallows noted. Baby loses suction occasionally- coming on and off the breast Especially when milk is flowing fast. Mom reports that baby only nurses on the left breast. Mom reports that it feels very awkward nursing on the right and now she doesn't make very much milk on that side. We did try to latch him on and he took a few sucks then off the breast. Encouraged mom to continue trying to latch him to that breast- when he wasn't very hungry to increase milk supply. Mom asking about upper lip- very tight and she reports that it is hard to get his lip turned out while nursing. Dr Sherral Hammers in to exam baby and she will make referral to ENT. Mom very pleased that she is being heard and getting the help she feels she needs. Manual pump given because mom likes it much better that Ameda and she has worn hers out that she got while here. States she will pump the right breast to increase milk supply. Mom very committed to breast feeding Molly Maduro. Baby has only gotten breast milk no formula even though it is painful to nurse. No further questions at present. Encouraged to call prn  and make another appointment if needed.      Total Breast milk Transferred this Visit: 2.3 oz Total Supplement Given:   Additional Interventions:   Follow-Up  With ENT    Pamelia Hoit 06/28/2012, 4:51 PM

## 2012-09-14 ENCOUNTER — Ambulatory Visit: Payer: Managed Care, Other (non HMO) | Admitting: Obstetrics

## 2013-11-05 ENCOUNTER — Ambulatory Visit: Payer: Managed Care, Other (non HMO) | Admitting: Obstetrics

## 2013-11-12 ENCOUNTER — Encounter: Payer: Self-pay | Admitting: Obstetrics

## 2014-07-02 ENCOUNTER — Ambulatory Visit (INDEPENDENT_AMBULATORY_CARE_PROVIDER_SITE_OTHER): Payer: BLUE CROSS/BLUE SHIELD | Admitting: Certified Nurse Midwife

## 2014-07-02 ENCOUNTER — Encounter: Payer: Self-pay | Admitting: Certified Nurse Midwife

## 2014-07-02 ENCOUNTER — Encounter: Payer: Self-pay | Admitting: Obstetrics

## 2014-07-02 VITALS — BP 121/77 | HR 82 | Temp 97.2°F | Ht 63.0 in | Wt 167.0 lb

## 2014-07-02 DIAGNOSIS — B373 Candidiasis of vulva and vagina: Secondary | ICD-10-CM | POA: Diagnosis not present

## 2014-07-02 DIAGNOSIS — Z113 Encounter for screening for infections with a predominantly sexual mode of transmission: Secondary | ICD-10-CM

## 2014-07-02 DIAGNOSIS — N760 Acute vaginitis: Secondary | ICD-10-CM | POA: Diagnosis not present

## 2014-07-02 DIAGNOSIS — B9689 Other specified bacterial agents as the cause of diseases classified elsewhere: Secondary | ICD-10-CM

## 2014-07-02 DIAGNOSIS — Z01419 Encounter for gynecological examination (general) (routine) without abnormal findings: Secondary | ICD-10-CM | POA: Diagnosis not present

## 2014-07-02 DIAGNOSIS — A499 Bacterial infection, unspecified: Secondary | ICD-10-CM

## 2014-07-02 DIAGNOSIS — B3731 Acute candidiasis of vulva and vagina: Secondary | ICD-10-CM

## 2014-07-02 LAB — COMPREHENSIVE METABOLIC PANEL
ALT: 16 U/L (ref 0–35)
AST: 21 U/L (ref 0–37)
Albumin: 4.5 g/dL (ref 3.5–5.2)
Alkaline Phosphatase: 70 U/L (ref 39–117)
BUN: 15 mg/dL (ref 6–23)
CO2: 24 mEq/L (ref 19–32)
CREATININE: 0.83 mg/dL (ref 0.50–1.10)
Calcium: 9.6 mg/dL (ref 8.4–10.5)
Chloride: 103 mEq/L (ref 96–112)
Glucose, Bld: 96 mg/dL (ref 70–99)
Potassium: 3.5 mEq/L (ref 3.5–5.3)
Sodium: 140 mEq/L (ref 135–145)
Total Bilirubin: 0.2 mg/dL (ref 0.2–1.2)
Total Protein: 7.9 g/dL (ref 6.0–8.3)

## 2014-07-02 LAB — CBC WITH DIFFERENTIAL/PLATELET
BASOS ABS: 0 10*3/uL (ref 0.0–0.1)
Basophils Relative: 0 % (ref 0–1)
EOS PCT: 3 % (ref 0–5)
Eosinophils Absolute: 0.3 10*3/uL (ref 0.0–0.7)
HCT: 39.8 % (ref 36.0–46.0)
Hemoglobin: 13.7 g/dL (ref 12.0–15.0)
LYMPHS ABS: 3.5 10*3/uL (ref 0.7–4.0)
Lymphocytes Relative: 36 % (ref 12–46)
MCH: 28.4 pg (ref 26.0–34.0)
MCHC: 34.4 g/dL (ref 30.0–36.0)
MCV: 82.4 fL (ref 78.0–100.0)
MONO ABS: 0.6 10*3/uL (ref 0.1–1.0)
MPV: 9 fL (ref 8.6–12.4)
Monocytes Relative: 6 % (ref 3–12)
NEUTROS ABS: 5.3 10*3/uL (ref 1.7–7.7)
Neutrophils Relative %: 55 % (ref 43–77)
Platelets: 339 10*3/uL (ref 150–400)
RBC: 4.83 MIL/uL (ref 3.87–5.11)
RDW: 14.3 % (ref 11.5–15.5)
WBC: 9.6 10*3/uL (ref 4.0–10.5)

## 2014-07-02 LAB — HDL CHOLESTEROL: HDL: 47 mg/dL (ref >=46)

## 2014-07-02 LAB — CHOLESTEROL, TOTAL: CHOLESTEROL: 177 mg/dL (ref 0–200)

## 2014-07-02 LAB — TSH: TSH: 0.806 u[IU]/mL (ref 0.350–4.500)

## 2014-07-02 LAB — TRIGLYCERIDES: Triglycerides: 123 mg/dL (ref <150)

## 2014-07-02 MED ORDER — TERCONAZOLE 0.8 % VA CREA: 1.0000 | TOPICAL_CREAM | Freq: Every day | VAGINAL | Status: DC

## 2014-07-02 MED ORDER — FLUCONAZOLE 100 MG PO TABS: 100.0000 mg | ORAL_TABLET | Freq: Once | ORAL | Status: DC

## 2014-07-02 MED ORDER — TINIDAZOLE 500 MG PO TABS: 2.0000 g | ORAL_TABLET | Freq: Every day | ORAL | Status: AC

## 2014-07-02 NOTE — Progress Notes (Signed)
Patient ID: Brianna Palmer, female   DOB: 11/27/1985, 29 y.o.   MRN: [id number]   Subjective:     Brianna Palmer is a 29 y.o. female here for a routine exam.  Current complaints: some abdominal cramping.  Employed.  Going through a divorce.  Desires to have Nexplanon removed d/t weight gain, cramping, depression, irregular spotting.  Desires to go back to using the [employer].  Not currently sexually active.      Personal health questionnaire:  Is patient Ashkenazi Jewish, have a family history of breast and/or ovarian cancer: no Is there a family history of uterine cancer diagnosed at age < 70, gastrointestinal cancer, urinary tract cancer, family member who is a Personnel officer syndrome-associated carrier: no Is the patient overweight and hypertensive, family history of diabetes, personal history of gestational diabetes, preeclampsia or PCOS: no Is patient over 59, have PCOS,  family history of premature CHD under age 22, diabetes, smoke, have hypertension or peripheral artery disease:  no At any time, has a partner hit, kicked or otherwise hurt or frightened you?: no Over the past 2 weeks, have you felt down, depressed or hopeless?: no Over the past 2 weeks, have you felt little interest or pleasure in doing things?:no   Gynecologic History No LMP recorded. Patient has had an implant. Contraception: Nexplanon Last Pap: unknown. Results were: normal according to the patient Last mammogram: N/A.   Obstetric History OB History  Gravida Para Term Preterm AB SAB TAB Ectopic Multiple Living  5 1 1  3 2 1   1     # Outcome Date GA Lbr Len/2nd Weight Sex Delivery Anes PTL Lv  5 Term 03/30/12 [redacted]w[redacted]d  6 lb 3.8 oz (2.83 kg) M CS-LVertical EPI  Y  4 SAB 2012          3 SAB 2012 [redacted]w[redacted]d         2 TAB 2007          1 Gravida               Past Medical History  Diagnosis Date  . No pertinent past medical history     Past Surgical History  Procedure Laterality Date  . Bunionectomy    . No past surgeries     . Cesarean section N/A 03/30/2012    Procedure: CESAREAN SECTION;  Surgeon: Antionette Char, MD;  Location: [facility];  Service: Obstetrics;  Laterality: N/A;  Primary Cesarean Section Delivery Baby Boy @ 0157, Apgars 8/9     Current outpatient prescriptions:  .  etonogestrel (IMPLANON) 68 MG IMPL implant, Inject 1 each into the skin once., Disp: , Rfl:  .  Linaclotide (LINZESS) 145 MCG CAPS capsule, Take by mouth., Disp: , Rfl:  .  fluconazole (DIFLUCAN) 100 MG tablet, Take 1 tablet (100 mg total) by mouth once. Repeat dose in 48-72 hour., Disp: 3 tablet, Rfl: 0 .  furosemide (LASIX) 20 MG tablet, Take 20 mg by mouth., Disp: , Rfl:  .  potassium chloride SA (K-DUR,KLOR-CON) 20 MEQ tablet, Take by mouth., Disp: , Rfl:  .  terconazole (TERAZOL 3) 0.8 % vaginal cream, Place 1 applicator vaginally at bedtime., Disp: 20 g, Rfl: 0 .  tinidazole (TINDAMAX) 500 MG tablet, Take 4 tablets (2,000 mg total) by mouth daily with breakfast., Disp: 12 tablet, Rfl: 0 No Known Allergies  History  Substance Use Topics  . Smoking status: Former Smoker -- 0.25 packs/day    Types: Cigarettes    Quit date: 07/01/2013  .  Smokeless tobacco: Never Used  . Alcohol Use: No    Family History  Problem Relation Age of Onset  . Adopted: Yes  . Other Neg Hx       Review of Systems  Constitutional: negative for fatigue and weight loss Respiratory: negative for cough and wheezing Cardiovascular: negative for chest pain, fatigue and palpitations Gastrointestinal: negative for abdominal pain and change in bowel habits Musculoskeletal:negative for myalgias Neurological: negative for gait problems and tremors Behavioral/Psych: negative for abusive relationship, depression Endocrine: negative for temperature intolerance   Genitourinary:negative for abnormal menstrual periods, genital lesions, hot flashes, sexual problems and vaginal discharge Integument/breast: negative for breast lump, breast tenderness, nipple  discharge and skin lesion(s)    Objective:       BP 121/77 mmHg  Pulse 82  Temp(Src) 97.2 F (36.2 C)  Ht  (1.6 m)  Wt 167 lb (75.751 kg)  BMI 29.59 kg/m2 General:   alert  Skin:   no rash or abnormalities  Lungs:   clear to auscultation bilaterally  Heart:   regular rate and rhythm, S1, S2 normal, no murmur, click, rub or gallop  Breasts:   normal without suspicious masses, skin or nipple changes or axillary nodes  Abdomen:  normal findings: no organomegaly, soft, non-tender and no hernia  Pelvis:  External genitalia: normal general appearance Urinary system: urethral meatus normal and bladder without fullness, nontender Vaginal: normal without tenderness, induration or masses. + white thick cottage cheese, + thin gray vaginal discharge Cervix: normal appearance Adnexa: normal bimanual exam Uterus: anteverted and non-tender, normal size   Lab Review Urine pregnancy test Labs reviewed yes Radiologic studies reviewed no  50% of 30 min visit spent on counseling and coordination of care.   Assessment:    Healthy female exam.   Depression r/t situational stress contraception counseling BV Vulvovaginal candidasis  Plan:    Education reviewed: calcium supplements, depression evaluation, low fat, low cholesterol diet, safe sex/STD prevention, self breast exams, skin cancer screening and weight bearing exercise. Contraception: Nexplanon. Follow up in: 1 day. for Nexplanon removal and start Nuva Ring vaginal inserts for contraception.   Meds ordered this encounter  Medications  . furosemide (LASIX) 20 MG tablet    Sig: Take 20 mg by mouth.  Marland Kitchen Linaclotide (LINZESS) 145 MCG CAPS capsule    Sig: Take by mouth.  . potassium chloride SA (K-DUR,KLOR-CON) 20 MEQ tablet    Sig: Take by mouth.  . fluconazole (DIFLUCAN) 100 MG tablet    Sig: Take 1 tablet (100 mg total) by mouth once. Repeat dose in 48-72 hour.    Dispense:  3 tablet    Refill:  0  . terconazole (TERAZOL  3) 0.8 % vaginal cream    Sig: Place 1 applicator vaginally at bedtime.    Dispense:  20 g    Refill:  0  . tinidazole (TINDAMAX) 500 MG tablet    Sig: Take 4 tablets (2,000 mg total) by mouth daily with breakfast.    Dispense:  12 tablet    Refill:  0   Orders Placed This Encounter  Procedures  . SureSwab, Vaginosis/Vaginitis Plus  . HIV antibody (with reflex)  . CBC with Differential/Platelet  . Comprehensive metabolic panel  . TSH  . Cholesterol, total  . Triglycerides  . HDL cholesterol  . Hepatitis B surface antigen  . RPR  . Hepatitis C antibody

## 2014-07-03 ENCOUNTER — Ambulatory Visit (INDEPENDENT_AMBULATORY_CARE_PROVIDER_SITE_OTHER): Payer: BLUE CROSS/BLUE SHIELD | Admitting: Certified Nurse Midwife

## 2014-07-03 VITALS — BP 125/86 | HR 85 | Temp 98.5°F | Wt 166.0 lb

## 2014-07-03 DIAGNOSIS — Z3049 Encounter for surveillance of other contraceptives: Secondary | ICD-10-CM | POA: Diagnosis not present

## 2014-07-03 LAB — PAP IG W/ RFLX HPV ASCU

## 2014-07-03 LAB — HIV ANTIBODY (ROUTINE TESTING W REFLEX): HIV: NONREACTIVE

## 2014-07-03 LAB — HEPATITIS B SURFACE ANTIGEN: Hepatitis B Surface Ag: NEGATIVE

## 2014-07-03 LAB — HEPATITIS C ANTIBODY: HCV Ab: NEGATIVE

## 2014-07-03 LAB — RPR

## 2014-07-03 MED ORDER — ETONOGESTREL-ETHINYL ESTRADIOL 0.12-0.015 MG/24HR VA RING: 1.0000 | VAGINAL_RING | VAGINAL | Status: DC

## 2014-07-03 MED ORDER — OB COMPLETE GOLD 27.5-1-200 MG PO CAPS: 1.0000 | ORAL_CAPSULE | Freq: Every day | ORAL | Status: DC

## 2014-07-03 NOTE — Progress Notes (Signed)
Patient ID: Brianna Palmer, female   DOB: [birth year], 29 y.o.   MRN: [id number]  Procedure Note Removal of Nexplanon  Patient had Nexplanon inserted in 05/23/12. Desires removal today d/t AUB with the device in, along with weight gain and brittle hair.  Desires to use Nuva Ring for birth control.  Not currently sexually active.  Preconception counseling given.     Reviewed risk and benefits of procedure. Alternative options discussed Patient reported understanding and agreed to continue.   The patient's left arm was palpated and the implant device located. The area was prepped with Betadinex3. The distal end of the device was palpated and 1 cc of 1% lidocaine with epinephrine was injected. A 2 mm incision was made. Any fibrotic tissue was carefully dissected away using blunt and/or sharp dissection. The device was removed in an intact manner. Steri-strips and a sterile dressing were applied to the incision.   Followup: The patient tolerated the procedure well without complications. Standard post-procedure care is explained and return precautions are given.  Patient plans NuvaRing vaginal inserts samples given along with PNV samples/coupon.  Orvilla Cornwall CNM

## 2014-07-05 LAB — SURESWAB, VAGINOSIS/VAGINITIS PLUS
Atopobium vaginae: NOT DETECTED Log (cells/mL)
C. albicans, DNA: NOT DETECTED
C. glabrata, DNA: NOT DETECTED
C. parapsilosis, DNA: NOT DETECTED
C. trachomatis RNA, TMA: NOT DETECTED
C. tropicalis, DNA: NOT DETECTED
LACTOBACILLUS SPECIES: 7.6 Log (cells/mL)
MEGASPHAERA SPECIES: NOT DETECTED Log (cells/mL)
N. GONORRHOEAE RNA, TMA: NOT DETECTED
T. vaginalis RNA, QL TMA: NOT DETECTED

## 2014-09-10 ENCOUNTER — Ambulatory Visit (INDEPENDENT_AMBULATORY_CARE_PROVIDER_SITE_OTHER): Payer: BLUE CROSS/BLUE SHIELD | Admitting: Certified Nurse Midwife

## 2014-09-10 ENCOUNTER — Telehealth: Payer: Self-pay | Admitting: Certified Nurse Midwife

## 2014-09-10 ENCOUNTER — Encounter: Payer: Self-pay | Admitting: Certified Nurse Midwife

## 2014-09-10 VITALS — BP 117/72 | HR 80 | Temp 98.6°F | Wt 168.0 lb

## 2014-09-10 DIAGNOSIS — B3731 Acute candidiasis of vulva and vagina: Secondary | ICD-10-CM

## 2014-09-10 DIAGNOSIS — B373 Candidiasis of vulva and vagina: Secondary | ICD-10-CM | POA: Diagnosis not present

## 2014-09-10 MED ORDER — FLUCONAZOLE 100 MG PO TABS: 100.0000 mg | ORAL_TABLET | Freq: Once | ORAL | Status: DC

## 2014-09-10 MED ORDER — TERCONAZOLE 0.8 % VA CREA: 1.0000 | TOPICAL_CREAM | Freq: Every day | VAGINAL | Status: DC

## 2014-09-10 NOTE — Progress Notes (Signed)
Patient ID: Brianna Palmer, female   DOB: 1985/07/11, 29 y.o.   MRN: [id number]   Chief Complaint  Patient presents with  . Vaginitis    HPI Brianna Palmer is a 29 y.o. female.  C/O vaginal itching and discharge for several days.   Denies any change in periods.  Lost? Her Nuva Ring this month, was using tampons. Encouraged panty liners/pads.  Just started taking Flagyl for BV, encouraged her to keep taking the Flagyl.   HPI  Past Medical History  Diagnosis Date  . No pertinent past medical history     Past Surgical History  Procedure Laterality Date  . Bunionectomy    . No past surgeries    . Cesarean section N/A 03/30/2012    Procedure: CESAREAN SECTION;  Surgeon: Antionette Char, MD;  Location: [facility];  Service: Obstetrics;  Laterality: N/A;  Primary Cesarean Section Delivery Baby Boy @ 0157, Apgars 8/9    Family History  Problem Relation Age of Onset  . Adopted: Yes  . Other Neg Hx     Social History Social History  Substance Use Topics  . Smoking status: Former Smoker -- 0.25 packs/day    Types: Cigarettes    Quit date: 07/01/2013  . Smokeless tobacco: Never Used  . Alcohol Use: No    No Known Allergies  Current Outpatient Prescriptions  Medication Sig Dispense Refill  . metroNIDAZOLE (FLAGYL) 500 MG tablet Take 500 mg by mouth 2 (two) times daily.    Marland Kitchen etonogestrel-ethinyl estradiol (NUVARING) 0.12-0.015 MG/24HR vaginal ring Place 1 each vaginally every 21 ( twenty-one) days. Insert 1 ring vaginally and leave in place for 3 weeks, then remove for one (1) week. (Patient not taking: Reported on 09/10/2014) 1 each 11  . fluconazole (DIFLUCAN) 100 MG tablet Take 1 tablet (100 mg total) by mouth once. Repeat dose in 48-72 hour. 3 tablet 1  . furosemide (LASIX) 20 MG tablet Take 20 mg by mouth.    Marland Kitchen Linaclotide (LINZESS) 145 MCG CAPS capsule Take by mouth.    . potassium chloride SA (K-DUR,KLOR-CON) 20 MEQ tablet Take by mouth.    . Prenat w/o A-FeCbn-Meth-FA-DHA (OB  COMPLETE GOLD) 27.5-1-200 MG CAPS Take 1 tablet by mouth daily. (Patient not taking: Reported on 09/10/2014) 30 capsule 12  . terconazole (TERAZOL 3) 0.8 % vaginal cream Place 1 applicator vaginally at bedtime. 20 g 1   No current facility-administered medications for this visit.    Review of Systems Review of Systems Constitutional: negative for fatigue and weight loss Respiratory: negative for cough and wheezing Cardiovascular: negative for chest pain, fatigue and palpitations Gastrointestinal: negative for abdominal pain and change in bowel habits Genitourinary:negative Integument/breast: negative for nipple discharge Musculoskeletal:negative for myalgias Neurological: negative for gait problems and tremors Behavioral/Psych: negative for abusive relationship, depression Endocrine: negative for temperature intolerance     Blood pressure 117/72, pulse 80, temperature 98.6 F (37 C), weight 168 lb (76.204 kg), currently breastfeeding.  Physical Exam Physical Exam General:   alert  Skin:   no rash or abnormalities  Lungs:   clear to auscultation bilaterally  Heart:   regular rate and rhythm, S1, S2 normal, no murmur, click, rub or gallop  Breasts:   Deferred  Abdomen:  normal findings: no organomegaly, soft, non-tender and no hernia  Pelvis:  External genitalia: normal general appearance Urinary system: urethral meatus normal and bladder without fullness, nontender Vaginal: normal without tenderness, induration or masses, + white chunky discharge Cervix: normal appearance Adnexa: normal bimanual exam  Uterus: anteverted and non-tender, normal size    50% of 15 min visit spent on counseling and coordination of care.   Data Reviewed Previous medical hx, labs, meds  Assessment     VVC  BV    Plan    Orders Placed This Encounter  Procedures  . SureSwab, Vaginosis/Vaginitis Plus   Meds ordered this encounter  Medications  . metroNIDAZOLE (FLAGYL) 500 MG tablet    Sig:  Take 500 mg by mouth 2 (two) times daily.  . fluconazole (DIFLUCAN) 100 MG tablet    Sig: Take 1 tablet (100 mg total) by mouth once. Repeat dose in 48-72 hour.    Dispense:  3 tablet    Refill:  1  . terconazole (TERAZOL 3) 0.8 % vaginal cream    Sig: Place 1 applicator vaginally at bedtime.    Dispense:  20 g    Refill:  1     Follow up as needed.

## 2014-09-11 NOTE — Telephone Encounter (Signed)
09/11/2014 - patient called and schedules appt. brm

## 2014-09-13 ENCOUNTER — Other Ambulatory Visit: Payer: Self-pay | Admitting: Certified Nurse Midwife

## 2014-09-13 LAB — SURESWAB, VAGINOSIS/VAGINITIS PLUS
Atopobium vaginae: NOT DETECTED Log (cells/mL)
BV CATEGORY: UNDETERMINED — AB
C. PARAPSILOSIS, DNA: NOT DETECTED
C. TROPICALIS, DNA: NOT DETECTED
C. albicans, DNA: NOT DETECTED
C. glabrata, DNA: NOT DETECTED
C. trachomatis RNA, TMA: NOT DETECTED
Gardnerella vaginalis: 6.6 Log (cells/mL)
LACTOBACILLUS SPECIES: 7.8 Log (cells/mL)
MEGASPHAERA SPECIES: NOT DETECTED Log (cells/mL)
N. gonorrhoeae RNA, TMA: NOT DETECTED
T. VAGINALIS RNA, QL TMA: NOT DETECTED

## 2015-01-13 ENCOUNTER — Encounter (HOSPITAL_COMMUNITY): Payer: Self-pay | Admitting: *Deleted

## 2015-01-13 ENCOUNTER — Inpatient Hospital Stay (HOSPITAL_COMMUNITY)
Admit: 2015-01-13 | Discharge: 2015-01-13 | Disposition: A | Discharge status: Home / Self Care | Payer: BLUE CROSS/BLUE SHIELD | Source: Ambulatory Visit | Attending: Obstetrics | Admitting: Obstetrics

## 2015-01-13 ENCOUNTER — Inpatient Hospital Stay (HOSPITAL_COMMUNITY): Payer: BLUE CROSS/BLUE SHIELD

## 2015-01-13 DIAGNOSIS — R1030 Lower abdominal pain, unspecified: Secondary | ICD-10-CM | POA: Diagnosis present

## 2015-01-13 DIAGNOSIS — R102 Pelvic and perineal pain: Secondary | ICD-10-CM | POA: Diagnosis not present

## 2015-01-13 DIAGNOSIS — Z3202 Encounter for pregnancy test, result negative: Secondary | ICD-10-CM

## 2015-01-13 DIAGNOSIS — Z87891 Personal history of nicotine dependence: Secondary | ICD-10-CM | POA: Insufficient documentation

## 2015-01-13 LAB — CBC WITH DIFFERENTIAL/PLATELET
Basophils Absolute: 0 10*3/uL (ref 0.0–0.1)
Basophils Relative: 0 %
EOS ABS: 0.2 10*3/uL (ref 0.0–0.7)
Eosinophils Relative: 2 %
HEMATOCRIT: 37.8 % (ref 36.0–46.0)
HEMOGLOBIN: 13 g/dL (ref 12.0–15.0)
LYMPHS ABS: 4.2 10*3/uL — AB (ref 0.7–4.0)
LYMPHS PCT: 48 %
MCH: 28.6 pg (ref 26.0–34.0)
MCHC: 34.4 g/dL (ref 30.0–36.0)
MCV: 83.1 fL (ref 78.0–100.0)
MONOS PCT: 5 %
Monocytes Absolute: 0.4 10*3/uL (ref 0.1–1.0)
NEUTROS ABS: 3.9 10*3/uL (ref 1.7–7.7)
NEUTROS PCT: 45 %
Platelets: 311 10*3/uL (ref 150–400)
RBC: 4.55 MIL/uL (ref 3.87–5.11)
RDW: 14.5 % (ref 11.5–15.5)
WBC: 8.6 10*3/uL (ref 4.0–10.5)

## 2015-01-13 LAB — URINALYSIS, ROUTINE W REFLEX MICROSCOPIC
BILIRUBIN URINE: NEGATIVE
GLUCOSE, UA: NEGATIVE mg/dL
Hgb urine dipstick: NEGATIVE
KETONES UR: 15 mg/dL — AB
LEUKOCYTES UA: NEGATIVE
NITRITE: NEGATIVE
PH: 7 (ref 5.0–8.0)
PROTEIN: NEGATIVE mg/dL
Specific Gravity, Urine: 1.02 (ref 1.005–1.030)

## 2015-01-13 LAB — WET PREP, GENITAL
Clue Cells Wet Prep HPF POC: NONE SEEN
SPERM: NONE SEEN
Trich, Wet Prep: NONE SEEN
WBC WET PREP: NONE SEEN
Yeast Wet Prep HPF POC: NONE SEEN

## 2015-01-13 LAB — POCT PREGNANCY, URINE: Preg Test, Ur: NEGATIVE

## 2015-01-13 NOTE — MAU Provider Note (Signed)
History     CSN: [id number]  Arrival date and time: 01/13/15 1611   First Provider Initiated Contact with Patient 01/13/15 1726      Chief Complaint  Patient presents with  . Abdominal Pain   HPI Brianna Palmer is 30 y.o. [phone] presents with report of lower abdominal pain X 1 week that worsened today. She is a patient of Dr. Verdell CarmineHarper's.   States it was a 9/10 earlier today, doubling her over.  Came in "waves".  She took 2 Ibuprofen without relief and then took Extra Strength Tylenol and pain lessened to 3/10.  Now she states she the cramping pain is soreness.   She is sexually active, 1 partner.  Had Nexplanon until June-removed for bleeding and weight gain.  Used Nuvaring until last month when it came out with her tampon and she couldn't find it.  That was worrisome for her.  She is not using contraception.  Pregnancy would be ok.  LMP 12/25/14.  Neg UPT here.     Past Medical History  Diagnosis Date  . No pertinent past medical history     Past Surgical History  Procedure Laterality Date  . Bunionectomy    . No past surgeries    . Cesarean section N/A 03/30/2012    Procedure: CESAREAN SECTION;  Surgeon: Antionette CharLisa Jackson-Moore, MD;  Location: [facility];  Service: Obstetrics;  Laterality: N/A;  Primary Cesarean Section Delivery Baby Boy @ 0157, Apgars 8/9    Family History  Problem Relation Age of Onset  . Adopted: Yes  . Other Neg Hx     Social History  Substance Use Topics  . Smoking status: Former Smoker -- 0.25 packs/day    Types: Cigarettes    Quit date: 07/01/2013  . Smokeless tobacco: Never Used  . Alcohol Use: No    Allergies: No Known Allergies  Prescriptions prior to admission  Medication Sig Dispense Refill Last Dose  . Hyprom-Naphaz-Polysorb-Zn Sulf (CLEAR EYES COMPLETE OP) Apply 1 drop to eye as needed (dryness).   Past Week at Unknown time  . Ibuprofen (MOTRIN PO) Take 1 tablet by mouth every 6 (six) hours as needed (pain).   01/12/2015 at Unknown time  .  etonogestrel-ethinyl estradiol (NUVARING) 0.12-0.015 MG/24HR vaginal ring Place 1 each vaginally every 21 ( twenty-one) days. Insert 1 ring vaginally and leave in place for 3 weeks, then remove for one (1) week. (Patient not taking: Reported on 01/13/2015) 1 each 11   . fluconazole (DIFLUCAN) 100 MG tablet Take 1 tablet (100 mg total) by mouth once. Repeat dose in 48-72 hour. (Patient not taking: Reported on 01/13/2015) 3 tablet 1   . Prenat w/o A-FeCbn-Meth-FA-DHA (OB COMPLETE GOLD) 27.5-1-200 MG CAPS Take 1 tablet by mouth daily. (Patient not taking: Reported on 09/10/2014) 30 capsule 12 More than a month at Unknown time  . terconazole (TERAZOL 3) 0.8 % vaginal cream Place 1 applicator vaginally at bedtime. (Patient not taking: Reported on 01/13/2015) 20 g 1 More than a month at Unknown time    Review of Systems  Constitutional: Negative for fever and chills.  Gastrointestinal: Positive for abdominal pain (lower abdominal pain that is intermittent, comes in waves. ). Negative for nausea, vomiting, diarrhea and constipation.  Genitourinary: Negative for dysuria, urgency, frequency and hematuria.       Negative for abnormal bleeding/ vaginal discharge.    Neurological: Negative for headaches.   Physical Exam   Blood pressure 114/73, pulse 79, temperature 97.4 F (36.3 C), temperature source Oral,  resp. rate 18, last menstrual period 12/25/2014, currently breastfeeding.  Physical Exam  Nursing note and vitals reviewed. Constitutional: She is oriented to person, place, and time. She appears well-developed and well-nourished. No distress.  HENT:  Head: Normocephalic.  Neck: Normal range of motion. Neck supple.  Cardiovascular: Normal rate.   Respiratory: Effort normal and breath sounds normal. No respiratory distress.  GI: Soft. She exhibits no mass. There is tenderness (mild tenderness lower abdomen.). There is no rebound and no guarding.  Genitourinary: There is no rash, tenderness, lesion or  injury on the right labia. There is no rash, tenderness, lesion or injury on the left labia. Uterus is not enlarged and not tender. Cervix exhibits no motion tenderness, no discharge and no friability. Right adnexum displays no mass, no tenderness and no fullness. Left adnexum displays no mass, no tenderness and no fullness. No bleeding in the vagina. Vaginal discharge: small amount of white discharge without odor.  Musculoskeletal: Normal range of motion. She exhibits no edema.  Neurological: She is alert and oriented to person, place, and time.  Skin: Skin is warm and dry.  Psychiatric: She has a normal mood and affect. Her behavior is normal. Thought content normal.   Results for orders placed or performed during the hospital encounter of 01/13/15 (from the past 24 hour(s))  Urinalysis, Routine w reflex microscopic (not at [facility])     Status: Abnormal   Collection Time: 01/13/15  4:30 PM  Result Value Ref Range   Color, Urine YELLOW YELLOW   APPearance CLEAR CLEAR   Specific Gravity, Urine 1.020 1.005 - 1.030   pH 7.0 5.0 - 8.0   Glucose, UA NEGATIVE NEGATIVE mg/dL   Hgb urine dipstick NEGATIVE NEGATIVE   Bilirubin Urine NEGATIVE NEGATIVE   Ketones, ur 15 (A) NEGATIVE mg/dL   Protein, ur NEGATIVE NEGATIVE mg/dL   Nitrite NEGATIVE NEGATIVE   Leukocytes, UA NEGATIVE NEGATIVE  Pregnancy, urine POC     Status: None   Collection Time: 01/13/15  5:26 PM  Result Value Ref Range   Preg Test, Ur NEGATIVE NEGATIVE  Wet prep, genital     Status: None   Collection Time: 01/13/15  5:45 PM  Result Value Ref Range   Yeast Wet Prep HPF POC NONE SEEN NONE SEEN   Trich, Wet Prep NONE SEEN NONE SEEN   Clue Cells Wet Prep HPF POC NONE SEEN NONE SEEN   WBC, Wet Prep HPF POC NONE SEEN NONE SEEN   Sperm NONE SEEN   CBC with Differential/Platelet     Status: Abnormal   Collection Time: 01/13/15  5:57 PM  Result Value Ref Range   WBC 8.6 4.0 - 10.5 K/uL   RBC 4.55 3.87 - 5.11 MIL/uL   Hemoglobin 13.0  12.0 - 15.0 g/dL   HCT 16.1 09.6 - 04.5 %   MCV 83.1 78.0 - 100.0 fL   MCH 28.6 26.0 - 34.0 pg   MCHC 34.4 30.0 - 36.0 g/dL   RDW 40.9 81.1 - 91.4 %   Platelets 311 150 - 400 K/uL   Neutrophils Relative % 45 %   Neutro Abs 3.9 1.7 - 7.7 K/uL   Lymphocytes Relative 48 %   Lymphs Abs 4.2 (H) 0.7 - 4.0 K/uL   Monocytes Relative 5 %   Monocytes Absolute 0.4 0.1 - 1.0 K/uL   Eosinophils Relative 2 %   Eosinophils Absolute 0.2 0.0 - 0.7 K/uL   Basophils Relative 0 %   Basophils Absolute 0.0 0.0 -  0.1 K/uL    US Transvaginal Non-ob  01/13/2015  CLINICAL DATA:  Pelvic pain in female. EXAM: TRANSABDOMINAL AND TRANSVAGINAL ULTRASOUND OF PELVIS TECHNIQUE: Both transabdominal and transvaginal ultrasound examinations of the pelvis were performed. Transabdominal technique was performed for global imaging of the pelvis including uterus, ovaries, adnexal regions, and pelvic cul-de-sac. It was necessary to proceed with endovaginal exam following the transabdominal exam to visualize the endometrium and ovaries. COMPARISON:  Ultrasound of August 19, 2011. FINDINGS: Uterus Measurements: 8.0 x 5.2 x 4.2 cm. No fibroids or other mass visualized. Endometrium Thickness: 7 mm which is within normal limits. No focal abnormality visualized. Right ovary Measurements: 2.8 x 2.3 x 1.4 cm. Normal appearance/no adnexal mass. Left ovary Measurements: 2.9 x 1.6 x 1.6 cm. Normal appearance/no adnexal mass. Other findings No abnormal free fluid. IMPRESSION: No abnormality seen in the pelvis. Electronically Signed   By: Lupita Raider, M.D.   On: 01/13/2015 18:54   US Pelvis Complete  01/13/2015  CLINICAL DATA:  Pelvic pain in female. EXAM: TRANSABDOMINAL AND TRANSVAGINAL ULTRASOUND OF PELVIS TECHNIQUE: Both transabdominal and transvaginal ultrasound examinations of the pelvis were performed. Transabdominal technique was performed for global imaging of the pelvis including uterus, ovaries, adnexal regions, and pelvic cul-de-sac. It  was necessary to proceed with endovaginal exam following the transabdominal exam to visualize the endometrium and ovaries. COMPARISON:  Ultrasound of August 19, 2011. FINDINGS: Uterus Measurements: 8.0 x 5.2 x 4.2 cm. No fibroids or other mass visualized. Endometrium Thickness: 7 mm which is within normal limits. No focal abnormality visualized. Right ovary Measurements: 2.8 x 2.3 x 1.4 cm. Normal appearance/no adnexal mass. Left ovary Measurements: 2.9 x 1.6 x 1.6 cm. Normal appearance/no adnexal mass. Other findings No abnormal free fluid. IMPRESSION: No abnormality seen in the pelvis. Electronically Signed   By: Lupita Raider, M.D.   On: 01/13/2015 18:54   MAU Course  Procedures  GC/CHL culture and HIV pending  MDM MSE Labs Exam U/S  Assessment and Plan  A;  Lower abdominal pain       No abnormality seen on U/S       Negative UPT  P:  Reviewed lab and U/S results with the patient.       Neg for findings       Call Dr. Clearance Coots for return/increase in pain   Edgard Debord,EVE M 01/13/2015, 7:03 PM

## 2015-01-13 NOTE — MAU Note (Signed)
Severe lower abd pain for the last week, is worse today, radiates into back.  Pt denies fever, vomiting or diarrhea.

## 2015-01-13 NOTE — Discharge Instructions (Signed)
Abdominal Pain, Adult Many things can cause belly (abdominal) pain. Most times, the belly pain is not dangerous. Many cases of belly pain can be watched and treated at home. HOME CARE   Do not take medicines that help you go poop (laxatives) unless told to by your doctor.  Only take medicine as told by your doctor.  Eat or drink as told by your doctor. Your doctor will tell you if you should be on a special diet. GET HELP IF:  You do not know what is causing your belly pain.  You have belly pain while you are sick to your stomach (nauseous) or have runny poop (diarrhea).  You have pain while you pee or poop.  Your belly pain wakes you up at night.  You have belly pain that gets worse or better when you eat.  You have belly pain that gets worse when you eat fatty foods.  You have a fever. GET HELP RIGHT AWAY IF:   The pain does not go away within 2 hours.  You keep throwing up (vomiting).  The pain changes and is only in the right or left part of the belly.  You have bloody or tarry looking poop. MAKE SURE YOU:   Understand these instructions.  Will watch your condition.  Will get help right away if you are not doing well or get worse.   This information is not intended to replace advice given to you by your health care provider. Make sure you discuss any questions you have with your health care provider.  YOU MAY TAKE TYLENOL PER PACKAGE INSERT    Document Released: 06/16/2007 Document Revised: 01/18/2014 Document Reviewed: 09/06/2012 Elsevier Interactive Patient Education [employer]2016 Elsevier Inc.

## 2015-01-14 LAB — HIV ANTIBODY (ROUTINE TESTING W REFLEX): HIV SCREEN 4TH GENERATION: NONREACTIVE

## 2015-01-14 LAB — GC/CHLAMYDIA PROBE AMP (~~LOC~~) NOT AT ARMC
Chlamydia: NEGATIVE
Neisseria Gonorrhea: NEGATIVE

## 2015-07-09 ENCOUNTER — Ambulatory Visit: Payer: BLUE CROSS/BLUE SHIELD | Admitting: Certified Nurse Midwife

## 2016-06-11 ENCOUNTER — Ambulatory Visit (INDEPENDENT_AMBULATORY_CARE_PROVIDER_SITE_OTHER): Payer: BLUE CROSS/BLUE SHIELD | Admitting: Certified Nurse Midwife

## 2016-06-11 ENCOUNTER — Encounter: Payer: Self-pay | Admitting: Certified Nurse Midwife

## 2016-06-11 VITALS — BP 127/84 | HR 83 | Ht 63.0 in | Wt 147.0 lb

## 2016-06-11 DIAGNOSIS — N898 Other specified noninflammatory disorders of vagina: Secondary | ICD-10-CM

## 2016-06-11 DIAGNOSIS — Z01419 Encounter for gynecological examination (general) (routine) without abnormal findings: Secondary | ICD-10-CM | POA: Diagnosis not present

## 2016-06-11 DIAGNOSIS — Z7251 High risk heterosexual behavior: Secondary | ICD-10-CM

## 2016-06-11 DIAGNOSIS — Z113 Encounter for screening for infections with a predominantly sexual mode of transmission: Secondary | ICD-10-CM

## 2016-06-11 DIAGNOSIS — Z124 Encounter for screening for malignant neoplasm of cervix: Secondary | ICD-10-CM | POA: Diagnosis not present

## 2016-06-11 NOTE — Progress Notes (Signed)
Subjective:        Brianna Palmer is a 31 y.o. female here for a routine exam.  Current complaints: Recent dx of Trich, 1 month ago desires retesting today.  Not currently using birth control, was on Nexplanon in the past and then [employer].   Nuva Ring fell out and she never found it.  Did not like Nexplanon and bleeding.     Personal health questionnaire:  Is patient Ashkenazi Jewish, have a family history of breast and/or ovarian cancer: no Is there a family history of uterine cancer diagnosed at age < 4250, gastrointestinal cancer, urinary tract cancer, family member who is a Personnel officerLynch syndrome-associated carrier: no Is the patient overweight and hypertensive, family history of diabetes, personal history of gestational diabetes, preeclampsia or PCOS: no Is patient over 5955, have PCOS,  family history of premature CHD under age 30, diabetes, smoke, have hypertension or peripheral artery disease:  no At any time, has a partner hit, kicked or otherwise hurt or frightened you?: no Over the past 2 weeks, have you felt down, depressed or hopeless?: no Over the past 2 weeks, have you felt little interest or pleasure in doing things?:no   Gynecologic History Patient's last menstrual period was 05/24/2016. Contraception: none Last Pap: 06/2014. Results were: normal Last mammogram: n/a <40.   Obstetric History OB History  Gravida Para Term Preterm AB Living  5 1 1   3 1   SAB TAB Ectopic Multiple Live Births  2 1     1     # Outcome Date GA Lbr Len/2nd Weight Sex Delivery Anes PTL Lv  5 Term 03/30/12 [id number]  6 lb 3.8 oz (2.83 kg) M CS-LVertical EPI  LIV  4 SAB 2012          3 SAB 2012 [id number]         2 TAB 2007          1 Gravida               Past Medical History:  Diagnosis Date  . No pertinent past medical history     Past Surgical History:  Procedure Laterality Date  . BUNIONECTOMY    . CESAREAN SECTION N/A 03/30/2012   Procedure: CESAREAN SECTION;  Surgeon: Antionette CharLisa Jackson-Moore, MD;   Location: [facility];  Service: Obstetrics;  Laterality: N/A;  Primary Cesarean Section Delivery Baby Boy @ 0157, Apgars 8/9  . NO PAST SURGERIES       Current Outpatient Prescriptions:  .  Ibuprofen (MOTRIN PO), Take 1 tablet by mouth every 6 (six) hours as needed (pain)., Disp: , Rfl:  .  Hyprom-Naphaz-Polysorb-Zn Sulf (CLEAR EYES COMPLETE OP), Apply 1 drop to eye as needed (dryness)., Disp: , Rfl:  .  Prenat-FeCbn-FeAspGl-FA-Omega (OB COMPLETE PETITE) 35-5-1-200 MG CAPS, Take 1 tablet by mouth daily., Disp: 30 capsule, Rfl: 12 No Known Allergies  Social History  Substance Use Topics  . Smoking status: Former Smoker    Packs/day: 0.25    Types: Cigarettes    Quit date: 07/01/2013  . Smokeless tobacco: Never Used  . Alcohol use No     Comment: socially    Family History  Problem Relation Age of Onset  . Adopted: Yes  . Other Neg Hx       Review of Systems  Constitutional: negative for fatigue and weight loss Respiratory: negative for cough and wheezing Cardiovascular: negative for chest pain, fatigue and palpitations Gastrointestinal: negative for abdominal pain and change in bowel habits  Musculoskeletal:negative for myalgias Neurological: negative for gait problems and tremors Behavioral/Psych: negative for abusive relationship, depression Endocrine: negative for temperature intolerance    Genitourinary:negative for abnormal menstrual periods, genital lesions, hot flashes, sexual problems and vaginal discharge Integument/breast: negative for breast lump, breast tenderness, nipple discharge and skin lesion(s)    Objective:       BP 127/84   Pulse 83   Ht 5\' 3"  (1.6 m)   Wt 147 lb (66.7 kg)   LMP 05/24/2016   BMI 26.04 kg/m  General:   alert  Skin:   no rash or abnormalities  Lungs:   clear to auscultation bilaterally  Heart:   regular rate and rhythm, S1, S2 normal, no murmur, click, rub or gallop  Breasts:   normal without suspicious masses, skin or nipple changes  or axillary nodes  Abdomen:  normal findings: no organomegaly, soft, non-tender and no hernia  Pelvis:  External genitalia: normal general appearance Urinary system: urethral meatus normal and bladder without fullness, nontender Vaginal: normal without tenderness, induration or masses Cervix: normal appearance Adnexa: normal bimanual exam Uterus: anteverted and non-tender, normal size   Lab Review Urine pregnancy test Labs reviewed yes Radiologic studies reviewed no  50% of 30 min visit spent on counseling and coordination of care.    Assessment:    Healthy female exam.    Plan:    Education reviewed: calcium supplements, depression evaluation, low fat, low cholesterol diet, safe sex/STD prevention, self breast exams, skin cancer screening and weight bearing exercise. Contraception: none. Follow up in: 1 year.   Meds ordered this encounter  Medications  . Prenat-FeCbn-FeAspGl-FA-Omega (OB COMPLETE PETITE) 35-5-1-200 MG CAPS    Sig: Take 1 tablet by mouth daily.    Dispense:  30 capsule    Refill:  12   Orders Placed This Encounter  Procedures  . STD Panel    Possible management options include: patch/IUD/Depo injections. Follow up as needed.

## 2016-06-11 NOTE — Progress Notes (Signed)
Last pap 06/2014 Normal

## 2016-06-14 ENCOUNTER — Other Ambulatory Visit: Payer: Self-pay | Admitting: Certified Nurse Midwife

## 2016-06-14 LAB — CYTOLOGY - PAP: DIAGNOSIS: NEGATIVE

## 2016-06-14 LAB — CERVICOVAGINAL ANCILLARY ONLY
Bacterial vaginitis: NEGATIVE
CANDIDA VAGINITIS: NEGATIVE
CHLAMYDIA, DNA PROBE: NEGATIVE
Neisseria Gonorrhea: NEGATIVE
TRICH (WINDOWPATH): NEGATIVE

## 2016-06-14 MED ORDER — OB COMPLETE PETITE 35-5-1-200 MG PO CAPS: 1.0000 | ORAL_CAPSULE | Freq: Every day | ORAL | 12 refills | Status: DC

## 2016-06-15 ENCOUNTER — Other Ambulatory Visit: Payer: Self-pay | Admitting: Certified Nurse Midwife

## 2016-06-15 LAB — RPR+HSVIGM+HBSAG+HSV2(IGG)+...
HEP B S AG: NEGATIVE
HIV SCREEN 4TH GENERATION: NONREACTIVE
HSV 2 GLYCOPROTEIN G AB, IGG: 8.11 {index} — AB (ref 0.00–0.90)
HSVI/II Comb IgM: 3.35 Ratio — ABNORMAL HIGH (ref 0.00–0.90)
RPR: NONREACTIVE

## 2016-06-16 ENCOUNTER — Other Ambulatory Visit: Payer: Self-pay | Admitting: Certified Nurse Midwife

## 2016-06-26 ENCOUNTER — Encounter (HOSPITAL_BASED_OUTPATIENT_CLINIC_OR_DEPARTMENT_OTHER): Payer: Self-pay | Admitting: *Deleted

## 2016-06-26 ENCOUNTER — Emergency Department (HOSPITAL_BASED_OUTPATIENT_CLINIC_OR_DEPARTMENT_OTHER)
Admission: EM | Admit: 2016-06-26 | Discharge: 2016-06-26 | Disposition: A | Discharge status: Home / Self Care | Payer: BLUE CROSS/BLUE SHIELD | Attending: Emergency Medicine | Admitting: Emergency Medicine

## 2016-06-26 ENCOUNTER — Emergency Department (HOSPITAL_BASED_OUTPATIENT_CLINIC_OR_DEPARTMENT_OTHER): Payer: BLUE CROSS/BLUE SHIELD

## 2016-06-26 DIAGNOSIS — M79672 Pain in left foot: Secondary | ICD-10-CM | POA: Insufficient documentation

## 2016-06-26 DIAGNOSIS — Z87891 Personal history of nicotine dependence: Secondary | ICD-10-CM | POA: Insufficient documentation

## 2016-06-26 DIAGNOSIS — Z79899 Other long term (current) drug therapy: Secondary | ICD-10-CM | POA: Insufficient documentation

## 2016-06-26 DIAGNOSIS — M545 Low back pain, unspecified: Secondary | ICD-10-CM

## 2016-06-26 DIAGNOSIS — M25552 Pain in left hip: Secondary | ICD-10-CM | POA: Insufficient documentation

## 2016-06-26 MED ORDER — METHOCARBAMOL 500 MG PO TABS: 500.0000 mg | ORAL_TABLET | Freq: Two times a day (BID) | ORAL | 0 refills | Status: DC

## 2016-06-26 MED ORDER — ACETAMINOPHEN 325 MG PO TABS
650.0000 mg | ORAL_TABLET | Freq: Once | ORAL | Status: AC
  Administered 2016-06-26: 650 mg via ORAL
  Filled 2016-06-26: qty 2

## 2016-06-26 MED ORDER — NAPROXEN 500 MG PO TABS: 500.0000 mg | ORAL_TABLET | Freq: Two times a day (BID) | ORAL | 0 refills | Status: DC

## 2016-06-26 NOTE — ED Notes (Signed)
ED Provider at bedside. 
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
## 2016-06-26 NOTE — ED Notes (Signed)
Patient transported to X-ray 
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
## 2016-06-26 NOTE — Discharge Instructions (Signed)
Take naproxen and Robaxin as needed for pain. You will likely be sore and stiff for the next several days. You may try the back stretches as provided in the paperwork. Follow-up with her primary care provider if you are having continuing soreness and stiffness in 1 week. Return to the ED if you experience new or worsening numbness or tingling of your hands, confusion, slurred speech, or vision changes.

## 2016-08-23 ENCOUNTER — Inpatient Hospital Stay (HOSPITAL_COMMUNITY)
Admission: AD | Admit: 2016-08-23 | Discharge: 2016-08-23 | Disposition: A | Discharge status: Home / Self Care | Payer: BLUE CROSS/BLUE SHIELD | Source: Ambulatory Visit | Attending: Obstetrics & Gynecology | Admitting: Obstetrics & Gynecology

## 2016-08-23 ENCOUNTER — Inpatient Hospital Stay (HOSPITAL_COMMUNITY): Payer: BLUE CROSS/BLUE SHIELD

## 2016-08-23 ENCOUNTER — Encounter (HOSPITAL_COMMUNITY): Payer: Self-pay | Admitting: *Deleted

## 2016-08-23 DIAGNOSIS — Z3A00 Weeks of gestation of pregnancy not specified: Secondary | ICD-10-CM | POA: Diagnosis not present

## 2016-08-23 DIAGNOSIS — R109 Unspecified abdominal pain: Secondary | ICD-10-CM | POA: Diagnosis not present

## 2016-08-23 DIAGNOSIS — O3680X0 Pregnancy with inconclusive fetal viability, not applicable or unspecified: Secondary | ICD-10-CM

## 2016-08-23 DIAGNOSIS — Z87891 Personal history of nicotine dependence: Secondary | ICD-10-CM | POA: Diagnosis not present

## 2016-08-23 DIAGNOSIS — O9989 Other specified diseases and conditions complicating pregnancy, childbirth and the puerperium: Secondary | ICD-10-CM

## 2016-08-23 DIAGNOSIS — R102 Pelvic and perineal pain: Secondary | ICD-10-CM | POA: Diagnosis present

## 2016-08-23 DIAGNOSIS — O26891 Other specified pregnancy related conditions, first trimester: Secondary | ICD-10-CM | POA: Diagnosis not present

## 2016-08-23 LAB — CBC
HCT: 37.4 % (ref 36.0–46.0)
Hemoglobin: 13.2 g/dL (ref 12.0–15.0)
MCH: 29.7 pg (ref 26.0–34.0)
MCHC: 35.3 g/dL (ref 30.0–36.0)
MCV: 84.2 fL (ref 78.0–100.0)
PLATELETS: 268 10*3/uL (ref 150–400)
RBC: 4.44 MIL/uL (ref 3.87–5.11)
RDW: 13.6 % (ref 11.5–15.5)
WBC: 8.3 10*3/uL (ref 4.0–10.5)

## 2016-08-23 LAB — URINALYSIS, ROUTINE W REFLEX MICROSCOPIC
Bilirubin Urine: NEGATIVE
Glucose, UA: NEGATIVE mg/dL
HGB URINE DIPSTICK: NEGATIVE
KETONES UR: NEGATIVE mg/dL
LEUKOCYTES UA: NEGATIVE
Nitrite: NEGATIVE
PROTEIN: NEGATIVE mg/dL
Specific Gravity, Urine: 1.011 (ref 1.005–1.030)
pH: 5 (ref 5.0–8.0)

## 2016-08-23 LAB — ABO/RH: ABO/RH(D): B POS

## 2016-08-23 LAB — HCG, QUANTITATIVE, PREGNANCY: HCG, BETA CHAIN, QUANT, S: 1359 m[IU]/mL — AB (ref <5)

## 2016-08-23 LAB — POCT PREGNANCY, URINE: Preg Test, Ur: POSITIVE — AB

## 2016-08-23 NOTE — MAU Provider Note (Signed)
History     CSN: [id number]  Arrival date and time: 08/23/16 1150   First Provider Initiated Contact with Patient 08/23/16 1330      Chief Complaint  Patient presents with  . Pelvic Pain   Pelvic Pain  The patient's primary symptoms include pelvic pain. The patient's pertinent negatives include no vaginal discharge. This is a new problem. The current episode started today. The problem occurs constantly. The problem has been unchanged. Pain severity now: 5/10 , worse with movement.  The problem affects the left side. She is pregnant. Associated symptoms include dysuria ("pressure") and nausea. Pertinent negatives include no chills, fever, frequency, urgency or vomiting. The vaginal discharge was normal. There has been no bleeding. The symptoms are aggravated by activity. She has tried nothing for the symptoms. Her menstrual history has been regular (LMP: 07/21/16 ).   Past Medical History:  Diagnosis Date  . No pertinent past medical history     Past Surgical History:  Procedure Laterality Date  . BUNIONECTOMY    . CESAREAN SECTION N/A 03/30/2012   Procedure: CESAREAN SECTION;  Surgeon: Antionette Char, MD;  Location: [facility];  Service: Obstetrics;  Laterality: N/A;  Primary Cesarean Section Delivery Baby Boy @ 0157, Apgars 8/9  . NO PAST SURGERIES      Family History  Problem Relation Age of Onset  . Adopted: Yes  . Other Neg Hx     Social History  Substance Use Topics  . Smoking status: Former Smoker    Packs/day: 0.25    Types: Cigarettes    Quit date: 07/01/2013  . Smokeless tobacco: Never Used  . Alcohol use No     Comment: socially    Allergies: No Known Allergies  Prescriptions Prior to Admission  Medication Sig Dispense Refill Last Dose  . Hyprom-Naphaz-Polysorb-Zn Sulf (CLEAR EYES COMPLETE OP) Apply 1 drop to eye as needed (dryness).   Not Taking  . Ibuprofen (MOTRIN PO) Take 1 tablet by mouth every 6 (six) hours as needed (pain).   Taking  . methocarbamol  (ROBAXIN) 500 MG tablet Take 1 tablet (500 mg total) by mouth 2 (two) times daily. 20 tablet 0   . naproxen (NAPROSYN) 500 MG tablet Take 1 tablet (500 mg total) by mouth 2 (two) times daily. 30 tablet 0   . Prenat-FeCbn-FeAspGl-FA-Omega (OB COMPLETE PETITE) 35-5-1-200 MG CAPS Take 1 tablet by mouth daily. 30 capsule 12   . trimethoprim-polymyxin b (POLYTRIM) ophthalmic solution every 4 (four) hours.       Review of Systems  Constitutional: Negative for chills and fever.  Gastrointestinal: Positive for nausea. Negative for vomiting.  Genitourinary: Positive for dysuria ("pressure") and pelvic pain. Negative for frequency, urgency, vaginal bleeding and vaginal discharge.   Physical Exam   Blood pressure 111/78, pulse 68, temperature (!) 97.5 F (36.4 C), temperature source Oral, resp. rate 16, height 5\' 3"  (1.6 m), weight 146 lb (66.2 kg), last menstrual period 07/21/2016, currently breastfeeding.  Physical Exam  Nursing note and vitals reviewed. Constitutional: She is oriented to person, place, and time. She appears well-developed and well-nourished.  HENT:  Head: Normocephalic.  Cardiovascular: Normal rate.   Respiratory: Effort normal.  GI: Soft. There is no tenderness. There is no rebound.  Musculoskeletal: Normal range of motion.  Neurological: She is alert and oriented to person, place, and time.  Skin: Skin is warm and dry.  Psychiatric: She has a normal mood and affect.    Results for orders placed or performed during the hospital  encounter of 08/23/16 (from the past 24 hour(s))  Urinalysis, Routine w reflex microscopic     Status: None   Collection Time: 08/23/16 12:37 PM  Result Value Ref Range   Color, Urine YELLOW YELLOW   APPearance CLEAR CLEAR   Specific Gravity, Urine 1.011 1.005 - 1.030   pH 5.0 5.0 - 8.0   Glucose, UA NEGATIVE NEGATIVE mg/dL   Hgb urine dipstick NEGATIVE NEGATIVE   Bilirubin Urine NEGATIVE NEGATIVE   Ketones, ur NEGATIVE NEGATIVE mg/dL    Protein, ur NEGATIVE NEGATIVE mg/dL   Nitrite NEGATIVE NEGATIVE   Leukocytes, UA NEGATIVE NEGATIVE  Pregnancy, urine POC     Status: Abnormal   Collection Time: 08/23/16  1:15 PM  Result Value Ref Range   Preg Test, Ur POSITIVE (A) NEGATIVE  CBC     Status: None   Collection Time: 08/23/16  1:49 PM  Result Value Ref Range   WBC 8.3 4.0 - 10.5 K/uL   RBC 4.44 3.87 - 5.11 MIL/uL   Hemoglobin 13.2 12.0 - 15.0 g/dL   HCT 16.1 09.6 - 04.5 %   MCV 84.2 78.0 - 100.0 fL   MCH 29.7 26.0 - 34.0 pg   MCHC 35.3 30.0 - 36.0 g/dL   RDW 40.9 81.1 - 91.4 %   Platelets 268 150 - 400 K/uL  hCG, quantitative, pregnancy     Status: Abnormal   Collection Time: 08/23/16  1:49 PM  Result Value Ref Range   hCG, Beta Chain, Quant, S 1,359 (H) <5 mIU/mL  ABO/Rh     Status: None   Collection Time: 08/23/16  1:49 PM  Result Value Ref Range   ABO/RH(D) B POS    US Ob Comp Less 14 Wks  Result Date: 08/23/2016 CLINICAL DATA:  Pelvic pain in pregnancy first trimester EXAM: OBSTETRIC <14 WK [place] AND TRANSVAGINAL OB US TECHNIQUE: Both transabdominal and transvaginal ultrasound examinations were performed for complete evaluation of the gestation as well as the maternal uterus, adnexal regions, and pelvic cul-de-sac. Transvaginal technique was performed to assess early pregnancy. COMPARISON:  None. FINDINGS: Intrauterine gestational sac: Not visualized Yolk sac:  Not visualized Embryo:  Not visualize Cardiac Activity: Heart Rate:   bpm MSD:   mm    w     d CRL:    mm    w    d                  [place] EDC: Subchorionic hemorrhage:  None visualized. Maternal uterus/adnexae: No adnexal mass or free fluid. IMPRESSION: No intrauterine pregnancy visualized. Differential considerations would include early intrauterine pregnancy too early to visualize, spontaneous abortion, or occult ectopic pregnancy. Recommend close clinical followup and serial quantitative beta HCGs and ultrasounds. Electronically Signed   By: Charlett Nose M.D.    On: 08/23/2016 15:22   US Ob Transvaginal  Result Date: 08/23/2016 CLINICAL DATA:  Pelvic pain in pregnancy first trimester EXAM: OBSTETRIC <14 WK [place] AND TRANSVAGINAL OB US TECHNIQUE: Both transabdominal and transvaginal ultrasound examinations were performed for complete evaluation of the gestation as well as the maternal uterus, adnexal regions, and pelvic cul-de-sac. Transvaginal technique was performed to assess early pregnancy. COMPARISON:  None. FINDINGS: Intrauterine gestational sac: Not visualized Yolk sac:  Not visualized Embryo:  Not visualize Cardiac Activity: Heart Rate:   bpm MSD:   mm    w     d CRL:    mm    w    d  US EDC: Subchorionic hemorrhage:  None visualized. Maternal uterus/adnexae: No adnexal mass or free fluid. IMPRESSION: No intrauterine pregnancy visualized. Differential considerations would include early intrauterine pregnancy too early to visualize, spontaneous abortion, or occult ectopic pregnancy. Recommend close clinical followup and serial quantitative beta HCGs and ultrasounds. Electronically Signed   By: Charlett NoseKevin  Dover M.D.   On: 08/23/2016 15:22   MAU Course  Procedures  MDM   Assessment and Plan   1. Pregnancy, location unknown    DC home Comfort measures reviewed  1st Trimester precautions  Bleeding precautions Ectopic precautions RX: none  Return to MAU as needed FU with OB as planned  Follow-up Information    [facility]   Specialty:  Obstetrics and Gynecology [facility]  [facility] 8[facility]00 FOR [facility]PEAT BLOOD WORK  Contact information: [address] [place] [id number] [phone]           Thressa ShellerHeather Hogan 08/23/2016, 1:31 PM

## 2016-08-23 NOTE — MAU Note (Signed)
Pt had pos HPT 3 days ago, C/O LLQ pain, is worse with walking, also vaginal pain with walking.  Denies bleeding.

## 2016-08-23 NOTE — MAU Note (Signed)
Urine in lab 

## 2016-08-23 NOTE — Discharge Instructions (Signed)
Ectopic Pregnancy An ectopic pregnancy is when the fertilized egg attaches (implants) outside the uterus. Most ectopic pregnancies occur in one of the tubes where eggs travel from the ovary to the uterus (fallopian tubes), but the implanting can occur in other locations. In rare cases, ectopic pregnancies occur on the ovary, intestine, pelvis, abdomen, or cervix. In an ectopic pregnancy, the fertilized egg does not have the ability to develop into a normal, healthy baby. A ruptured ectopic pregnancy is one in which tearing or bursting of a fallopian tube causes internal bleeding. Often, there is intense lower abdominal pain, and vaginal bleeding sometimes occurs. Having an ectopic pregnancy can be life-threatening. If this dangerous condition is not treated, it can lead to blood loss, shock, or even death. What are the causes? The most common cause of this condition is damage to one of the fallopian tubes. A fallopian tube may be narrowed or blocked, and that keeps the fertilized egg from reaching the uterus. What increases the risk? This condition is more likely to develop in women of childbearing age who have different levels of risk. The levels of risk can be divided into three categories. High risk  You have gone through infertility treatment.  You have had an ectopic pregnancy before.  You have had surgery on the fallopian tubes, or another surgical procedure, such as an abortion.  You have had surgery to have the fallopian tubes tied (tubal ligation).  You have problems or diseases of the fallopian tubes.  You have been exposed to diethylstilbestrol (DES). This medicine was used until 1971, and it had effects on babies whose mothers took the medicine.  You become pregnant while using an IUD (intrauterine device) for birth control. Moderate risk  You have a history of infertility.  You have had an STI (sexually transmitted infection).  You have a history of pelvic inflammatory  disease (PID).  You have scarring from endometriosis.  You have multiple sexual partners.  You smoke. Low risk  You have had pelvic surgery.  You use vaginal douches.  You became sexually active before age 18. What are the signs or symptoms? Common symptoms of this condition include normal pregnancy symptoms, such as missing a period, nausea, tiredness, abdominal pain, breast tenderness, and bleeding. However, ectopic pregnancy will have additional symptoms, such as:  Pain with intercourse.  Irregular vaginal bleeding or spotting.  Cramping or pain on one side or in the lower abdomen.  Fast heartbeat, low blood pressure, and sweating.  Passing out while having a bowel movement.  Symptoms of a ruptured ectopic pregnancy and internal bleeding may include:  Sudden, severe pain in the abdomen and pelvis.  Dizziness, weakness, light-headedness, or fainting.  Pain in the shoulder or neck area.  How is this diagnosed? This condition is diagnosed by:  A pelvic exam to locate pain or a mass in the abdomen.  A pregnancy test. This blood test checks for the presence as well as the specific level of pregnancy hormone in the bloodstream.  Ultrasound. This is performed if a pregnancy test is positive. In this test, a probe is inserted into the vagina. The probe will detect a fetus, possibly in a location other than the uterus.  Taking a sample of uterus tissue (dilation and curettage, or D&C).  Surgery to perform a visual exam of the inside of the abdomen using a thin, lighted tube that has a tiny camera on the end (laparoscope).  Culdocentesis. This procedure involves inserting a needle at the top   of the vagina, behind the uterus. If blood is present in this area, it may indicate that a fallopian tube is torn.  How is this treated? This condition is treated with medicine or surgery. Medicine  An injection of a medicine (methotrexate) may be given to cause the pregnancy tissue  to be absorbed. This medicine may save your fallopian tube. It may be given if: ? The diagnosis is made early, with no signs of active bleeding. ? The fallopian tube has not ruptured. ? You are considered to be a good candidate for the medicine. Usually, pregnancy hormone blood levels are checked after methotrexate treatment. This is to be sure that the medicine is effective. It may take 4-6 weeks for the pregnancy to be absorbed. Most pregnancies will be absorbed by 3 weeks. Surgery  A laparoscope may be used to remove the pregnancy tissue.  If severe internal bleeding occurs, a larger cut (incision) may be made in the lower abdomen (laparotomy) to remove the fetus and placenta. This is done to stop the bleeding.  Part or all of the fallopian tube may be removed (salpingectomy) along with the fetus and placenta. The fallopian tube may also be repaired during the surgery.  In very rare circumstances, removal of the uterus (hysterectomy) may be required.  After surgery, pregnancy hormone testing may be done to be sure that there is no pregnancy tissue left. Whether your treatment is medicine or surgery, you may receive a Rho (D) immune globulin shot to prevent problems with any future pregnancy. This shot may be given if:  You are Rh-negative and the baby's father is Rh-positive.  You are Rh-negative and you do not know the Rh type of the baby's father.  Follow these instructions at home:  Rest and limit your activity after the procedure for as long as told by your health care provider.  Until your health care provider says that it is safe: ? Do not lift anything that is heavier than 10 lb (4.5 kg), or the limit that your health care provider tells you. ? Avoid physical exercise and any movement that requires effort (is strenuous).  To help prevent constipation: ? Eat a healthy diet that includes fruits, vegetables, and whole grains. ? Drink 6-8 glasses of water per day. Get help  right away if:  You develop worsening pain that is not relieved by medicine.  You have: ? A fever or chills. ? Vaginal bleeding. ? Redness and swelling at the incision site. ? Nausea and vomiting.  You feel dizzy or weak.  You feel light-headed or you faint. This information is not intended to replace advice given to you by your health care provider. Make sure you discuss any questions you have with your health care provider. Document Released: 02/05/2004 Document Revised: 08/27/2015 Document Reviewed: 07/30/2015 Elsevier Interactive Patient Education  2018 Elsevier Inc.  

## 2016-08-26 ENCOUNTER — Ambulatory Visit: Payer: BLUE CROSS/BLUE SHIELD | Admitting: *Deleted

## 2016-08-26 ENCOUNTER — Encounter: Payer: Self-pay | Admitting: Family Medicine

## 2016-08-26 DIAGNOSIS — O3680X0 Pregnancy with inconclusive fetal viability, not applicable or unspecified: Secondary | ICD-10-CM

## 2016-08-26 LAB — HCG, QUANTITATIVE, PREGNANCY: hCG, Beta Chain, Quant, S: 3511 m[IU]/mL — ABNORMAL HIGH (ref <5)

## 2016-08-26 NOTE — Progress Notes (Signed)
Pt in for stat hcg level. She has a pregnancy of unknown anatomic location. She reports no pain and no bleeding today. Patient is waiting for results. Beta on 8/13 1359. Discussed results with Dr. Erin FullingHarraway Smith she recommends that pt have an ultrasound in 1 week. Scheduled for 08/23 at 1pm. Called patient and gave her results. She voiced understanding and had no further questions or concerns.

## 2016-08-28 ENCOUNTER — Inpatient Hospital Stay (HOSPITAL_COMMUNITY)
Admission: AD | Admit: 2016-08-28 | Discharge: 2016-08-28 | Disposition: A | Discharge status: Home / Self Care | Payer: BLUE CROSS/BLUE SHIELD | Source: Ambulatory Visit | Attending: Obstetrics and Gynecology | Admitting: Obstetrics and Gynecology

## 2016-08-28 ENCOUNTER — Inpatient Hospital Stay (HOSPITAL_COMMUNITY): Payer: BLUE CROSS/BLUE SHIELD

## 2016-08-28 ENCOUNTER — Encounter (HOSPITAL_COMMUNITY): Payer: Self-pay | Admitting: *Deleted

## 2016-08-28 DIAGNOSIS — Z87891 Personal history of nicotine dependence: Secondary | ICD-10-CM | POA: Insufficient documentation

## 2016-08-28 DIAGNOSIS — O209 Hemorrhage in early pregnancy, unspecified: Secondary | ICD-10-CM | POA: Diagnosis not present

## 2016-08-28 DIAGNOSIS — O4691 Antepartum hemorrhage, unspecified, first trimester: Secondary | ICD-10-CM | POA: Diagnosis present

## 2016-08-28 DIAGNOSIS — O26851 Spotting complicating pregnancy, first trimester: Secondary | ICD-10-CM

## 2016-08-28 DIAGNOSIS — Z3A01 Less than 8 weeks gestation of pregnancy: Secondary | ICD-10-CM | POA: Insufficient documentation

## 2016-08-28 LAB — HCG, QUANTITATIVE, PREGNANCY: hCG, Beta Chain, Quant, S: 7504 m[IU]/mL — ABNORMAL HIGH (ref <5)

## 2016-08-28 LAB — WET PREP, GENITAL
SPERM: NONE SEEN
TRICH WET PREP: NONE SEEN
YEAST WET PREP: NONE SEEN

## 2016-08-28 NOTE — Discharge Instructions (Signed)
Vaginal Bleeding During Pregnancy, First Trimester °A small amount of bleeding (spotting) from the vagina is common in early pregnancy. Sometimes the bleeding is normal and is not a problem, and sometimes it is a sign of something serious. Be sure to tell your doctor about any bleeding from your vagina right away. °Follow these instructions at home: °· Watch your condition for any changes. °· Follow your doctor's instructions about how active you can be. °· If you are on bed rest: °? You may need to stay in bed and only get up to use the bathroom. °? You may be allowed to do some activities. °? If you need help, make plans for someone to help you. °· Write down: °? The number of pads you use each day. °? How often you change pads. °? How soaked (saturated) your pads are. °· Do not use tampons. °· Do not douche. °· Do not have sex or orgasms until your doctor says it is okay. °· If you pass any tissue from your vagina, save the tissue so you can show it to your doctor. °· Only take medicines as told by your doctor. °· Do not take aspirin because it can make you bleed. °· Keep all follow-up visits as told by your doctor. °Contact a doctor if: °· You bleed from your vagina. °· You have cramps. °· You have labor pains. °· You have a fever that does not go away after you take medicine. °Get help right away if: °· You have very bad cramps in your back or belly (abdomen). °· You pass large clots or tissue from your vagina. °· You bleed more. °· You feel light-headed or weak. °· You pass out (faint). °· You have chills. °· You are leaking fluid or have a gush of fluid from your vagina. °· You pass out while pooping (having a bowel movement). °This information is not intended to replace advice given to you by your health care provider. Make sure you discuss any questions you have with your health care provider. °Document Released: 05/14/2013 Document Revised: 06/05/2015 Document Reviewed: 09/04/2012 °Elsevier Interactive  Patient Education © 2018 Elsevier Inc. ° °

## 2016-08-28 NOTE — MAU Provider Note (Signed)
History     CSN: [id number]  Arrival date and time: 08/28/16 1456   First Provider Initiated Contact with Patient 08/28/16 1517      Chief Complaint  Patient presents with  . Abdominal Pain  . Vaginal Bleeding   Brianna Palmer is a 31 y.o. [id number] at [redacted]w[redacted]d presenting with first episode of vaginal bleeding. She reports light pink spotting mixed with white discharge that began this morning. Not enough to wear a pad. She is also had more than a day of left suprapubic pain which is sharp and constant. Pain radiates to her low back. No treatment. She's had previous crampy pain and was evaluated here on 06/23/2016 for same. She was diagnosed with pregnancy of unknown anatomic location and had appropriate rise in quantitative beta hCG done 2 days ago. Follow-up was to be repeat ultrasound 09/02/2016. Blood type B positive.     OB History  Gravida Para Term Preterm AB Living  6 1 1   3 1   SAB TAB Ectopic Multiple Live Births  2 1     1     # Outcome Date GA Lbr Len/2nd Weight Sex Delivery Anes PTL Lv  6 Current           5 Term 03/30/12 [redacted]w[redacted]d  6 lb 3.8 oz (2.83 kg) M CS-LVertical EPI  LIV  4 SAB 2012          3 SAB 2012 [redacted]w[redacted]d         2 TAB 2007          1 Gravida                Past Medical History:  Diagnosis Date  . No pertinent past medical history     Past Surgical History:  Procedure Laterality Date  . BUNIONECTOMY    . CESAREAN SECTION N/A 03/30/2012   Procedure: CESAREAN SECTION;  Surgeon: Antionette Char, MD;  Location: [facility];  Service: Obstetrics;  Laterality: N/A;  Primary Cesarean Section Delivery Baby Boy @ 0157, Apgars 8/9  . NO PAST SURGERIES      Family History  Problem Relation Age of Onset  . Adopted: Yes  . Other Neg Hx     Social History  Substance Use Topics  . Smoking status: Former Smoker    Packs/day: 0.25    Types: Cigarettes    Quit date: 07/01/2013  . Smokeless tobacco: Never Used  . Alcohol use No     Comment: socially    Allergies:  No Known Allergies  Prescriptions Prior to Admission  Medication Sig Dispense Refill Last Dose  . methocarbamol (ROBAXIN) 500 MG tablet Take 1 tablet (500 mg total) by mouth 2 (two) times daily. 20 tablet 0 Past Month at Unknown time  . Polyethyl Glycol-Propyl Glycol (SYSTANE) 0.4-0.3 % SOLN Place 1 drop into both eyes daily.   Past Week at Unknown time  . Prenat-FeCbn-FeAspGl-FA-Omega (OB COMPLETE PETITE) 35-5-1-200 MG CAPS Take 1 tablet by mouth daily. 30 capsule 12 08/27/2016 at Unknown time  . Probiotic Product (PROBIOTIC PO) Take 1 capsule by mouth daily.   Past Week at Unknown time  . naproxen (NAPROSYN) 500 MG tablet Take 1 tablet (500 mg total) by mouth 2 (two) times daily. (Patient not taking: Reported on 08/28/2016) 30 tablet 0 Not Taking at Unknown time    Review of Systems  Constitutional: Negative for chills and fever.  Gastrointestinal: Positive for abdominal pain and nausea. Negative for vomiting.  Genitourinary: Positive for vaginal bleeding.  Negative for dyspareunia, dysuria, flank pain, genital sores, hematuria, urgency and vaginal pain.  Neurological: Negative for dizziness.  Psychiatric/Behavioral: The patient is not nervous/anxious.    Physical Exam   Blood pressure 115/71, pulse 86, temperature 98.1 F (36.7 C), resp. rate 18, weight 147 lb (66.7 kg), last menstrual period 07/21/2016, currently breastfeeding.  Physical Exam  Nursing note and vitals reviewed. Constitutional: She is oriented to person, place, and time. She appears well-developed and well-nourished. No distress.  HENT:  Head: Normocephalic.  Eyes: No scleral icterus.  Neck: Neck supple.  Cardiovascular: Normal rate.   Respiratory: Effort normal.  GI: Soft. There is tenderness. There is guarding.  Suprapubic tenderness, marked on left with some guarding, no rebound. No upper abdominal tenderness  Genitourinary:  Genitourinary Comments: NEFG Spec:  Vagina with scant white discharge; no blood  seen Cx clean  Bimanual: Cx post, mobile; Uterus retroverted; TTP left adnexa with no mass noted   Musculoskeletal: Normal range of motion.  Neurological: She is alert and oriented to person, place, and time.  Skin: Skin is warm and dry.  Psychiatric: She has a normal mood and affect.    MAU Course  Procedures Results for orders placed or performed during the hospital encounter of 08/28/16 (from the past 24 hour(s))  Wet prep, genital     Status: Abnormal   Collection Time: 08/28/16  4:05 PM  Result Value Ref Range   Yeast Wet Prep HPF POC NONE SEEN NONE SEEN   Trich, Wet Prep NONE SEEN NONE SEEN   Clue Cells Wet Prep HPF POC PRESENT (A) NONE SEEN   WBC, Wet Prep HPF POC FEW (A) NONE SEEN   Sperm NONE SEEN   hCG, quantitative, pregnancy     Status: Abnormal   Collection Time: 08/28/16  4:53 PM  Result Value Ref Range   hCG, Beta Chain, Quant, S 7,504 (H) <5 mIU/mL   Quant hCG 08/23/16: 1359> 08/26/16: 3511 US Ob Transvaginal  Result Date: 08/28/2016 CLINICAL DATA:  Bleeding in early pregnancy. EXAM: TRANSVAGINAL OB ULTRASOUND TECHNIQUE: Transvaginal ultrasound was performed for complete evaluation of the gestation as well as the maternal uterus, adnexal regions, and pelvic cul-de-sac. COMPARISON:  None. FINDINGS: Intrauterine gestational sac: Single Yolk sac:  Visualized. Embryo:  Not Visualized. Cardiac Activity: Heart Rate:  bpm MSD: 7.9  mm   5 w   3  d CRL:     mm    w  d                  [place] EDC: Subchorionic hemorrhage:  None visualized. Maternal uterus/adnexae: The right ovary is not visualized. The left ovary is normal. Trace fluid in the cul-de-sac is likely physiologic fluid. IMPRESSION: 1. Early IUP with visualization of a gestational sac and a yolk sac but no fetal pole. Recommend follow-up as clinically warranted. Electronically Signed   By: Gerome Sam III M.D   On: 08/28/2016 16:51     Assessment and Plan   1. Spotting affecting pregnancy in first trimester   2.  Bleeding in early pregnancy   G1 at [redacted]w[redacted]d with IUP, viability not determined  Will discharge home with bleeding precautions Allergies as of 08/28/2016   No Known Allergies     Medication List    STOP taking these medications   naproxen 500 MG tablet Commonly known as:  NAPROSYN     TAKE these medications   methocarbamol 500 MG tablet Commonly known as:  ROBAXIN Take 1 tablet (  500 mg total) by mouth 2 (two) times daily.   OB COMPLETE PETITE 35-5-1-200 MG Caps Take 1 tablet by mouth daily.   PROBIOTIC PO Take 1 capsule by mouth daily.   SYSTANE 0.4-0.3 % Soln Generic drug:  Polyethyl Glycol-Propyl Glycol Place 1 drop into both eyes daily.      Follow-up Information    Department, [facility]. Schedule an appointment as soon as possible for a visit in 1 week(s).   Contact information: [address] [place] [address] [phone]          Vickye Astorino CNM 08/28/2016, 4:09 PM

## 2016-08-28 NOTE — MAU Note (Signed)
Pt presents to MAU with complaints of pink vaginal bleeding that started earlier today with lowe abdominal cramping

## 2016-08-28 NOTE — MAU Note (Signed)
Urine in lab 

## 2016-08-30 LAB — GC/CHLAMYDIA PROBE AMP (~~LOC~~) NOT AT ARMC
CHLAMYDIA, DNA PROBE: NEGATIVE
NEISSERIA GONORRHEA: NEGATIVE

## 2016-09-02 ENCOUNTER — Ambulatory Visit (HOSPITAL_COMMUNITY)
Admission: RE | Admit: 2016-09-02 | Discharge: 2016-09-02 | Disposition: A | Discharge status: Home / Self Care | Payer: BLUE CROSS/BLUE SHIELD | Source: Ambulatory Visit | Attending: Obstetrics & Gynecology | Admitting: Obstetrics & Gynecology

## 2016-09-02 DIAGNOSIS — O3680X0 Pregnancy with inconclusive fetal viability, not applicable or unspecified: Secondary | ICD-10-CM

## 2016-09-02 DIAGNOSIS — Z3491 Encounter for supervision of normal pregnancy, unspecified, first trimester: Secondary | ICD-10-CM | POA: Diagnosis not present

## 2016-09-02 DIAGNOSIS — Z3490 Encounter for supervision of normal pregnancy, unspecified, unspecified trimester: Secondary | ICD-10-CM | POA: Diagnosis present

## 2016-09-16 ENCOUNTER — Encounter: Payer: Self-pay | Admitting: Family Medicine

## 2016-09-16 ENCOUNTER — Ambulatory Visit (INDEPENDENT_AMBULATORY_CARE_PROVIDER_SITE_OTHER): Payer: BLUE CROSS/BLUE SHIELD | Admitting: Family Medicine

## 2016-09-16 VITALS — BP 111/65 | HR 89 | Wt 145.0 lb

## 2016-09-16 DIAGNOSIS — Z3480 Encounter for supervision of other normal pregnancy, unspecified trimester: Secondary | ICD-10-CM | POA: Insufficient documentation

## 2016-09-16 DIAGNOSIS — Z3481 Encounter for supervision of other normal pregnancy, first trimester: Secondary | ICD-10-CM | POA: Diagnosis not present

## 2016-09-16 DIAGNOSIS — Z3689 Encounter for other specified antenatal screening: Secondary | ICD-10-CM

## 2016-09-16 DIAGNOSIS — Z124 Encounter for screening for malignant neoplasm of cervix: Secondary | ICD-10-CM | POA: Diagnosis not present

## 2016-09-16 DIAGNOSIS — Z98891 History of uterine scar from previous surgery: Secondary | ICD-10-CM

## 2016-09-16 NOTE — Progress Notes (Signed)
DATING AND VIABILITY SONOGRAM   Glenetta BorgRenee Starliper is a 31 y.o. year old [phone] with LMP Patient's last menstrual period was 07/21/2016 (exact date). which would correlate to  6727w1d weeks gestation.  She has regular menstrual cycles.   She is here today for a confirmatory initial sonogram.    GESTATION: SINGLETON     FETAL ACTIVITY:          Heart rate       158          The fetus is active.  PLACENTA LOCALIZATION:  anterior     GESTATIONAL AGE AND  BIOMETRICS:  Gestational criteria: Estimated Date of Delivery: 04/27/17 by LMP now at 3827w1d  Previous Scans:1      CROWN RUMP LENGTH           1.5 cm        7.5weeks                                                                               AVERAGE EGA(BY THIS SCAN): 7.5 weeks  WORKING EDD( LMP ):  04-27-16    Armandina StammerJennifer Makeila Yamaguchi 09/16/2016 8:53 AM

## 2016-09-16 NOTE — Progress Notes (Signed)
Subjective:  Brianna Palmer is a [id number] [redacted]w[redacted]d being seen today for her first obstetrical visit.  Her obstetrical history is significant for cesarean section. Patient does intend to breast feed. Pregnancy history fully reviewed.  Patient reports no complaints.  BP 111/65   Pulse 89   Wt 145 lb (65.8 kg)   LMP 07/21/2016 (Exact Date)   BMI 25.69 kg/m   HISTORY: OB History  Gravida Para Term Preterm AB Living  SAB TAB Ectopic Multiple Live Births  # Outcome Date GA Lbr Len/2nd Weight Sex Delivery Anes PTL Lv  6 Current           5 Term 03/30/12 [redacted]w[redacted]d  6 lb 3.8 oz (2.83 kg) M CS-LVertical EPI  LIV  4 SAB 2012          3 SAB 2012 [redacted]w[redacted]d         2 TAB 2007          1 Gravida               Past Medical History:  Diagnosis Date  . No pertinent past medical history     Past Surgical History:  Procedure Laterality Date  . BUNIONECTOMY    . CESAREAN SECTION N/A 03/30/2012   Procedure: CESAREAN SECTION;  Surgeon: Antionette Char, MD;  Location: [facility];  Service: Obstetrics;  Laterality: N/A;  Primary Cesarean Section Delivery Baby Boy @ 0157, Apgars 8/9  . NO PAST SURGERIES      Family History  Problem Relation Age of Onset  . Adopted: Yes  . Other Neg Hx   . Cancer Neg Hx   . Hypertension Neg Hx   . Stroke Neg Hx      Exam    Uterus:     Pelvic Exam:    Perineum: No Hemorrhoids, Normal Perineum   Vulva: normal, Bartholin's, Urethra, Skene's normal   Vagina:  normal mucosa   Cervix: no cervical motion tenderness and no lesions   Adnexa: normal adnexa and no mass, fullness, tenderness   Bony Pelvis: gynecoid  System: Breast:  normal appearance, no masses or tenderness   Skin: normal coloration and turgor, no rashes    Neurologic: gait normal; reflexes normal and symmetric   Extremities: normal strength, tone, and muscle mass, no deformities, no erythema, induration, or nodules   HEENT PERRLA and extra ocular movement intact   Mouth/Teeth mucous membranes moist, pharynx normal without lesions   Neck supple and no masses   Cardiovascular: regular rate and rhythm, no murmurs or gallops   Respiratory:  appears well, vitals normal, no respiratory distress, acyanotic, normal RR, ear and throat exam is normal, neck free of mass or lymphadenopathy, chest clear, no wheezing, crepitations, rhonchi, normal symmetric air entry   Abdomen: soft, non-tender; bowel sounds normal; no masses,  no organomegaly   Urinary: urethral meatus normal      Assessment:    Pregnancy: [id number] Patient Active Problem List   Diagnosis Date Noted  . Supervision of other normal pregnancy, antepartum 09/16/2016  . Depressive disorder, not elsewhere classified 06/08/2012      Plan:   1. Supervision of other normal pregnancy, antepartum - CULTURE, URINE COMPREHENSIVE - Obstetric Panel, Including HIV - Sickle Cell Scr  2. History of cesarean section Desires VBAC   Problem list reviewed and updated. 75% of 30 min visit spent on counseling and coordination of care.  Levie HeritageJacob J Stinson 09/16/2016

## 2016-09-16 NOTE — Patient Instructions (Signed)
For Nausea: Doxylamine (unisom)  at bedtime Vitamin B6  4 times a day

## 2016-09-18 LAB — OBSTETRIC PANEL, INCLUDING HIV
ANTIBODY SCREEN: NEGATIVE
BASOS: 0 %
Basophils Absolute: 0 10*3/uL (ref 0.0–0.2)
EOS (ABSOLUTE): 0.1 10*3/uL (ref 0.0–0.4)
EOS: 1 %
HEMATOCRIT: 37.4 % (ref 34.0–46.6)
HEMOGLOBIN: 12.6 g/dL (ref 11.1–15.9)
HEP B S AG: NEGATIVE
HIV Screen 4th Generation wRfx: NONREACTIVE
IMMATURE GRANS (ABS): 0 10*3/uL (ref 0.0–0.1)
IMMATURE GRANULOCYTES: 0 %
LYMPHS: 36 %
Lymphocytes Absolute: 2.9 10*3/uL (ref 0.7–3.1)
MCH: 29.3 pg (ref 26.6–33.0)
MCHC: 33.7 g/dL (ref 31.5–35.7)
MCV: 87 fL (ref 79–97)
MONOCYTES: 8 %
Monocytes Absolute: 0.7 10*3/uL (ref 0.1–0.9)
NEUTROS PCT: 55 %
Neutrophils Absolute: 4.3 10*3/uL (ref 1.4–7.0)
Platelets: 295 10*3/uL (ref 150–379)
RBC: 4.3 x10E6/uL (ref 3.77–5.28)
RDW: 14.3 % (ref 12.3–15.4)
RH TYPE: POSITIVE
RPR Ser Ql: NONREACTIVE
RUBELLA: 4.08 {index} (ref >0.99)
WBC: 8 10*3/uL (ref 3.4–10.8)

## 2016-09-18 LAB — SICKLE CELL SCREEN: SICKLE CELL SCREEN: NEGATIVE

## 2016-09-20 LAB — CULTURE, URINE COMPREHENSIVE

## 2016-09-23 ENCOUNTER — Telehealth: Payer: Self-pay

## 2016-09-23 NOTE — Telephone Encounter (Signed)
Error

## 2016-09-24 ENCOUNTER — Telehealth: Payer: Self-pay

## 2016-09-24 MED ORDER — METRONIDAZOLE 0.75 % VA GEL: 1.0000 | Freq: Two times a day (BID) | VAGINAL | 0 refills | Status: AC

## 2016-09-24 NOTE — Telephone Encounter (Signed)
Patient called with bacterial vaginosis symptoms. Looks like patient had wet prep in august but states she was treated with pills and would like the cream/gel. Patient complaining of foul odor discharge that is clear for two days. Rx sent. Armandina Stammer RNBSN

## 2016-09-28 ENCOUNTER — Telehealth: Payer: Self-pay

## 2016-09-28 ENCOUNTER — Ambulatory Visit (INDEPENDENT_AMBULATORY_CARE_PROVIDER_SITE_OTHER): Payer: BLUE CROSS/BLUE SHIELD | Admitting: Obstetrics & Gynecology

## 2016-09-28 DIAGNOSIS — A6000 Herpesviral infection of urogenital system, unspecified: Secondary | ICD-10-CM | POA: Insufficient documentation

## 2016-09-28 DIAGNOSIS — Z3480 Encounter for supervision of other normal pregnancy, unspecified trimester: Secondary | ICD-10-CM

## 2016-09-28 DIAGNOSIS — Z3481 Encounter for supervision of other normal pregnancy, first trimester: Secondary | ICD-10-CM

## 2016-09-28 DIAGNOSIS — Z23 Encounter for immunization: Secondary | ICD-10-CM | POA: Diagnosis not present

## 2016-09-28 DIAGNOSIS — A6009 Herpesviral infection of other urogenital tract: Secondary | ICD-10-CM

## 2016-09-28 DIAGNOSIS — O34219 Maternal care for unspecified type scar from previous cesarean delivery: Secondary | ICD-10-CM

## 2016-09-28 HISTORY — DX: Herpesviral infection of urogenital system, unspecified: A60.00

## 2016-09-28 NOTE — Progress Notes (Signed)
Patient presents to office as walk in. Complaining of headache and dizziness. Patient also asking for note fore work for frequent breaks and FMLA to be completed. Armandina Stammer RN

## 2016-09-28 NOTE — Telephone Encounter (Signed)
error 

## 2016-09-28 NOTE — Progress Notes (Signed)
   PRENATAL VISIT NOTE  Subjective:  Brianna Palmer is a 31 y.o. [phone] at [redacted]w[redacted]d being seen today for ongoing prenatal care.  She is currently monitored for the following issues for this low-risk pregnancy and has Depressive disorder, not elsewhere classified; Supervision of other normal pregnancy, antepartum; Herpes genitalis in women; and Previous cesarean delivery affecting pregnancy on her problem list.  Patient reports She is here for a work in visit due to headaches not relieved with Tylenol. She is also having difficulties at work ([employer]) with getting enough breaks to go to the bathroom. .   Lockie Pares. Bleeding: None.   . Denies leaking of fluid.   The following portions of the patient's history were reviewed and updated as appropriate: allergies, current medications, past family history, past medical history, past social history, past surgical history and problem list. Problem list updated.  Objective:   Vitals:   09/28/16 0930  BP: 107/64  Pulse: 74  Weight: 148 lb (67.1 kg)    Fetal Status:           General:  Alert, oriented and cooperative. Patient is in no acute distress.  Skin: Skin is warm and dry. No rash noted.   Cardiovascular: Normal heart rate noted  Respiratory: Normal respiratory effort, no problems with respiration noted  Abdomen: Soft, gravid, appropriate for gestational age.  Pain/Pressure: Absent     Pelvic: Cervical exam deferred        Extremities: Normal range of motion.  Edema: None  Mental Status:  Normal mood and affect. Normal behavior. Normal judgment and thought content.   Assessment and Plan:  Pregnancy: [id number] at [redacted]w[redacted]d  1. Supervision of other normal pregnancy, antepartum  - Flu Vaccine QUAD 36+ mos IM (Fluarix, Quad PF)  2. Herpes genitalis in women - start valtrex at 36 weeks  3. Previous cesarean delivery affecting pregnancy - desires VBAC 4. Headaches- She will get an appt with Smitty Cords at [facility]. She is now aware that she can take  IBU until [redacted] weeks EGA. We have given her letters for her workplace regarding frequency of breaks.  Preterm labor symptoms and general obstetric precautions including but not limited to vaginal bleeding, contractions, leaking of fluid and fetal movement were reviewed in detail with the patient. Please refer to After Visit Summary for other counseling recommendations.  Return for keep appt.   Allie Bossier, MD

## 2016-10-04 ENCOUNTER — Inpatient Hospital Stay (HOSPITAL_COMMUNITY)
Admission: AD | Admit: 2016-10-04 | Discharge: 2016-10-04 | Disposition: A | Discharge status: Home / Self Care | Payer: BLUE CROSS/BLUE SHIELD | Source: Ambulatory Visit | Attending: Family Medicine | Admitting: Family Medicine

## 2016-10-04 ENCOUNTER — Encounter (HOSPITAL_COMMUNITY): Payer: Self-pay | Admitting: *Deleted

## 2016-10-04 DIAGNOSIS — Z87891 Personal history of nicotine dependence: Secondary | ICD-10-CM | POA: Diagnosis not present

## 2016-10-04 DIAGNOSIS — O26899 Other specified pregnancy related conditions, unspecified trimester: Secondary | ICD-10-CM

## 2016-10-04 DIAGNOSIS — R109 Unspecified abdominal pain: Secondary | ICD-10-CM | POA: Insufficient documentation

## 2016-10-04 DIAGNOSIS — O26891 Other specified pregnancy related conditions, first trimester: Secondary | ICD-10-CM | POA: Diagnosis not present

## 2016-10-04 DIAGNOSIS — Z3A10 10 weeks gestation of pregnancy: Secondary | ICD-10-CM | POA: Diagnosis not present

## 2016-10-04 DIAGNOSIS — Z3491 Encounter for supervision of normal pregnancy, unspecified, first trimester: Secondary | ICD-10-CM

## 2016-10-04 HISTORY — DX: Headache, unspecified: R51.9

## 2016-10-04 HISTORY — DX: Headache: R51

## 2016-10-04 LAB — URINALYSIS, DIPSTICK ONLY
Bilirubin Urine: NEGATIVE
GLUCOSE, UA: NEGATIVE mg/dL
HGB URINE DIPSTICK: NEGATIVE
Ketones, ur: NEGATIVE mg/dL
Leukocytes, UA: NEGATIVE
Nitrite: NEGATIVE
PH: 6 (ref 5.0–8.0)
Protein, ur: NEGATIVE mg/dL
Specific Gravity, Urine: 1.019 (ref 1.005–1.030)

## 2016-10-04 LAB — URINALYSIS, ROUTINE W REFLEX MICROSCOPIC
BILIRUBIN URINE: NEGATIVE
Glucose, UA: NEGATIVE mg/dL
HGB URINE DIPSTICK: NEGATIVE
Ketones, ur: NEGATIVE mg/dL
Leukocytes, UA: NEGATIVE
Nitrite: NEGATIVE
Protein, ur: NEGATIVE mg/dL
SPECIFIC GRAVITY, URINE: 1.02 (ref 1.005–1.030)
pH: 6 (ref 5.0–8.0)

## 2016-10-04 LAB — WET PREP, GENITAL
CLUE CELLS WET PREP: NONE SEEN
Sperm: NONE SEEN
TRICH WET PREP: NONE SEEN
WBC WET PREP: NONE SEEN
Yeast Wet Prep HPF POC: NONE SEEN

## 2016-10-04 NOTE — MAU Note (Signed)
Pt reports she stared feeling cramping this morning. Stated she does not feel like she is pregnant anymore. No more vomiting. Drenies any vag bleeding or discharge.

## 2016-10-04 NOTE — MAU Note (Signed)
Urine in lab 

## 2016-10-04 NOTE — Discharge Instructions (Signed)
Safe Medications in Pregnancy  ° °Acne: °Benzoyl Peroxide °Salicylic Acid ° °Backache/Headache: °Tylenol: 2 regular strength every 4 hours OR °             2 Extra strength every 6 hours ° °Colds/Coughs/Allergies: °Benadryl (alcohol free) 25 mg every 6 hours as needed °Breath right strips °Claritin °Cepacol throat lozenges °Chloraseptic throat spray °Cold-Eeze- up to three times per day °Cough drops, alcohol free °Flonase (by prescription only) °Guaifenesin °Mucinex °Robitussin DM (plain only, alcohol free) °Saline nasal spray/drops °Sudafed (pseudoephedrine) & Actifed ** use only after [redacted] weeks gestation and if you do not have high blood pressure °Tylenol °Vicks Vaporub °Zinc lozenges °Zyrtec  ° °Constipation: °Colace °Ducolax suppositories °Fleet enema °Glycerin suppositories °Metamucil °Milk of magnesia °Miralax °Senokot °Smooth move tea ° °Diarrhea: °Kaopectate °Imodium A-D ° °*NO pepto Bismol ° °Hemorrhoids: °Anusol °Anusol HC °Preparation H °Tucks ° °Indigestion: °Tums °Maalox °Mylanta °Zantac  °Pepcid ° °Insomnia: °Benadryl (alcohol free) 25mg every 6 hours as needed °Tylenol PM °Unisom, no Gelcaps ° °Leg Cramps: °Tums °MagGel ° °Nausea/Vomiting:  °Bonine °Dramamine °Emetrol °Ginger extract °Sea bands °Meclizine  °Nausea medication to take during pregnancy:  °Unisom (doxylamine succinate 25 mg tablets) Take one tablet daily at bedtime. If symptoms are not adequately controlled, the dose can be increased to a maximum recommended dose of two tablets daily (1/2 tablet in the morning, 1/2 tablet mid-afternoon and one at bedtime). °Vitamin B6 100mg tablets. Take one tablet twice a day (up to 200 mg per day). ° °Skin Rashes: °Aveeno products °Benadryl cream or 25mg every 6 hours as needed °Calamine Lotion °1% cortisone cream ° °Yeast infection: °Gyne-lotrimin 7 °Monistat 7 ° °Gum/tooth pain: °Anbesol ° °**If taking multiple medications, please check labels to avoid duplicating the same active ingredients °**take  medication as directed on the label °** Do not exceed 4000 mg of tylenol in 24 hours °**Do not take medications that contain aspirin or ibuprofen ° ° ° ° °Constipation, Adult °Constipation is when a person has fewer bowel movements in a week than normal, has difficulty having a bowel movement, or has stools that are dry, hard, or larger than normal. Constipation may be caused by an underlying condition. It may become worse with age if a person takes certain medicines and does not take in enough fluids. °Follow these instructions at home: °Eating and drinking ° °· Eat foods that have a lot of fiber, such as fresh fruits and vegetables, whole grains, and beans. °· Limit foods that are high in fat, low in fiber, or overly processed, such as french fries, hamburgers, cookies, candies, and soda. °· Drink enough fluid to keep your urine clear or pale yellow. °General instructions °· Exercise regularly or as told by your health care provider. °· Go to the restroom when you have the urge to go. Do not hold it in. °· Take over-the-counter and prescription medicines only as told by your health care provider. These include any fiber supplements. °· Practice pelvic floor retraining exercises, such as deep breathing while relaxing the lower abdomen and pelvic floor relaxation during bowel movements. °· Watch your condition for any changes. °· Keep all follow-up visits as told by your health care provider. This is important. °Contact a health care provider if: °· You have pain that gets worse. °· You have a fever. °· You do not have a bowel movement after 4 days. °· You vomit. °· You are not hungry. °· You lose weight. °· You are bleeding from the anus. °·   You have thin, pencil-like stools. Get help right away if:  You have a fever and your symptoms suddenly get worse.  You leak stool or have blood in your stool.  Your abdomen is bloated.  You have severe pain in your abdomen.  You feel dizzy or you faint. This  information is not intended to replace advice given to you by your health care provider. Make sure you discuss any questions you have with your health care provider. Document Released: 09/26/2003 Document Revised: 07/18/2015 Document Reviewed: 06/18/2015 Elsevier Interactive Patient Education  2017 [employer].

## 2016-10-04 NOTE — MAU Note (Signed)
Pt presents with c/o "lacking symptoms of pregnancy" and feel pregnant anymore.  Pt states she no longer has breast tenderness, N&V, or frequent urination.  Pt also reports lower abdominal cramping, no VB.

## 2016-10-04 NOTE — MAU Provider Note (Signed)
History     CSN: [id number]  Arrival date and time: 10/04/16 1358  First Provider Initiated Contact with Patient 10/04/16 1656      Chief Complaint  Patient presents with  . Abdominal Pain   HPI Brianna Palmer is a 31 y.o. G5P1031 at [redacted]w[redacted]d who presents with abdominal cramping. Symptoms began this morning. Reports intermittent lower abdominal cramping. Rates pain 4/10. Has not treated. Denies n/v, dysuria, vaginal discharge, or vaginal bleeding. Concerned she is having a miscarriage b/c she's no longer having pregnancy symptoms (nausea, breast tenderness). Last BM was yesterday. States she's been struggling with constipation but hasn't treated it b/c she didn't know what she could take during pregnancy. Last Intercourse last night.   OB History    Gravida Para Term Preterm AB Living   SAB TAB Ectopic Multiple Live Births   Past Medical History:  Diagnosis Date  . Headache     Past Surgical History:  Procedure Laterality Date  . BUNIONECTOMY    . CESAREAN SECTION N/A 03/30/2012   Procedure: CESAREAN SECTION;  Surgeon: Antionette Char, MD;  Location: [facility];  Service: Obstetrics;  Laterality: N/A;  Primary Cesarean Section Delivery Baby Boy @ 0157, Apgars 8/9    Family History  Problem Relation Age of Onset  . Adopted: Yes  . Other Neg Hx   . Cancer Neg Hx   . Hypertension Neg Hx   . Stroke Neg Hx     Social History  Substance Use Topics  . Smoking status: Former Smoker    Packs/day: 0.25    Types: Cigarettes    Quit date: 07/01/2013  . Smokeless tobacco: Never Used  . Alcohol use No     Comment: socially    Allergies: No Known Allergies  Prescriptions Prior to Admission  Medication Sig Dispense Refill Last Dose  . acetaminophen (TYLENOL) 325 MG tablet Take 650 mg by mouth every 6 (six) hours as needed.   Taking  . oxymetazoline (AFRIN) 0.05 % nasal spray Place 1 spray into both nostrils 2 (two) times daily.   Taking  . Polyethyl  Glycol-Propyl Glycol (SYSTANE) 0.4-0.3 % SOLN Place 1 drop into both eyes daily.   Taking  . Prenat-FeCbn-FeAspGl-FA-Omega (OB COMPLETE PETITE) 35-5-1-200 MG CAPS Take 1 tablet by mouth daily. 30 capsule 12 Taking  . Probiotic Product (PROBIOTIC PO) Take 1 capsule by mouth daily.   Past Week at Unknown time    Review of Systems  Constitutional: Negative.   Gastrointestinal: Positive for abdominal pain and constipation. Negative for diarrhea, nausea and vomiting.  Genitourinary: Negative.    Physical Exam   Blood pressure 108/70, pulse 76, temperature 97.8 F (36.6 C), temperature source Oral, resp. rate 19, last menstrual period 07/21/2016, SpO2 98 %, currently breastfeeding.  Physical Exam  Nursing note and vitals reviewed. Constitutional: She is oriented to person, place, and time. She appears well-developed and well-nourished. No distress.  HENT:  Head: Normocephalic and atraumatic.  Eyes: Conjunctivae are normal. Right eye exhibits no discharge. Left eye exhibits no discharge. No scleral icterus.  Neck: Normal range of motion.  Respiratory: Effort normal. No respiratory distress.  GI: Soft. Bowel sounds are normal. She exhibits no distension. There is no tenderness.  Neurological: She is alert and oriented to person, place, and time.  Skin: Skin is warm and dry. She is not diaphoretic.  Psychiatric: She has a normal  mood and affect. Her behavior is normal. Judgment and thought content normal.    MAU Course  Procedures Results for orders placed or performed during the hospital encounter of 10/04/16 (from the past 24 hour(s))  Urinalysis, dipstick only     Status: None   Collection Time: 10/04/16  2:10 PM  Result Value Ref Range   Color, Urine YELLOW YELLOW   APPearance CLEAR CLEAR   Specific Gravity, Urine 1.019 1.005 - 1.030   pH 6.0 5.0 - 8.0   Glucose, UA NEGATIVE NEGATIVE mg/dL   Hgb urine dipstick NEGATIVE NEGATIVE   Bilirubin Urine NEGATIVE NEGATIVE   Ketones, ur  NEGATIVE NEGATIVE mg/dL   Protein, ur NEGATIVE NEGATIVE mg/dL   Nitrite NEGATIVE NEGATIVE   Leukocytes, UA NEGATIVE NEGATIVE  Urinalysis, Routine w reflex microscopic     Status: None   Collection Time: 10/04/16  2:10 PM  Result Value Ref Range   Color, Urine YELLOW YELLOW   APPearance CLEAR CLEAR   Specific Gravity, Urine 1.020 1.005 - 1.030   pH 6.0 5.0 - 8.0   Glucose, UA NEGATIVE NEGATIVE mg/dL   Hgb urine dipstick NEGATIVE NEGATIVE   Bilirubin Urine NEGATIVE NEGATIVE   Ketones, ur NEGATIVE NEGATIVE mg/dL   Protein, ur NEGATIVE NEGATIVE mg/dL   Nitrite NEGATIVE NEGATIVE   Leukocytes, UA NEGATIVE NEGATIVE  Wet prep, genital     Status: None   Collection Time: 10/04/16  4:11 PM  Result Value Ref Range   Yeast Wet Prep HPF POC NONE SEEN NONE SEEN   Trich, Wet Prep NONE SEEN NONE SEEN   Clue Cells Wet Prep HPF POC NONE SEEN NONE SEEN   WBC, Wet Prep HPF POC NONE SEEN NONE SEEN   Sperm NONE SEEN     MDM FHT 168. Cervix closed/thick U/a negative. GC/CT & wet prep collected Pain resolved without intervention Assessment and Plan  A: 1. Abdominal pain affecting pregnancy   2. [redacted] weeks gestation of pregnancy   3. Fetal heart tones present, first trimester    P: Discharge home Discussed treatment of constipation & given list of OTC meds safe in pregnancy GC/CT pending Discussed reasons to return to MAU Keep f/u with OB  Judeth Horn 10/04/2016, 4:55 PM

## 2016-10-05 LAB — GC/CHLAMYDIA PROBE AMP (~~LOC~~) NOT AT ARMC
Chlamydia: NEGATIVE
Neisseria Gonorrhea: NEGATIVE

## 2016-10-13 ENCOUNTER — Encounter: Payer: Self-pay | Admitting: Family Medicine

## 2016-10-13 ENCOUNTER — Ambulatory Visit (INDEPENDENT_AMBULATORY_CARE_PROVIDER_SITE_OTHER): Payer: Medicaid Other | Admitting: Family Medicine

## 2016-10-13 VITALS — BP 115/70 | HR 79 | Wt 152.0 lb

## 2016-10-13 DIAGNOSIS — Z3481 Encounter for supervision of other normal pregnancy, first trimester: Secondary | ICD-10-CM

## 2016-10-13 DIAGNOSIS — O34219 Maternal care for unspecified type scar from previous cesarean delivery: Secondary | ICD-10-CM

## 2016-10-13 DIAGNOSIS — Z3480 Encounter for supervision of other normal pregnancy, unspecified trimester: Secondary | ICD-10-CM

## 2016-10-13 DIAGNOSIS — Z3490 Encounter for supervision of normal pregnancy, unspecified, unspecified trimester: Secondary | ICD-10-CM

## 2016-10-13 NOTE — Progress Notes (Signed)
   PRENATAL VISIT NOTE  Subjective:  Brianna Palmer is a 31 y.o. G5P1031 at [redacted]w[redacted]d being seen today for ongoing prenatal care.  She is currently monitored for the following issues for this low-risk pregnancy and has Depressive disorder, not elsewhere classified; Supervision of other normal pregnancy, antepartum; Herpes genitalis in women; and Previous cesarean delivery affecting pregnancy on her problem list.  Patient reports backache and left hip pain without sciatica.   . Vag. Bleeding: None.   . Denies leaking of fluid.   The following portions of the patient's history were reviewed and updated as appropriate: allergies, current medications, past family history, past medical history, past social history, past surgical history and problem list. Problem list updated.  Objective:   Vitals:   10/13/16 1442  BP: 115/70  Pulse: 79  Weight: 152 lb (68.9 kg)    Fetal Status: Fetal Heart Rate (bpm): 165         General:  Alert, oriented and cooperative. Patient is in no acute distress.  Skin: Skin is warm and dry. No rash noted.   Cardiovascular: Normal heart rate noted  Respiratory: Normal respiratory effort, no problems with respiration noted  Abdomen: Soft, gravid, appropriate for gestational age.  Pain/Pressure: Absent     Pelvic: Cervical exam deferred        Extremities: Normal range of motion.  Edema: None  Mental Status:  Normal mood and affect. Normal behavior. Normal judgment and thought content.   Assessment and Plan:  Pregnancy: G5P1031 at [redacted]w[redacted]d  1. Prenatal care, antepartum Anatomy and first screen ordered - US MFM OB DETAIL +14 WK; Future - [place] MFM Fetal Nuchal Translucency; Future Consider changing chairs for comfort--h/o scoliosis  2. Previous cesarean delivery affecting pregnancy   General obstetric precautions including but not limited to vaginal bleeding, contractions, leaking of fluid and fetal movement were reviewed in detail with the patient. Please refer to  After Visit Summary for other counseling recommendations.  Return in 4 weeks (on 11/10/2016).   [name], MD

## 2016-10-13 NOTE — Patient Instructions (Signed)
Breastfeeding Deciding to breastfeed is one of the best choices you can make for you and your baby. A change in hormones during pregnancy causes your breast tissue to grow and increases the number and size of your milk ducts. These hormones also allow proteins, sugars, and fats from your blood supply to make breast milk in your milk-producing glands. Hormones prevent breast milk from being released before your baby is born as well as prompt milk flow after birth. Once breastfeeding has begun, thoughts of your baby, as well as his or her sucking or crying, can stimulate the release of milk from your milk-producing glands. Benefits of breastfeeding For Your Baby  Your first milk (colostrum) helps your baby's digestive system function better.  There are antibodies in your milk that help your baby fight off infections.  Your baby has a lower incidence of asthma, allergies, and sudden infant death syndrome.  The nutrients in breast milk are better for your baby than infant formulas and are designed uniquely for your baby's needs.  Breast milk improves your baby's brain development.  Your baby is less likely to develop other conditions, such as childhood obesity, asthma, or type 2 diabetes mellitus.  For You  Breastfeeding helps to create a very special bond between you and your baby.  Breastfeeding is convenient. Breast milk is always available at the correct temperature and costs nothing.  Breastfeeding helps to burn calories and helps you lose the weight gained during pregnancy.  Breastfeeding makes your uterus contract to its prepregnancy size faster and slows bleeding (lochia) after you give birth.  Breastfeeding helps to lower your risk of developing type 2 diabetes mellitus, osteoporosis, and breast or ovarian cancer later in life.  Signs that your baby is hungry Early Signs of Hunger  Increased alertness or activity.  Stretching.  Movement of the head from side to  side.  Movement of the head and opening of the mouth when the corner of the mouth or cheek is stroked (rooting).  Increased sucking sounds, smacking lips, cooing, sighing, or squeaking.  Hand-to-mouth movements.  Increased sucking of fingers or hands.  Late Signs of Hunger  Fussing.  Intermittent crying.  Extreme Signs of Hunger Signs of extreme hunger will require calming and consoling before your baby will be able to breastfeed successfully. Do not wait for the following signs of extreme hunger to occur before you initiate breastfeeding:  Restlessness.  A loud, strong cry.  Screaming.  Breastfeeding basics Breastfeeding Initiation  Find a comfortable place to sit or lie down, with your neck and back well supported.  Place a pillow or rolled up blanket under your baby to bring him or her to the level of your breast (if you are seated). Nursing pillows are specially designed to help support your arms and your baby while you breastfeed.  Make sure that your baby's abdomen is facing your abdomen.  Gently massage your breast. With your fingertips, massage from your chest wall toward your nipple in a circular motion. This encourages milk flow. You may need to continue this action during the feeding if your milk flows slowly.  Support your breast with 4 fingers underneath and your thumb above your nipple. Make sure your fingers are well away from your nipple and your baby's mouth.  Stroke your baby's lips gently with your finger or nipple.  When your baby's mouth is open wide enough, quickly bring your baby to your breast, placing your entire nipple and as much of the colored area   around your nipple (areola) as possible into your baby's mouth. ? More areola should be visible above your baby's upper lip than below the lower lip. ? Your baby's tongue should be between his or her lower gum and your breast.  Ensure that your baby's mouth is correctly positioned around your nipple  (latched). Your baby's lips should create a seal on your breast and be turned out (everted).  It is common for your baby to suck about 2-3 minutes in order to start the flow of breast milk.  Latching Teaching your baby how to latch on to your breast properly is very important. An improper latch can cause nipple pain and decreased milk supply for you and poor weight gain in your baby. Also, if your baby is not latched onto your nipple properly, he or she may swallow some air during feeding. This can make your baby fussy. Burping your baby when you switch breasts during the feeding can help to get rid of the air. However, teaching your baby to latch on properly is still the best way to prevent fussiness from swallowing air while breastfeeding. Signs that your baby has successfully latched on to your nipple:  Silent tugging or silent sucking, without causing you pain.  Swallowing heard between every 3-4 sucks.  Muscle movement above and in front of his or her ears while sucking.  Signs that your baby has not successfully latched on to nipple:  Sucking sounds or smacking sounds from your baby while breastfeeding.  Nipple pain.  If you think your baby has not latched on correctly, slip your finger into the corner of your baby's mouth to break the suction and place it between your baby's gums. Attempt breastfeeding initiation again. Signs of Successful Breastfeeding Signs from your baby:  A gradual decrease in the number of sucks or complete cessation of sucking.  Falling asleep.  Relaxation of his or her body.  Retention of a small amount of milk in his or her mouth.  Letting go of your breast by himself or herself.  Signs from you:  Breasts that have increased in firmness, weight, and size 1-3 hours after feeding.  Breasts that are softer immediately after breastfeeding.  Increased milk volume, as well as a change in milk consistency and color by the fifth day of  breastfeeding.  Nipples that are not sore, cracked, or bleeding.  Signs That Your Baby is Getting Enough Milk  Wetting at least 1-2 diapers during the first 24 hours after birth.  Wetting at least 5-6 diapers every 24 hours for the first week after birth. The urine should be clear or pale yellow by 5 days after birth.  Wetting 6-8 diapers every 24 hours as your baby continues to grow and develop.  At least 3 stools in a 24-hour period by age 5 days. The stool should be soft and yellow.  At least 3 stools in a 24-hour period by age 7 days. The stool should be seedy and yellow.  No loss of weight greater than 10% of birth weight during the first 3 days of age.  Average weight gain of 4-7 ounces (113-198 g) per week after age 4 days.  Consistent daily weight gain by age 5 days, without weight loss after the age of 2 weeks.  After a feeding, your baby may spit up a small amount. This is common. Breastfeeding frequency and duration Frequent feeding will help you make more milk and can prevent sore nipples and breast engorgement. Breastfeed when   you feel the need to reduce the fullness of your breasts or when your baby shows signs of hunger. This is called "breastfeeding on demand." Avoid introducing a pacifier to your baby while you are working to establish breastfeeding (the first 4-6 weeks after your baby is born). After this time you may choose to use a pacifier. Research has shown that pacifier use during the first year of a baby's life decreases the risk of sudden infant death syndrome (SIDS). Allow your baby to feed on each breast as long as he or she wants. Breastfeed until your baby is finished feeding. When your baby unlatches or falls asleep while feeding from the first breast, offer the second breast. Because newborns are often sleepy in the first few weeks of life, you may need to awaken your baby to get him or her to feed. Breastfeeding times will vary from baby to baby. However,  the following rules can serve as a guide to help you ensure that your baby is properly fed:  Newborns (babies 4 weeks of age or younger) may breastfeed every 1-3 hours.  Newborns should not go longer than 3 hours during the day or 5 hours during the night without breastfeeding.  You should breastfeed your baby a minimum of 8 times in a 24-hour period until you begin to introduce solid foods to your baby at around 6 months of age.  Breast milk pumping Pumping and storing breast milk allows you to ensure that your baby is exclusively fed your breast milk, even at times when you are unable to breastfeed. This is especially important if you are going back to work while you are still breastfeeding or when you are not able to be present during feedings. Your lactation consultant can give you guidelines on how long it is safe to store breast milk. A breast pump is a machine that allows you to pump milk from your breast into a sterile bottle. The pumped breast milk can then be stored in a refrigerator or freezer. Some breast pumps are operated by hand, while others use electricity. Ask your lactation consultant which type will work best for you. Breast pumps can be purchased, but some hospitals and breastfeeding support groups lease breast pumps on a monthly basis. A lactation consultant can teach you how to hand express breast milk, if you prefer not to use a pump. Caring for your breasts while you breastfeed Nipples can become dry, cracked, and sore while breastfeeding. The following recommendations can help keep your breasts moisturized and healthy:  Avoid using soap on your nipples.  Wear a supportive bra. Although not required, special nursing bras and tank tops are designed to allow access to your breasts for breastfeeding without taking off your entire bra or top. Avoid wearing underwire-style bras or extremely tight bras.  Air dry your nipples for 3-4minutes after each feeding.  Use only cotton  bra pads to absorb leaked breast milk. Leaking of breast milk between feedings is normal.  Use lanolin on your nipples after breastfeeding. Lanolin helps to maintain your skin's normal moisture barrier. If you use pure lanolin, you do not need to wash it off before feeding your baby again. Pure lanolin is not toxic to your baby. You may also hand express a few drops of breast milk and gently massage that milk into your nipples and allow the milk to air dry.  In the first few weeks after giving birth, some women experience extremely full breasts (engorgement). Engorgement can make your   breasts feel heavy, warm, and tender to the touch. Engorgement peaks within 3-5 days after you give birth. The following recommendations can help ease engorgement:  Completely empty your breasts while breastfeeding or pumping. You may want to start by applying warm, moist heat (in the shower or with warm water-soaked hand towels) just before feeding or pumping. This increases circulation and helps the milk flow. If your baby does not completely empty your breasts while breastfeeding, pump any extra milk after he or she is finished.  Wear a snug bra (nursing or regular) or tank top for 1-2 days to signal your body to slightly decrease milk production.  Apply ice packs to your breasts, unless this is too uncomfortable for you.  Make sure that your baby is latched on and positioned properly while breastfeeding.  If engorgement persists after 48 hours of following these recommendations, contact your health care provider or a lactation consultant. Overall health care recommendations while breastfeeding  Eat healthy foods. Alternate between meals and snacks, eating 3 of each per day. Because what you eat affects your breast milk, some of the foods may make your baby more irritable than usual. Avoid eating these foods if you are sure that they are negatively affecting your baby.  Drink milk, fruit juice, and water to  satisfy your thirst (about 10 glasses a day).  Rest often, relax, and continue to take your prenatal vitamins to prevent fatigue, stress, and anemia.  Continue breast self-awareness checks.  Avoid chewing and smoking tobacco. Chemicals from cigarettes that pass into breast milk and exposure to secondhand smoke may harm your baby.  Avoid alcohol and drug use, including marijuana. Some medicines that may be harmful to your baby can pass through breast milk. It is important to ask your health care provider before taking any medicine, including all over-the-counter and prescription medicine as well as vitamin and herbal supplements. It is possible to become pregnant while breastfeeding. If birth control is desired, ask your health care provider about options that will be safe for your baby. Contact a health care provider if:  You feel like you want to stop breastfeeding or have become frustrated with breastfeeding.  You have painful breasts or nipples.  Your nipples are cracked or bleeding.  Your breasts are red, tender, or warm.  You have a swollen area on either breast.  You have a fever or chills.  You have nausea or vomiting.  You have drainage other than breast milk from your nipples.  Your breasts do not become full before feedings by the fifth day after you give birth.  You feel sad and depressed.  Your baby is too sleepy to eat well.  Your baby is having trouble sleeping.  Your baby is wetting less than 3 diapers in a 24-hour period.  Your baby has less than 3 stools in a 24-hour period.  Your baby's skin or the white part of his or her eyes becomes yellow.  Your baby is not gaining weight by 5 days of age. Get help right away if:  Your baby is overly tired (lethargic) and does not want to wake up and feed.  Your baby develops an unexplained fever. This information is not intended to replace advice given to you by your health care provider. Make sure you discuss  any questions you have with your health care provider. Document Released: 12/28/2004 Document Revised: 06/11/2015 Document Reviewed: 06/21/2012 Elsevier Interactive Patient Education  2017 [employer]  

## 2016-10-14 ENCOUNTER — Encounter: Payer: BLUE CROSS/BLUE SHIELD | Admitting: Family Medicine

## 2016-10-20 ENCOUNTER — Encounter: Payer: BLUE CROSS/BLUE SHIELD | Admitting: Family Medicine

## 2016-10-21 ENCOUNTER — Other Ambulatory Visit: Payer: Self-pay | Admitting: Family Medicine

## 2016-10-21 ENCOUNTER — Encounter (HOSPITAL_COMMUNITY): Payer: Self-pay

## 2016-10-21 ENCOUNTER — Ambulatory Visit (HOSPITAL_COMMUNITY)
Admission: RE | Admit: 2016-10-21 | Discharge: 2016-10-21 | Disposition: A | Discharge status: Home / Self Care | Payer: BLUE CROSS/BLUE SHIELD | Source: Ambulatory Visit | Attending: Family Medicine | Admitting: Family Medicine

## 2016-10-21 DIAGNOSIS — Z3A13 13 weeks gestation of pregnancy: Secondary | ICD-10-CM

## 2016-10-21 DIAGNOSIS — O34219 Maternal care for unspecified type scar from previous cesarean delivery: Secondary | ICD-10-CM | POA: Insufficient documentation

## 2016-10-21 DIAGNOSIS — Z3682 Encounter for antenatal screening for nuchal translucency: Secondary | ICD-10-CM | POA: Insufficient documentation

## 2016-10-21 DIAGNOSIS — Z3490 Encounter for supervision of normal pregnancy, unspecified, unspecified trimester: Secondary | ICD-10-CM

## 2016-10-27 ENCOUNTER — Other Ambulatory Visit (INDEPENDENT_AMBULATORY_CARE_PROVIDER_SITE_OTHER): Payer: BLUE CROSS/BLUE SHIELD

## 2016-10-27 DIAGNOSIS — R309 Painful micturition, unspecified: Secondary | ICD-10-CM

## 2016-10-27 NOTE — Progress Notes (Addendum)
Pt came in today for a urine culture. When she spoke to the nurse on the phone she stated she had painful urination so the nurse told her to come in and leave a urine to send for culture. Urine was obtained and sent off for a urine culture.

## 2016-10-29 ENCOUNTER — Other Ambulatory Visit (HOSPITAL_COMMUNITY): Payer: Self-pay

## 2016-10-29 LAB — URINE CULTURE: ORGANISM ID, BACTERIA: NO GROWTH

## 2016-11-05 ENCOUNTER — Encounter: Payer: Self-pay | Admitting: *Deleted

## 2016-11-05 ENCOUNTER — Ambulatory Visit (INDEPENDENT_AMBULATORY_CARE_PROVIDER_SITE_OTHER): Payer: BLUE CROSS/BLUE SHIELD | Admitting: Physician Assistant

## 2016-11-05 ENCOUNTER — Encounter: Payer: Self-pay | Admitting: Physician Assistant

## 2016-11-05 VITALS — BP 106/69 | HR 82 | Wt 151.6 lb

## 2016-11-05 DIAGNOSIS — Z331 Pregnant state, incidental: Secondary | ICD-10-CM | POA: Diagnosis not present

## 2016-11-05 DIAGNOSIS — G43009 Migraine without aura, not intractable, without status migrainosus: Secondary | ICD-10-CM

## 2016-11-05 DIAGNOSIS — G43001 Migraine without aura, not intractable, with status migrainosus: Secondary | ICD-10-CM

## 2016-11-05 DIAGNOSIS — M62838 Other muscle spasm: Secondary | ICD-10-CM | POA: Insufficient documentation

## 2016-11-05 HISTORY — DX: Migraine without aura, not intractable, with status migrainosus: G43.001

## 2016-11-05 MED ORDER — CYCLOBENZAPRINE HCL 10 MG PO TABS: 10.0000 mg | ORAL_TABLET | Freq: Three times a day (TID) | ORAL | 2 refills | Status: DC | PRN

## 2016-11-05 MED ORDER — METOCLOPRAMIDE HCL 10 MG PO TABS: 10.0000 mg | ORAL_TABLET | Freq: Three times a day (TID) | ORAL | 2 refills | Status: DC | PRN

## 2016-11-05 NOTE — Patient Instructions (Signed)
Migraine Headache A migraine headache is an intense, throbbing pain on one side or both sides of the head. Migraines may also cause other symptoms, such as nausea, vomiting, and sensitivity to light and noise. What are the causes? Doing or taking certain things may also trigger migraines, such as:  Alcohol.  Smoking.  Medicines, such as: ? Medicine used to treat chest pain (nitroglycerine). ? Birth control pills. ? Estrogen pills. ? Certain blood pressure medicines.  Aged cheeses, chocolate, or caffeine.  Foods or drinks that contain nitrates, glutamate, aspartame, or tyramine.  Physical activity.  Other things that may trigger a migraine include:  Menstruation.  Pregnancy.  Hunger.  Stress, lack of sleep, too much sleep, or fatigue.  Weather changes.  What increases the risk? The following factors may make you more likely to experience migraine headaches:  Age. Risk increases with age.  Family history of migraine headaches.  Being Caucasian.  Depression and anxiety.  Obesity.  Being a woman.  Having a hole in the heart (patent foramen ovale) or other heart problems.  What are the signs or symptoms? The main symptom of this condition is pulsating or throbbing pain. Pain may:  Happen in any area of the head, such as on one side or both sides.  Interfere with daily activities.  Get worse with physical activity.  Get worse with exposure to bright lights or loud noises.  Other symptoms may include:  Nausea.  Vomiting.  Dizziness.  General sensitivity to bright lights, loud noises, or smells.  Before you get a migraine, you may get warning signs that a migraine is developing (aura). An aura may include:  Seeing flashing lights or having blind spots.  Seeing bright spots, halos, or zigzag lines.  Having tunnel vision or blurred vision.  Having numbness or a tingling feeling.  Having trouble talking.  Having muscle weakness.  How is this  diagnosed? A migraine headache can be diagnosed based on:  Your symptoms.  A physical exam.  Tests, such as CT scan or MRI of the head. These imaging tests can help rule out other causes of headaches.  Taking fluid from the spine (lumbar puncture) and analyzing it (cerebrospinal fluid analysis, or CSF analysis).  How is this treated? A migraine headache is usually treated with medicines that:  Relieve pain.  Relieve nausea.  Prevent migraines from coming back.  Treatment may also include:  Acupuncture.  Lifestyle changes like avoiding foods that trigger migraines.  Follow these instructions at home: Medicines  Take over-the-counter and prescription medicines only as told by your health care provider.  Do not drive or use heavy machinery while taking prescription pain medicine.  To prevent or treat constipation while you are taking prescription pain medicine, your health care provider may recommend that you: ? Drink enough fluid to keep your urine clear or pale yellow. ? Take over-the-counter or prescription medicines. ? Eat foods that are high in fiber, such as fresh fruits and vegetables, whole grains, and beans. ? Limit foods that are high in fat and processed sugars, such as fried and sweet foods. Lifestyle  Avoid alcohol use.  Do not use any products that contain nicotine or tobacco, such as cigarettes and e-cigarettes. If you need help quitting, ask your health care provider.  Get at least 8 hours of sleep every night.  Limit your stress. General instructions   Keep a journal to find out what may trigger your migraine headaches. For example, write down: ? What you eat and   drink. ? How much sleep you get. ? Any change to your diet or medicines.  If you have a migraine: ? Avoid things that make your symptoms worse, such as bright lights. ? It may help to lie down in a dark, quiet room. ? Do not drive or use heavy machinery. ? Ask your health care provider  what activities are safe for you while you are experiencing symptoms.  Keep all follow-up visits as told by your health care provider. This is important. Contact a health care provider if:  You develop symptoms that are different or more severe than your usual migraine symptoms. Get help right away if:  Your migraine becomes severe.  You have a fever.  You have a stiff neck.  You have vision loss.  Your muscles feel weak or like you cannot control them.  You start to lose your balance often.  You develop trouble walking.  You faint. This information is not intended to replace advice given to you by your health care provider. Make sure you discuss any questions you have with your health care provider. Document Released: 12/28/2004 Document Revised: 07/18/2015 Document Reviewed: 06/16/2015 Elsevier Interactive Patient Education  2017 [employer] Inc.   

## 2016-11-05 NOTE — Progress Notes (Signed)
History:  Brianna BorgRenee Palmer is a 31 y.o. [phone] at 15weeks who presents to clinic today for evaluation of headaches.  She has been having migraines for over 15 years but never seen anyone for treatment.  She historically took Excedrin for them but has been reliant on tylenol during the pregnancy.    Her headaches start right temporal and move to bilat frontal and temporal.  There is throbbing, blurred speech and dizziness.  Movement makes it worse, along with lights, noises, smells.  Her worst HAs happen upon awakening.  There is minimal nausea.  No aura.  Scalp/hair hurts.  The headaches last all day.   Neck tension and shoulder tension begin first often.  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      Past Medical History:  Diagnosis Date  . Headache     Social History   Social History  . Marital status: Married    Spouse name: N/A  . Number of children: 1  . Years of education: N/A   Occupational History  .  [employer]   Social History Main Topics  . Smoking status: Former Smoker    Packs/day: 0.25    Types: Cigarettes    Quit date: 07/01/2013  . Smokeless tobacco: Never Used  . Alcohol use No     Comment: socially  . Drug use: No  . Sexual activity: Yes    Partners: Male    Birth control/ protection: Condom   Other Topics Concern  . Not on file   Social History Narrative  . No narrative on file    Family History  Problem Relation Age of Onset  . Adopted: Yes  . Other Neg Hx   . Cancer Neg Hx   . Hypertension Neg Hx   . Stroke Neg Hx     No Known Allergies  Current Outpatient Prescriptions on File Prior to Visit  Medication Sig Dispense Refill  . acetaminophen (TYLENOL) 325 MG tablet Take 650 mg by mouth every 6 (six) hours as needed.    Marland Kitchen. oxymetazoline (AFRIN) 0.05 % nasal spray Place 1 spray into both nostrils 2 (two) times daily.    Bertram Gala. Polyethyl Glycol-Propyl Glycol (SYSTANE) 0.4-0.3 % SOLN  Place 1 drop into both eyes daily.    . Prenat-FeCbn-FeAspGl-FA-Omega (OB COMPLETE PETITE) 35-5-1-200 MG CAPS Take 1 tablet by mouth daily. 30 capsule 12  . Probiotic Product (PROBIOTIC PO) Take 1 capsule by mouth daily.     No current facility-administered medications on file prior to visit.      Review of Systems:  All pertinent positive/negative included in HPI, all other review of systems are negative   Objective:  Physical Exam BP 106/69   Pulse 82   Wt 151 lb 9.6 oz (68.8 kg)   LMP 07/21/2016 (Exact Date)   BMI 26.85 kg/m  CONSTITUTIONAL: Well-developed, well-nourished female in no acute distress.  EYES: EOM intact ENT: Normocephalic CARDIOVASCULAR: Regular rate  MUSCULOSKELETAL: Normal ROM SKIN: Warm, dry without erythema  NEUROLOGICAL: Alert, oriented, CN II-XII grossly intact, Appropriate balance PSYCH: Normal behavior, mood   Assessment & Plan:  Assessment: Migraine headache without aura Muscle spasm  Plan: Flexeril for muscle spasm/headache - sedation precautions given - break in half as appropriate Reglan for nausea/HA Okay to use benadryl if needed for HA Encouraged good self care - warm neck wrap, frequent stretching, BIOFREEZE Pt aware there are other options if these are  insufficient: trigger point injections/pred taper/fioricet.  Call with concerns RTC 2 months Follow-up in 2 months or sooner PRN  Bertram Denver, PA-C 11/05/2016 11:25 AM

## 2016-11-09 ENCOUNTER — Other Ambulatory Visit (HOSPITAL_COMMUNITY)
Admission: RE | Admit: 2016-11-09 | Discharge: 2016-11-09 | Disposition: A | Discharge status: Home / Self Care | Payer: BLUE CROSS/BLUE SHIELD | Source: Ambulatory Visit | Attending: Advanced Practice Midwife | Admitting: Advanced Practice Midwife

## 2016-11-09 ENCOUNTER — Encounter: Payer: Self-pay | Admitting: Advanced Practice Midwife

## 2016-11-09 ENCOUNTER — Ambulatory Visit (INDEPENDENT_AMBULATORY_CARE_PROVIDER_SITE_OTHER): Payer: BLUE CROSS/BLUE SHIELD | Admitting: Advanced Practice Midwife

## 2016-11-09 VITALS — BP 105/69 | HR 80 | Wt 153.0 lb

## 2016-11-09 DIAGNOSIS — Z3480 Encounter for supervision of other normal pregnancy, unspecified trimester: Secondary | ICD-10-CM

## 2016-11-09 DIAGNOSIS — O26899 Other specified pregnancy related conditions, unspecified trimester: Secondary | ICD-10-CM

## 2016-11-09 DIAGNOSIS — Z3A15 15 weeks gestation of pregnancy: Secondary | ICD-10-CM | POA: Insufficient documentation

## 2016-11-09 DIAGNOSIS — N898 Other specified noninflammatory disorders of vagina: Secondary | ICD-10-CM | POA: Diagnosis not present

## 2016-11-09 DIAGNOSIS — Z363 Encounter for antenatal screening for malformations: Secondary | ICD-10-CM

## 2016-11-09 DIAGNOSIS — O26892 Other specified pregnancy related conditions, second trimester: Secondary | ICD-10-CM | POA: Insufficient documentation

## 2016-11-09 MED ORDER — TERCONAZOLE 0.4 % VA CREA: 1.0000 | TOPICAL_CREAM | Freq: Every day | VAGINAL | 2 refills | Status: DC

## 2016-11-09 NOTE — Patient Instructions (Signed)

## 2016-11-09 NOTE — Progress Notes (Signed)
   PRENATAL VISIT NOTE  Subjective:  Brianna Palmer is a 31 y.o. [id number] at 7057w6d being seen today for ongoing prenatal care.  She is currently monitored for the following issues for this low-risk pregnancy and has Depressive disorder, not elsewhere classified; Supervision of other normal pregnancy, antepartum; Herpes genitalis in women; Previous cesarean delivery affecting pregnancy; Migraine without aura and with status migrainosus, not intractable; and Muscle spasm on her problem list.  Patient reports vaginal irritation.   . Vag. Bleeding: None.  Movement: Present. Denies leaking of fluid.   The following portions of the patient's history were reviewed and updated as appropriate: allergies, current medications, past family history, past medical history, past social history, past surgical history and problem list. Problem list updated.  Objective:   Vitals:   11/09/16 0859  BP: 105/69  Pulse: 80  Weight: 153 lb (69.4 kg)    Fetal Status:     Movement: Present     General:  Alert, oriented and cooperative. Patient is in no acute distress.  Skin: Skin is warm and dry. No rash noted.   Cardiovascular: Normal heart rate noted  Respiratory: Normal respiratory effort, no problems with respiration noted  Abdomen: Soft, gravid, appropriate for gestational age.  Pain/Pressure: Present     Pelvic: Cervical exam deferred        Extremities: Normal range of motion.  Edema: None  Mental Status:  Normal mood and affect. Normal behavior. Normal judgment and thought content.   Assessment and Plan:  Pregnancy: G5P1031 at 5857w6d  1. Screening, antenatal, for malformation by ultrasound  - AFP, Serum, Open Spina Bifida  2. Vaginal discharge during pregnancy, antepartum      Wet prep sent      Terazol 7 ordered - Cervicovaginal ancillary only  3. Supervision of other normal pregnancy, antepartum     Discussed diet and probiotics, add greek yogurt  Preterm labor symptoms and general obstetric  precautions including but not limited to vaginal bleeding, contractions, leaking of fluid and fetal movement were reviewed in detail with the patient. Please refer to After Visit Summary for other counseling recommendations.  RTO 4 wks  Wynelle BourgeoisMarie Lonya Johannesen, CNM

## 2016-11-11 LAB — AFP, SERUM, OPEN SPINA BIFIDA
AFP MOM: 1.15
AFP Value: 40.5 ng/mL
GEST. AGE ON COLLECTION DATE: 15.9 wk
Maternal Age At EDD: 30.4 yr
OSBR RISK 1 IN: 10000
Test Results:: NEGATIVE
Weight: 152 [lb_av]

## 2016-11-11 LAB — CERVICOVAGINAL ANCILLARY ONLY
Bacterial vaginitis: NEGATIVE
Candida vaginitis: POSITIVE — AB

## 2016-11-24 ENCOUNTER — Encounter (HOSPITAL_COMMUNITY): Payer: Self-pay | Admitting: Family Medicine

## 2016-12-01 ENCOUNTER — Other Ambulatory Visit: Payer: Self-pay | Admitting: Family Medicine

## 2016-12-01 ENCOUNTER — Ambulatory Visit (HOSPITAL_COMMUNITY)
Admission: RE | Admit: 2016-12-01 | Discharge: 2016-12-01 | Disposition: A | Discharge status: Home / Self Care | Payer: BLUE CROSS/BLUE SHIELD | Source: Ambulatory Visit | Attending: Family Medicine | Admitting: Family Medicine

## 2016-12-01 DIAGNOSIS — Z3689 Encounter for other specified antenatal screening: Secondary | ICD-10-CM | POA: Insufficient documentation

## 2016-12-01 DIAGNOSIS — Z3A19 19 weeks gestation of pregnancy: Secondary | ICD-10-CM

## 2016-12-01 DIAGNOSIS — Z3490 Encounter for supervision of normal pregnancy, unspecified, unspecified trimester: Secondary | ICD-10-CM

## 2016-12-10 ENCOUNTER — Encounter: Payer: Self-pay | Admitting: Family Medicine

## 2016-12-10 ENCOUNTER — Ambulatory Visit (INDEPENDENT_AMBULATORY_CARE_PROVIDER_SITE_OTHER): Payer: BLUE CROSS/BLUE SHIELD | Admitting: Family Medicine

## 2016-12-10 VITALS — BP 126/72 | HR 95 | Wt 160.0 lb

## 2016-12-10 DIAGNOSIS — O9989 Other specified diseases and conditions complicating pregnancy, childbirth and the puerperium: Secondary | ICD-10-CM

## 2016-12-10 DIAGNOSIS — M9903 Segmental and somatic dysfunction of lumbar region: Secondary | ICD-10-CM

## 2016-12-10 DIAGNOSIS — M549 Dorsalgia, unspecified: Secondary | ICD-10-CM

## 2016-12-10 DIAGNOSIS — Z3480 Encounter for supervision of other normal pregnancy, unspecified trimester: Secondary | ICD-10-CM

## 2016-12-10 DIAGNOSIS — Z3482 Encounter for supervision of other normal pregnancy, second trimester: Secondary | ICD-10-CM | POA: Diagnosis not present

## 2016-12-10 DIAGNOSIS — A6009 Herpesviral infection of other urogenital tract: Secondary | ICD-10-CM

## 2016-12-10 DIAGNOSIS — O99891 Other specified diseases and conditions complicating pregnancy: Secondary | ICD-10-CM

## 2016-12-10 DIAGNOSIS — O34219 Maternal care for unspecified type scar from previous cesarean delivery: Secondary | ICD-10-CM

## 2016-12-10 NOTE — Progress Notes (Signed)
Patient was complaining of hip pain this morning. Earlier in the week she had some leg pain- left leg. Brianna StammerJennifer Ivylynn Hoppes RNBSN

## 2016-12-10 NOTE — Progress Notes (Signed)
   PRENATAL VISIT NOTE  Subjective:  Brianna Palmer is a 31 y.o. [id number] at 1539w2d being seen today for ongoing prenatal care.  She is currently monitored for the following issues for this low-risk pregnancy and has Depressive disorder, not elsewhere classified; Supervision of other normal pregnancy, antepartum; Herpes genitalis in women; Previous cesarean delivery affecting pregnancy; Migraine without aura and with status migrainosus, not intractable; and Muscle spasm on their problem list.  Patient reports backache for 2 days, worse on left. Initially had radiation down left leg to toes, but now just in left lower back. Also having some right hip pain. Worse with stairs.  Contractions: Not present. Vag. Bleeding: None.  Movement: Present. Denies leaking of fluid.   The following portions of the patient's history were reviewed and updated as appropriate: allergies, current medications, past family history, past medical history, past social history, past surgical history and problem list. Problem list updated.  Objective:   Vitals:   12/10/16 1104  BP: 126/72  Pulse: 95  Weight: 160 lb (72.6 kg)    Fetal Status: Fetal Heart Rate (bpm): 159   Movement: Present     General:  Alert, oriented and cooperative. Patient is in no acute distress.  Skin: Skin is warm and dry. No rash noted.   Cardiovascular: Normal heart rate noted  Respiratory: Normal respiratory effort, no problems with respiration noted  Abdomen: Soft, gravid, appropriate for gestational age. Pain/Pressure: Absent     Pelvic:  Cervical exam deferred        MSK: Restriction, tenderness, tissue texture changes, and paraspinal spasm in the left lumbar spine  Neuro: Moves all four extremities with no focal neurological deficit  Extremities: Normal range of motion.  Edema: None  Mental Status: Normal mood and affect. Normal behavior. Normal judgment and thought content.   OSE: Head   Cervical   Thoracic   Rib   Lumbar L5 ESRL,  L1 ESRR  Sacrum L/L  Pelvis R ant innom    Assessment and Plan:  Pregnancy: G5P1031 at 4539w2d  1. Supervision of other normal pregnancy, antepartum FHT and Fh normal  2. Herpes genitalis in women Needs suppression at 36 weeks  3. Previous cesarean delivery affecting pregnancy VBAC desired  4. Back pain affecting pregnancy in second trimester 5. Somatic dysfunction of lumbar region OMT done after patient permission. HVLA technique utilized. Patient tolerated procedure well.    Preterm labor symptoms and general obstetric precautions including but not limited to vaginal bleeding, contractions, leaking of fluid and fetal movement were reviewed in detail with the patient. Please refer to After Visit Summary for other counseling recommendations.  No Follow-up on file.  Levie HeritageStinson, Jacob J, DO

## 2016-12-17 ENCOUNTER — Ambulatory Visit (INDEPENDENT_AMBULATORY_CARE_PROVIDER_SITE_OTHER): Payer: BLUE CROSS/BLUE SHIELD | Admitting: Family Medicine

## 2016-12-17 VITALS — BP 113/66 | HR 96 | Wt 163.0 lb

## 2016-12-17 DIAGNOSIS — M549 Dorsalgia, unspecified: Secondary | ICD-10-CM | POA: Diagnosis not present

## 2016-12-17 DIAGNOSIS — M9903 Segmental and somatic dysfunction of lumbar region: Secondary | ICD-10-CM

## 2016-12-17 DIAGNOSIS — Z3482 Encounter for supervision of other normal pregnancy, second trimester: Secondary | ICD-10-CM

## 2016-12-17 DIAGNOSIS — O9989 Other specified diseases and conditions complicating pregnancy, childbirth and the puerperium: Secondary | ICD-10-CM

## 2016-12-17 DIAGNOSIS — Z3480 Encounter for supervision of other normal pregnancy, unspecified trimester: Secondary | ICD-10-CM

## 2016-12-17 DIAGNOSIS — O99891 Other specified diseases and conditions complicating pregnancy: Secondary | ICD-10-CM

## 2016-12-17 NOTE — Progress Notes (Signed)
   PRENATAL VISIT NOTE  Subjective:  Brianna Palmer is a 31 y.o. G5P1031 at 401w2d being seen today for ongoing prenatal care.  She is currently monitored for the following issues for this low-risk pregnancy and has Depressive disorder, not elsewhere classified; Supervision of other normal pregnancy, antepartum; Herpes genitalis in women; Previous cesarean delivery affecting pregnancy; Migraine without aura and with status migrainosus, not intractable; and Muscle spasm on their problem list.  Patient reports right hip pain. Improved with manipulation, worsened again. Had difficulty sleeping last night..  Contractions: Not present. Vag. Bleeding: None.  Movement: Present. Denies leaking of fluid.   The following portions of the patient's history were reviewed and updated as appropriate: allergies, current medications, past family history, past medical history, past social history, past surgical history and problem list. Problem list updated.  Objective:   Vitals:   12/17/16 0817  BP: 113/66  Pulse: 96  Weight: 163 lb (73.9 kg)    Fetal Status: Fetal Heart Rate (bpm): 140   Movement: Present     General:  Alert, oriented and cooperative. Patient is in no acute distress.  Skin: Skin is warm and dry. No rash noted.   Cardiovascular: Normal heart rate noted  Respiratory: Normal respiratory effort, no problems with respiration noted  Abdomen: Soft, gravid, appropriate for gestational age. Pain/Pressure: Absent     Pelvic:  Cervical exam deferred        MSK: Restriction, tenderness, tissue texture changes, and paraspinal spasm in the right spine  Neuro: Moves all four extremities with no focal neurological deficit  Extremities: Normal range of motion.  Edema: None  Mental Status: Normal mood and affect. Normal behavior. Normal judgment and thought content.   OSE: Head   Cervical   Thoracic   Rib   Lumbar L4 ESRR  Sacrum L/L  Pelvis Right ant innom    Assessment and Plan:  Pregnancy:  G5P1031 at 631w2d  1. Supervision of other normal pregnancy, antepartum FHT normal  2. Back pain affecting pregnancy in second trimester 3. Somatic dysfunction of lumbar region OMT done after patient permission. HVLA technique utilized. Patient tolerated procedure well.     Preterm labor symptoms and general obstetric precautions including but not limited to vaginal bleeding, contractions, leaking of fluid and fetal movement were reviewed in detail with the patient. Please refer to After Visit Summary for other counseling recommendations.  No Follow-up on file.  Levie HeritageStinson, Niko Penson J, DO

## 2016-12-18 ENCOUNTER — Inpatient Hospital Stay (HOSPITAL_COMMUNITY)
Admission: AD | Admit: 2016-12-18 | Discharge: 2016-12-18 | Disposition: A | Discharge status: Home / Self Care | Payer: BLUE CROSS/BLUE SHIELD | Source: Ambulatory Visit | Attending: Obstetrics and Gynecology | Admitting: Obstetrics and Gynecology

## 2016-12-18 ENCOUNTER — Encounter (HOSPITAL_COMMUNITY): Payer: Self-pay

## 2016-12-18 DIAGNOSIS — N898 Other specified noninflammatory disorders of vagina: Secondary | ICD-10-CM | POA: Insufficient documentation

## 2016-12-18 DIAGNOSIS — O26892 Other specified pregnancy related conditions, second trimester: Secondary | ICD-10-CM | POA: Insufficient documentation

## 2016-12-18 DIAGNOSIS — R1030 Lower abdominal pain, unspecified: Secondary | ICD-10-CM | POA: Diagnosis not present

## 2016-12-18 DIAGNOSIS — Z3A21 21 weeks gestation of pregnancy: Secondary | ICD-10-CM | POA: Diagnosis not present

## 2016-12-18 DIAGNOSIS — K5900 Constipation, unspecified: Secondary | ICD-10-CM | POA: Diagnosis not present

## 2016-12-18 DIAGNOSIS — Z87891 Personal history of nicotine dependence: Secondary | ICD-10-CM | POA: Insufficient documentation

## 2016-12-18 LAB — URINALYSIS, ROUTINE W REFLEX MICROSCOPIC
Bilirubin Urine: NEGATIVE
Glucose, UA: NEGATIVE mg/dL
HGB URINE DIPSTICK: NEGATIVE
Ketones, ur: NEGATIVE mg/dL
LEUKOCYTES UA: NEGATIVE
Nitrite: NEGATIVE
PROTEIN: NEGATIVE mg/dL
SPECIFIC GRAVITY, URINE: 1.009 (ref 1.005–1.030)
pH: 7 (ref 5.0–8.0)

## 2016-12-18 LAB — WET PREP, GENITAL
CLUE CELLS WET PREP: NONE SEEN
SPERM: NONE SEEN
Trich, Wet Prep: NONE SEEN
YEAST WET PREP: NONE SEEN

## 2016-12-18 MED ORDER — ACETAMINOPHEN 325 MG PO TABS
650.0000 mg | ORAL_TABLET | Freq: Once | ORAL | Status: AC
  Administered 2016-12-18: 650 mg via ORAL
  Filled 2016-12-18: qty 2

## 2016-12-18 NOTE — MAU Note (Signed)
Patient presents with pelvic pain, back pain and vaginal discharge.  Patient had routine prenatal visit yesterday and Dr. Adrian BlackwaterStinson did a lumbar adjustment.

## 2016-12-18 NOTE — MAU Provider Note (Signed)
Chief Complaint:  Abdominal Pain; Pelvic Pain; and Vaginal Discharge   None     HPI: Brianna Palmer is a 31 y.o. Z6X0960 at [redacted]w[redacted]d who presents to MAU reporting lower abdominal pain that started yesterday.  She describes the pain as sharp in nature.  Pain comes and goes and feels like a tightening.  Patient states that she tried Tylenol without any improvement.  Patient also noticed increased discharge yesterday with lower abdominal pain.  Discharge is white and increased in amount.  Denies odor, irritation, or blood.  She denies fevers.  No new medications.  Tightening of her abdominal pain occurs every 5 minutes. H/o IBS constipation type. Has not had a BM in 2-3 days.   Denies leakage of fluid or vaginal bleeding. Good fetal movement.   Pregnancy Course:  Prenatal care at [facility] H/o c-section, migraine, herpes   Past Medical History: Past Medical History:  Diagnosis Date  . Headache     Past obstetric history: OB History  Gravida Para Term Preterm AB Living  5 1 1   3 1   SAB TAB Ectopic Multiple Live Births  2 1     1     # Outcome Date GA Lbr Len/2nd Weight Sex Delivery Anes PTL Lv  5 Current           4 Term 03/30/12 [redacted]w[redacted]d  2.83 kg (6 lb 3.8 oz) M CS-LVertical EPI  LIV  3 SAB 2012          2 SAB 2012 [redacted]w[redacted]d         1 TAB 2007              Past Surgical History: Past Surgical History:  Procedure Laterality Date  . BUNIONECTOMY    . CESAREAN SECTION N/A 03/30/2012   Procedure: CESAREAN SECTION;  Surgeon: Antionette Char, MD;  Location: [facility];  Service: Obstetrics;  Laterality: N/A;  Primary Cesarean Section Delivery Baby Boy @ 0157, Apgars 8/9     Family History: Family History  Adopted: Yes  Problem Relation Age of Onset  . Other Neg Hx   . Cancer Neg Hx   . Hypertension Neg Hx   . Stroke Neg Hx     Social History: Social History   Tobacco Use  . Smoking status: Former Smoker    Packs/day: 0.25    Types: Cigarettes    Last attempt to quit: 07/01/2013     Years since quitting: 3.4  . Smokeless tobacco: Never Used  Substance Use Topics  . Alcohol use: No    Alcohol/week: 0.0 oz    Comment: socially  . Drug use: No    Allergies: No Known Allergies  Meds:  Medications Prior to Admission  Medication Sig Dispense Refill Last Dose  . acetaminophen (TYLENOL) 325 MG tablet Take 650 mg by mouth every 6 (six) hours as needed.   Taking  . cyclobenzaprine (FLEXERIL) 10 MG tablet Take 1 tablet (10 mg total) by mouth every 8 (eight) hours as needed for muscle spasms. 40 tablet 2 Taking  . metoCLOPramide (REGLAN) 10 MG tablet Take 1 tablet (10 mg total) by mouth 3 (three) times daily as needed for nausea. (Patient not taking: Reported on 12/10/2016) 30 tablet 2 Not Taking  . oxymetazoline (AFRIN) 0.05 % nasal spray Place 1 spray into both nostrils 2 (two) times daily.   Taking  . Polyethyl Glycol-Propyl Glycol (SYSTANE) 0.4-0.3 % SOLN Place 1 drop into both eyes daily.   Taking  . Prenat-FeCbn-FeAspGl-FA-Omega (OB  COMPLETE PETITE) 35-5-1-200 MG CAPS Take 1 tablet by mouth daily. 30 capsule 12 Taking  . Probiotic Product (PROBIOTIC PO) Take 1 capsule by mouth daily.   Taking  . terconazole (TERAZOL 7) 0.4 % vaginal cream Place 1 applicator vaginally at bedtime. (Patient not taking: Reported on 12/10/2016) 45 g 2 Not Taking    I have reviewed patient's Past Medical Hx, Surgical Hx, Family Hx, Social Hx, medications and allergies.   ROS:  All systems reviewed and are negative for acute change except as noted in the HPI.   Physical Exam   Patient Vitals for the past 24 hrs:  BP Temp Pulse Resp  12/18/16 1319 102/77 98.2 F (36.8 C) 98 16   Constitutional: Well-developed, well-nourished female in no acute distress.  Cardiovascular: normal rate and rhythm, pulses intact Respiratory: normal rate and effort.  GI: Abd soft, tender to palpation in lower segment, mildly distended, gravid appropriate for gestational age. No guarding or rebound  tenderness. MS: Extremities nontender, no edema, normal ROM Neurologic: Alert and oriented x 4.  GU: Neg CVAT. Pelvic: NEFG, physiologic discharge, no blood, cervix clean. No CMT. Adnexal tenderness with bimanual exam.  Psych: normal mood and affect  Labs: Results for orders placed or performed during the hospital encounter of 12/18/16 (from the past 24 hour(s))  Urinalysis, Routine w reflex microscopic     Status: Abnormal   Collection Time: 12/18/16  1:08 PM  Result Value Ref Range   Color, Urine STRAW (A) YELLOW   APPearance CLEAR CLEAR   Specific Gravity, Urine 1.009 1.005 - 1.030   pH 7.0 5.0 - 8.0   Glucose, UA NEGATIVE NEGATIVE mg/dL   Hgb urine dipstick NEGATIVE NEGATIVE   Bilirubin Urine NEGATIVE NEGATIVE   Ketones, ur NEGATIVE NEGATIVE mg/dL   Protein, ur NEGATIVE NEGATIVE mg/dL   Nitrite NEGATIVE NEGATIVE   Leukocytes, UA NEGATIVE NEGATIVE    Imaging:  [place] Mfm Ob Comp + 14 Wk  Result Date: 12/02/2016 ----------------------------------------------------------------------  OBSTETRICS REPORT                       (Signed Final 12/02/2016 09:15 pm) ---------------------------------------------------------------------- Patient Info  ID #:       [id number]                          D.O.B.:  09/12/1985 (31 yrs)  Name:       Brianna Palmer                   Visit Date: 12/01/2016 10:48 am ---------------------------------------------------------------------- Performed By  Performed By:     Marcellina MillinKelly L Moser          Ref. Address:      [address]                    [address]                                                              [address]  [place], [place]                                                              [address]  Attending:        Particia Nearing MD       Location:          [facility]  Referred By:      [name]                    MD ---------------------------------------------------------------------- Orders   #   Description                                 Code   1  [place] MFM OB COMP + 14 WK                      76805.01  ----------------------------------------------------------------------   #  Ordered By               Order #        Accession #    Episode #   1  Tinnie Gens              [id number]      [id number]     [id number]  ---------------------------------------------------------------------- Indications   [redacted] weeks gestation of pregnancy                Z3A.19   Encounter for fetal anatomic survey            Z36.89   Previous cesarean delivery, antepartum         O34.219  ---------------------------------------------------------------------- OB History  Gravidity:    5         Term:   1        Prem:   0         SAB:   2  TOP:          1       Ectopic:  0        Living: 1 ---------------------------------------------------------------------- Fetal Evaluation  Num Of Fetuses:     1  Fetal Heart         145  Rate(bpm):  Cardiac Activity:   Observed  Presentation:       Cephalic  Placenta:           Anterior, above cervical os  P. Cord Insertion:  Visualized  Amniotic Fluid  AFI FV:      Subjectively within normal limits                              Largest Pocket(cm)                              3.5 ---------------------------------------------------------------------- Biometry  BPD:      45.5  mm     G. Age:  19w 5d         81  %    CI:         75.78  %    70 - 86  FL/HC:       18.6  %    16.1 - 18.3  HC:      165.7  mm     G. Age:  19w 2d         56  %    HC/AC:       1.20       1.09 - 1.39  AC:        138  mm     G. Age:  19w 1d         53  %    FL/BPD:      67.9  %  FL:       30.9  mm     G. Age:  19w 4d         64  %    FL/AC:       22.4  %    20 - 24  HUM:      27.5  mm     G. Age:  18w 6d         44  %  Est. FW:     290   gm    0 lb 10 oz     52  % ---------------------------------------------------------------------- Gestational Age  LMP:           19w 0d         Date:  07/21/16                 EDD:    04/27/17  U/S Today:     19w 3d                                        EDD:    04/24/17  Best:          19w 0d     Det. By:  LMP  (07/21/16)          EDD:    04/27/17 ---------------------------------------------------------------------- Anatomy  Cranium:               Appears normal         Aortic Arch:            Appears normal  Cavum:                 Appears normal         Ductal Arch:            Appears normal  Ventricles:            Appears normal         Diaphragm:              Appears normal  Choroid Plexus:        Appears normal         Stomach:                Appears normal, left                                                                        sided  Cerebellum:  Appears normal         Abdomen:                Appears normal  Posterior Fossa:       Appears normal         Abdominal Wall:         Appears nml (cord                                                                        insert, abd wall)  Nuchal Fold:           Appears normal         Cord Vessels:           Appears normal (3                                                                        vessel cord)  Face:                  Orbits nl; profile not Kidneys:                Appear normal                         well visualized  Lips:                  Appears normal         Bladder:                Appears normal  Thoracic:              Appears normal         Spine:                  Appears normal  Heart:                 Appears normal         Upper Extremities:      Appears normal                         (4CH, axis, and situs  RVOT:                  Appears normal         Lower Extremities:      Appears normal  LVOT:                  Appears normal  Other:  Fetus appears to be a female. Heels and Left 5th digit visualized. Nasal          bone visualized. ---------------------------------------------------------------------- Cervix Uterus Adnexa  Cervix  Length:            3.7  cm.  Normal  appearance by transabdominal scan. ---------------------------------------------------------------------- Impression  SIUP at 19+0 weeks  Normal detailed fetal anatomy; limited views of profile  Markers of aneuploidy: none  Normal amniotic fluid volume  Measurements consistent with LMP dating ---------------------------------------------------------------------- Recommendations  Follow-up as clinically indicated ----------------------------------------------------------------------                 Particia Nearing, MD Electronically Signed Final Report   12/02/2016 09:15 pm ----------------------------------------------------------------------   MAU Course: Vitals and nursing notes reviewed I have ordered labs and reviewed them UA and wet prep unremarkable Cultures pending Doppler FHT 148 Monitored on toco with no activity  Treatments given in MAU: Tylenol  MDM: Plan of care reviewed with patient, including labs and tests ordered and medical treatment.   Assessment: 1. Lower abdominal pain   2. Constipation, unspecified constipation type   3. Vaginal discharge     Plan: Discharge home in stable condition Bowel regimen encouraged - medications safe in pregnancy discussed Bleeding precautions given Handout given Follow-up with OB provider   Caryl Ada, DO OB Fellow [facility], [facility] 12/18/2016 1:19 PM

## 2016-12-18 NOTE — Discharge Instructions (Signed)
Irritable Bowel Syndrome, Adult Irritable bowel syndrome (IBS) is not one specific disease. It is a group of symptoms that affects the organs responsible for digestion (gastrointestinal or GI tract). To regulate how your GI tract works, your body sends signals back and forth between your intestines and your brain. If you have IBS, there may be a problem with these signals. As a result, your GI tract does not function normally. Your intestines may become more sensitive and overreact to certain things. This is especially true when you eat certain foods or when you are under stress. There are four types of IBS. These may be determined based on the consistency of your stool:  IBS with diarrhea.  IBS with constipation.  Mixed IBS.  Unsubtyped IBS.  It is important to know which type of IBS you have. Some treatments are more likely to be helpful for certain types of IBS. What are the causes? The exact cause of IBS is not known. What increases the risk? You may have a higher risk of IBS if:  You are a woman.  You are younger than 30 years old.  You have a family history of IBS.  You have mental health problems.  You have had bacterial infection of your GI tract.  What are the signs or symptoms? Symptoms of IBS vary from person to person. The main symptom is abdominal pain or discomfort. Additional symptoms usually include one or more of the following:  Diarrhea, constipation, or both.  Abdominal swelling or bloating.  Feeling full or sick after eating a small or regular-size meal.  Frequent gas.  Mucus in the stool.  A feeling of having more stool left after a bowel movement.  Symptoms tend to come and go. They may be associated with stress, psychiatric conditions, or nothing at all. How is this diagnosed? There is no specific test to diagnose IBS. Your health care provider will make a diagnosis based on a physical exam, medical history, and your symptoms. You may have other  tests to rule out other conditions that may be causing your symptoms. These may include:  Blood tests.  X-rays.  CT scan.  Endoscopy and colonoscopy. This is a test in which your GI tract is viewed with a long, thin, flexible tube.  How is this treated? There is no cure for IBS, but treatment can help relieve symptoms. IBS treatment often includes:  Changes to your diet, such as: ? Eating more fiber. ? Avoiding foods that cause symptoms. ? Drinking more water. ? Eating regular, medium-sized portioned meals.  Medicines. These may include: ? Fiber supplements if you have constipation. ? Medicine to control diarrhea (antidiarrheal medicines). ? Medicine to help control muscle spasms in your GI tract (antispasmodic medicines). ? Medicines to help with any mental health issues, such as antidepressants or tranquilizers.  Therapy. ? Talk therapy may help with anxiety, depression, or other mental health issues that can make IBS symptoms worse.  Stress reduction. ? Managing your stress can help keep symptoms under control.  Follow these instructions at home:  Take medicines only as directed by your health care provider.  Eat a healthy diet. ? Avoid foods and drinks with added sugar. ? Include more whole grains, fruits, and vegetables gradually into your diet. This may be especially helpful if you have IBS with constipation. ? Avoid any foods and drinks that make your symptoms worse. These may include dairy products and caffeinated or carbonated drinks. ? Do not eat large meals. ? Drink enough   fluid to keep your urine clear or pale yellow.  Exercise regularly. Ask your health care provider for recommendations of good activities for you.  Keep all follow-up visits as directed by your health care provider. This is important. Contact a health care provider if:  You have constant pain.  You have trouble or pain with swallowing.  You have worsening diarrhea. Get help right away  if:  You have severe and worsening abdominal pain.  You have diarrhea and: ? You have a rash, stiff neck, or severe headache. ? You are irritable, sleepy, or difficult to awaken. ? You are weak, dizzy, or extremely thirsty.  You have bright red blood in your stool or you have black tarry stools.  You have unusual abdominal swelling that is painful.  You vomit continuously.  You vomit blood (hematemesis).  You have both abdominal pain and a fever. This information is not intended to replace advice given to you by your health care provider. Make sure you discuss any questions you have with your health care provider. Document Released: 12/28/2004 Document Revised: 05/30/2015 Document Reviewed: 09/14/2013 Elsevier Interactive Patient Education  2018 [employer]  

## 2016-12-21 LAB — GC/CHLAMYDIA PROBE AMP (~~LOC~~) NOT AT ARMC
CHLAMYDIA, DNA PROBE: NEGATIVE
NEISSERIA GONORRHEA: NEGATIVE

## 2016-12-24 ENCOUNTER — Ambulatory Visit (INDEPENDENT_AMBULATORY_CARE_PROVIDER_SITE_OTHER): Payer: BLUE CROSS/BLUE SHIELD | Admitting: Physician Assistant

## 2016-12-24 ENCOUNTER — Encounter: Payer: Self-pay | Admitting: Physician Assistant

## 2016-12-24 VITALS — BP 126/73 | HR 94 | Wt 164.0 lb

## 2016-12-24 DIAGNOSIS — G43001 Migraine without aura, not intractable, with status migrainosus: Secondary | ICD-10-CM

## 2016-12-24 DIAGNOSIS — O26892 Other specified pregnancy related conditions, second trimester: Secondary | ICD-10-CM | POA: Diagnosis not present

## 2016-12-24 DIAGNOSIS — R51 Headache: Secondary | ICD-10-CM

## 2016-12-24 DIAGNOSIS — O26899 Other specified pregnancy related conditions, unspecified trimester: Secondary | ICD-10-CM | POA: Insufficient documentation

## 2016-12-24 DIAGNOSIS — M62838 Other muscle spasm: Secondary | ICD-10-CM

## 2016-12-24 DIAGNOSIS — R519 Headache, unspecified: Secondary | ICD-10-CM | POA: Insufficient documentation

## 2016-12-24 NOTE — Progress Notes (Signed)
History:  Brianna Palmer is a 31 y.o. [phone] who presents to clinic today for return HA evaluation.  Her headaches are improved.   She has had nausea with the HA.   She has not used the reglan but says the flexeril is helpful.  She only takes it in the evening and it is helpful.    She notes she is not aggressive enough with taking medication OR with drinking water.  HIT6:54 Number of days in the last 4 weeks with:  Severe headache: 3 Moderate headache: 16 Mild headache: 5  No headache: 4   Past Medical History:  Diagnosis Date  . Headache     Social History   Socioeconomic History  . Marital status: Divorced    Spouse name: Not on file  . Number of children: 1  . Years of education: Not on file  . Highest education level: Not on file  Social Needs  . Financial resource strain: Not on file  . Food insecurity - worry: Not on file  . Food insecurity - inability: Not on file  . Transportation needs - medical: Not on file  . Transportation needs - non-medical: Not on file  Occupational History    Employer: [employer]  Tobacco Use  . Smoking status: Former Smoker    Packs/day: 0.25    Types: Cigarettes    Last attempt to quit: 07/01/2013    Years since quitting: 3.4  . Smokeless tobacco: Never Used  Substance and Sexual Activity  . Alcohol use: No    Alcohol/week: 0.0 oz    Comment: socially  . Drug use: No  . Sexual activity: Yes    Partners: Male  Other Topics Concern  . Not on file  Social History Narrative  . Not on file    Family History  Adopted: Yes  Problem Relation Age of Onset  . Other Neg Hx   . Cancer Neg Hx   . Hypertension Neg Hx   . Stroke Neg Hx     No Known Allergies  Current Outpatient Medications on File Prior to Visit  Medication Sig Dispense Refill  . cyclobenzaprine (FLEXERIL) 10 MG tablet Take 1 tablet (10 mg total) by mouth every 8 (eight) hours as needed for muscle spasms. 40 tablet 2  . Prenat-FeCbn-FeAspGl-FA-Omega (OB  COMPLETE PETITE) 35-5-1-200 MG CAPS Take 1 tablet by mouth daily. 30 capsule 12  . metoCLOPramide (REGLAN) 10 MG tablet Take 1 tablet (10 mg total) by mouth 3 (three) times daily as needed for nausea. (Patient not taking: Reported on 12/10/2016) 30 tablet 2  . Polyethyl Glycol-Propyl Glycol (SYSTANE) 0.4-0.3 % SOLN Place 1 drop into both eyes daily.    . Probiotic Product (PROBIOTIC PO) Take 1 capsule by mouth daily.     No current facility-administered medications on file prior to visit.      Review of Systems:  All pertinent positive/negative included in HPI, all other review of systems are negative   Objective:  Physical Exam BP 126/73   Pulse 94   Wt 164 lb (74.4 kg)   LMP 07/21/2016 (Exact Date)   BMI 29.05 kg/m  CONSTITUTIONAL: Well-developed, well-nourished female in no acute distress.  EYES: EOM intact ENT: Normocephalic CARDIOVASCULAR: Regular rate  . RESPIRATORY: Normal rate.  MUSCULOSKELETAL: Normal ROM SKIN: Warm, dry without erythema  NEUROLOGICAL: Alert, oriented, CN II-XII grossly intact, Appropriate balance PSYCH: Normal behavior, mood   Assessment & Plan:  Assessment: 1. Migraine without aura and with status migrainosus, not intractable  2. Muscle spasm   3. Pregnancy headache, antepartum      Plan: Use reglan - especially with nausea Okay to use 1/2 tab of flexeril EARLIER in the headache DRINK more water Follow-up in 2 months or sooner PRN  Bertram Denvereague Clark, Karen E, PA-C 12/24/2016 11:23 AM

## 2016-12-24 NOTE — Patient Instructions (Signed)

## 2016-12-27 ENCOUNTER — Ambulatory Visit (INDEPENDENT_AMBULATORY_CARE_PROVIDER_SITE_OTHER): Payer: BLUE CROSS/BLUE SHIELD | Admitting: Family Medicine

## 2016-12-27 ENCOUNTER — Encounter: Payer: BLUE CROSS/BLUE SHIELD | Admitting: Family Medicine

## 2016-12-27 VITALS — BP 120/69 | HR 92 | Wt 163.0 lb

## 2016-12-27 DIAGNOSIS — G43001 Migraine without aura, not intractable, with status migrainosus: Secondary | ICD-10-CM

## 2016-12-27 DIAGNOSIS — M9902 Segmental and somatic dysfunction of thoracic region: Secondary | ICD-10-CM | POA: Diagnosis not present

## 2016-12-27 DIAGNOSIS — O9989 Other specified diseases and conditions complicating pregnancy, childbirth and the puerperium: Secondary | ICD-10-CM | POA: Diagnosis not present

## 2016-12-27 DIAGNOSIS — A6009 Herpesviral infection of other urogenital tract: Secondary | ICD-10-CM

## 2016-12-27 DIAGNOSIS — M549 Dorsalgia, unspecified: Secondary | ICD-10-CM

## 2016-12-27 DIAGNOSIS — O99891 Other specified diseases and conditions complicating pregnancy: Secondary | ICD-10-CM

## 2016-12-27 DIAGNOSIS — M9903 Segmental and somatic dysfunction of lumbar region: Secondary | ICD-10-CM | POA: Diagnosis not present

## 2016-12-27 DIAGNOSIS — M9901 Segmental and somatic dysfunction of cervical region: Secondary | ICD-10-CM | POA: Diagnosis not present

## 2016-12-27 DIAGNOSIS — Z3482 Encounter for supervision of other normal pregnancy, second trimester: Secondary | ICD-10-CM

## 2016-12-27 DIAGNOSIS — Z3480 Encounter for supervision of other normal pregnancy, unspecified trimester: Secondary | ICD-10-CM

## 2016-12-27 MED ORDER — VALACYCLOVIR HCL 500 MG PO TABS: ORAL_TABLET | ORAL | 5 refills | Status: DC

## 2016-12-27 NOTE — Progress Notes (Signed)
   PRENATAL VISIT NOTE  Subjective:  Brianna Palmer is a 31 y.o. G5P1031 at 4417w5d being seen today for ongoing prenatal care.  She is currently monitored for the following issues for this low-risk pregnancy and has Depressive disorder, not elsewhere classified; Supervision of other normal pregnancy, antepartum; Herpes genitalis in women; Previous cesarean delivery affecting pregnancy; Migraine without aura and with status migrainosus, not intractable; Muscle spasm; and Headache in pregnancy on their problem list.  Patient reports backache, worse on right. Also has migraine headaches - has been seeing Nada MaclachlanKaren Teague-Clark, PA-C. Possibly had herpes outbreak, which is resolved now - had "bumps" break out on labia with itching, but resolved after 2 days.  Contractions: Not present. Vag. Bleeding: None.  Movement: Present. Denies leaking of fluid.   The following portions of the patient's history were reviewed and updated as appropriate: allergies, current medications, past family history, past medical history, past social history, past surgical history and problem list. Problem list updated.  Objective:   Vitals:   12/27/16 1054  BP: 120/69  Pulse: 92  Weight: 163 lb (73.9 kg)    Fetal Status: Fetal Heart Rate (bpm): 140   Movement: Present     General:  Alert, oriented and cooperative. Patient is in no acute distress.  Skin: Skin is warm and dry. No rash noted.   Cardiovascular: Normal heart rate noted  Respiratory: Normal respiratory effort, no problems with respiration noted  Abdomen: Soft, gravid, appropriate for gestational age. Pain/Pressure: Present     Pelvic:  Cervical exam deferred        MSK: Restriction, tenderness, tissue texture changes, and paraspinal spasm in the right lumbar spine, right upper thoracics, left cervical spine  Neuro: Moves all four extremities with no focal neurological deficit  Extremities: Normal range of motion.  Edema: None  Mental Status: Normal mood and  affect. Normal behavior. Normal judgment and thought content.   OSE: Head OA SRRL  Cervical C3 FSRR  Thoracic T6-7 FSRL, T3-4 FSRR  Rib Ribs 3-4 inhaled  Lumbar L5 ESRR  Sacrum R/R  Pelvis R ant innom    Assessment and Plan:  Pregnancy: G5P1031 at 2317w5d  1. Supervision of other normal pregnancy, antepartum FHT and FH normal  2. Herpes genitalis in women Valtrex sent to pharmacy if develops again.  3. Back pain affecting pregnancy in second trimester 4. Migraine without aura and with status migrainosus, not intractable 5. Somatic dysfunction of lumbar region 6. Somatic dysfunction of spine, thoracic 7. Somatic dysfunction of spine, cervical OMT done after patient permission. HVLA technique utilized. 7 areas treated with improvement of tissue texture and joint mobility. Patient tolerated procedure well.     Preterm labor symptoms and general obstetric precautions including but not limited to vaginal bleeding, contractions, leaking of fluid and fetal movement were reviewed in detail with the patient. Please refer to After Visit Summary for other counseling recommendations.  Return in about 2 weeks (around 01/10/2017) for OB f/u, 2 hr GTT.  Levie HeritageStinson, Jacob J, DO

## 2017-01-05 ENCOUNTER — Ambulatory Visit (INDEPENDENT_AMBULATORY_CARE_PROVIDER_SITE_OTHER): Payer: BLUE CROSS/BLUE SHIELD | Admitting: Family Medicine

## 2017-01-05 VITALS — BP 119/65 | HR 85 | Temp 97.5°F | Wt 167.0 lb

## 2017-01-05 DIAGNOSIS — J019 Acute sinusitis, unspecified: Secondary | ICD-10-CM

## 2017-01-05 DIAGNOSIS — Z3482 Encounter for supervision of other normal pregnancy, second trimester: Secondary | ICD-10-CM

## 2017-01-05 DIAGNOSIS — Z3480 Encounter for supervision of other normal pregnancy, unspecified trimester: Secondary | ICD-10-CM

## 2017-01-05 MED ORDER — OB COMPLETE PETITE 35-5-1-200 MG PO CAPS: 1.0000 | ORAL_CAPSULE | Freq: Every day | ORAL | 12 refills | Status: DC

## 2017-01-05 MED ORDER — PRENATE 0.6-0.4 MG PO CHEW: 1.0000 | CHEWABLE_TABLET | Freq: Every day | ORAL | 5 refills | Status: DC

## 2017-01-05 MED ORDER — AZITHROMYCIN 250 MG PO TABS: ORAL_TABLET | ORAL | 1 refills | Status: DC

## 2017-01-05 NOTE — Progress Notes (Signed)
Patient reports scratchy throat and drainage with coughing for several days. Patient given over the counter medications she could take but states that she would like a prescription medication called in. Brianna StammerJennifer Howard RN

## 2017-01-05 NOTE — Addendum Note (Signed)
Addended by: Anell BarrHOWARD, JENNIFER L on: 01/05/2017 03:11 PM   Modules accepted: Orders

## 2017-01-05 NOTE — Progress Notes (Signed)
   PRENATAL VISIT NOTE  Subjective:  Brianna Palmer is a 31 y.o. G5P1031 at 6768w0d being seen today for ongoing prenatal care.  She is currently monitored for the following issues for this low-risk pregnancy and has Depressive disorder, not elsewhere classified; Supervision of other normal pregnancy, antepartum; Herpes genitalis in women; Previous cesarean delivery affecting pregnancy; Migraine without aura and with status migrainosus, not intractable; Muscle spasm; and Headache in pregnancy on their problem list.  Patient reports sinus congestion and pain over past 3-4 days, worsening. Dry and burning throat..  Contractions: Not present. Vag. Bleeding: None.  Movement: Present. Denies leaking of fluid.   The following portions of the patient's history were reviewed and updated as appropriate: allergies, current medications, past family history, past medical history, past social history, past surgical history and problem list. Problem list updated.  Objective:   Vitals:   01/05/17 1417  BP: 119/65  Pulse: 85  Temp: (!) 97.5 F (36.4 C)  Weight: 167 lb (75.8 kg)    Fetal Status: Fetal Heart Rate (bpm): 145   Movement: Present     General:  Alert, oriented and cooperative. Patient is in no acute distress.  HEENT: Cobblestoning in posterior oropharynx. Sinus tenderness throughout.  Skin: Skin is warm and dry. No rash noted.   Cardiovascular: Normal heart rate noted  Respiratory: Normal respiratory effort, no problems with respiration noted  Abdomen: Soft, gravid, appropriate for gestational age.  Pain/Pressure: Present     Pelvic: Cervical exam deferred        Extremities: Normal range of motion.  Edema: None  Mental Status:  Normal mood and affect. Normal behavior. Normal judgment and thought content.   Assessment and Plan:  Pregnancy: G5P1031 at 6968w0d  1. Supervision of other normal pregnancy, antepartum - Prenat-FeCbn-FeAspGl-FA-Omega (OB COMPLETE PETITE) 35-5-1-200 MG CAPS; Take 1  tablet by mouth daily.  Dispense: 30 capsule; Refill: 12  2. Acute sinusitis, recurrence not specified, unspecified location Zithromax prescribed. Discussed symptomatic treatment with mucinex   Preterm labor symptoms and general obstetric precautions including but not limited to vaginal bleeding, contractions, leaking of fluid and fetal movement were reviewed in detail with the patient. Please refer to After Visit Summary for other counseling recommendations.  No Follow-up on file.   Levie HeritageJacob J Zelia Yzaguirre, DO

## 2017-01-06 ENCOUNTER — Encounter (HOSPITAL_COMMUNITY): Payer: Self-pay | Admitting: *Deleted

## 2017-01-06 ENCOUNTER — Inpatient Hospital Stay (HOSPITAL_COMMUNITY)
Admission: AD | Admit: 2017-01-06 | Discharge: 2017-01-06 | Disposition: A | Discharge status: Home / Self Care | Payer: BLUE CROSS/BLUE SHIELD | Source: Ambulatory Visit | Attending: Obstetrics & Gynecology | Admitting: Obstetrics & Gynecology

## 2017-01-06 DIAGNOSIS — Z3A24 24 weeks gestation of pregnancy: Secondary | ICD-10-CM | POA: Diagnosis not present

## 2017-01-06 DIAGNOSIS — O212 Late vomiting of pregnancy: Secondary | ICD-10-CM | POA: Insufficient documentation

## 2017-01-06 DIAGNOSIS — R51 Headache: Secondary | ICD-10-CM

## 2017-01-06 DIAGNOSIS — Z87891 Personal history of nicotine dependence: Secondary | ICD-10-CM | POA: Insufficient documentation

## 2017-01-06 DIAGNOSIS — A6009 Herpesviral infection of other urogenital tract: Secondary | ICD-10-CM

## 2017-01-06 DIAGNOSIS — O219 Vomiting of pregnancy, unspecified: Secondary | ICD-10-CM

## 2017-01-06 DIAGNOSIS — G43001 Migraine without aura, not intractable, with status migrainosus: Secondary | ICD-10-CM | POA: Diagnosis present

## 2017-01-06 DIAGNOSIS — R519 Headache, unspecified: Secondary | ICD-10-CM

## 2017-01-06 DIAGNOSIS — O26892 Other specified pregnancy related conditions, second trimester: Secondary | ICD-10-CM | POA: Insufficient documentation

## 2017-01-06 DIAGNOSIS — O34219 Maternal care for unspecified type scar from previous cesarean delivery: Secondary | ICD-10-CM

## 2017-01-06 LAB — INFLUENZA PANEL BY PCR (TYPE A & B)
INFLBPCR: NEGATIVE
Influenza A By PCR: NEGATIVE

## 2017-01-06 LAB — URINALYSIS, ROUTINE W REFLEX MICROSCOPIC
Bilirubin Urine: NEGATIVE
Glucose, UA: NEGATIVE mg/dL
Hgb urine dipstick: NEGATIVE
Ketones, ur: NEGATIVE mg/dL
LEUKOCYTES UA: NEGATIVE
NITRITE: NEGATIVE
PH: 8 (ref 5.0–8.0)
Protein, ur: NEGATIVE mg/dL
SPECIFIC GRAVITY, URINE: 1.005 (ref 1.005–1.030)

## 2017-01-06 MED ORDER — DIPHENHYDRAMINE HCL 50 MG/ML IJ SOLN
25.0000 mg | Freq: Once | INTRAMUSCULAR | Status: AC
  Administered 2017-01-06: 25 mg via INTRAVENOUS
  Filled 2017-01-06: qty 1

## 2017-01-06 MED ORDER — METOCLOPRAMIDE HCL 5 MG/ML IJ SOLN
10.0000 mg | Freq: Once | INTRAMUSCULAR | Status: AC
  Administered 2017-01-06: 10 mg via INTRAVENOUS
  Filled 2017-01-06: qty 2

## 2017-01-06 MED ORDER — DEXAMETHASONE SODIUM PHOSPHATE 10 MG/ML IJ SOLN
10.0000 mg | Freq: Once | INTRAMUSCULAR | Status: AC
  Administered 2017-01-06: 10 mg via INTRAVENOUS
  Filled 2017-01-06: qty 1

## 2017-01-06 MED ORDER — SODIUM CHLORIDE 0.9 % IV SOLN
INTRAVENOUS | Status: DC
  Administered 2017-01-06: 125 mL/h via INTRAVENOUS

## 2017-01-06 NOTE — MAU Note (Signed)
Pr reports headache, sinus pressure. Pt reports she was nauseated this am but did not vomit. States she has been having chills this am.

## 2017-01-06 NOTE — MAU Provider Note (Signed)
History     CSN: [id number]  Arrival date and time: 01/06/17 0941   First Provider Initiated Contact with Patient 01/06/17 1054      Chief Complaint  Patient presents with  . Headache  . Emesis   HPI  HPI: Brianna Palmer is a 31 y.o. year old 755P1031 female at 5855w1d weeks gestation who presents to MAU reporting H/A, sinus pressure and nausea; no vomiting. She reports having cold-like sx's. FOB has been sick recently and other child is sick. Reports good (+) FM. Denies VB or LOF.   Past Medical History:  Diagnosis Date  . Headache     Past Surgical History:  Procedure Laterality Date  . BUNIONECTOMY    . CESAREAN SECTION N/A 03/30/2012   Procedure: CESAREAN SECTION;  Surgeon: Antionette CharLisa Jackson-Moore, MD;  Location: [facility];  Service: Obstetrics;  Laterality: N/A;  Primary Cesarean Section Delivery Baby Boy @ 0157, Apgars 8/9    Family History  Adopted: Yes  Problem Relation Age of Onset  . Other Neg Hx   . Cancer Neg Hx   . Hypertension Neg Hx   . Stroke Neg Hx     Social History   Tobacco Use  . Smoking status: Former Smoker    Packs/day: 0.25    Types: Cigarettes    Last attempt to quit: 07/01/2013    Years since quitting: 3.5  . Smokeless tobacco: Never Used  Substance Use Topics  . Alcohol use: No    Alcohol/week: 0.0 oz    Comment: socially  . Drug use: No    Allergies: No Known Allergies  Medications Prior to Admission  Medication Sig Dispense Refill Last Dose  . acetaminophen (TYLENOL) 325 MG tablet Take 650 mg by mouth every 6 (six) hours as needed for mild pain or headache.   01/05/2017 at Unknown time  . azithromycin (ZITHROMAX) 250 MG tablet Take as directed: Two pills by mouth the first day, then one pill every day until completed 6 tablet 1 01/06/2017 at 0830  . cyclobenzaprine (FLEXERIL) 10 MG tablet Take 1 tablet (10 mg total) by mouth every 8 (eight) hours as needed for muscle spasms. 40 tablet 2 Past Week at Unknown time  . metoCLOPramide  (REGLAN) 10 MG tablet Take 1 tablet (10 mg total) by mouth 3 (three) times daily as needed for nausea. 30 tablet 2 prn  . Polyethyl Glycol-Propyl Glycol (SYSTANE) 0.4-0.3 % SOLN Place 1 drop into both eyes daily.   01/05/2017 at Unknown time  . Prenat-FeCbn-FeAspGl-FA-Omega (OB COMPLETE PETITE) 35-5-1-200 MG CAPS Take 1 tablet by mouth daily. 30 capsule 12 01/05/2017 at Unknown time  . Probiotic Product (PROBIOTIC PO) Take 1 capsule by mouth daily.   Past Month at Unknown time  . valACYclovir (VALTREX) 500 MG tablet Take two tablets by mouth twice daily for ten days; then one tablet twice daily for remainder of pregnancy 180 tablet 5     Review of Systems  Constitutional: Negative.   HENT: Negative.   Eyes: Negative.   Respiratory: Negative.   Cardiovascular: Negative.   Gastrointestinal: Positive for nausea.  Endocrine: Negative.   Genitourinary: Negative.   Musculoskeletal: Negative.   Skin: Negative.   Allergic/Immunologic: Negative.   Neurological: Positive for headaches.  Hematological: Negative.   Psychiatric/Behavioral: Negative.    Physical Exam   Blood pressure 124/69, pulse 95, temperature 98.2 F (36.8 C), temperature source Oral, resp. rate 18, height 5\' 3"  (1.6 m), weight 166 lb (75.3 kg), last menstrual period 07/21/2016, SpO2  99 %.  Physical Exam  Constitutional: She is oriented to person, place, and time. She appears well-developed and well-nourished.  HENT:  Head: Normocephalic.  Right Ear: Hearing normal.  Left Ear: Hearing normal.  Nose: Nose normal. Right sinus exhibits no frontal sinus tenderness. Left sinus exhibits no frontal sinus tenderness.  Eyes: Conjunctivae, EOM and lids are normal. Pupils are equal, round, and reactive to light.  Neck: Normal range of motion.  Respiratory: Effort normal and breath sounds normal.  GI: Soft. Bowel sounds are normal. There is no CVA tenderness.  Musculoskeletal: Normal range of motion.  Neurological: She is alert and  oriented to person, place, and time.  Skin: Skin is warm and dry.  Psychiatric: She has a normal mood and affect. Her behavior is normal. Judgment and thought content normal.    MAU Course  Procedures  MDM [facility] Flu Panel H/A Protocol -- improved H/A and nausea NST - FHR: 135 bpm / moderate variability / accels present / decels absent / TOCO: none  Results for orders placed or performed during the hospital encounter of 01/06/17 (from the past 24 hour(s))  Urinalysis, Routine w reflex microscopic     Status: Abnormal   Collection Time: 01/06/17  9:44 AM  Result Value Ref Range   Color, Urine STRAW (A) YELLOW   APPearance CLEAR CLEAR   Specific Gravity, Urine 1.005 1.005 - 1.030   pH 8.0 5.0 - 8.0   Glucose, UA NEGATIVE NEGATIVE mg/dL   Hgb urine dipstick NEGATIVE NEGATIVE   Bilirubin Urine NEGATIVE NEGATIVE   Ketones, ur NEGATIVE NEGATIVE mg/dL   Protein, ur NEGATIVE NEGATIVE mg/dL   Nitrite NEGATIVE NEGATIVE   Leukocytes, UA NEGATIVE NEGATIVE  Influenza panel by PCR (type A & B)     Status: None   Collection Time: 01/06/17 11:15 AM  Result Value Ref Range   Influenza A By PCR NEGATIVE NEGATIVE   Influenza B By PCR NEGATIVE NEGATIVE      Assessment and Plan  Pregnancy headache in second trimester - Continue as prescribed for H/A control - Information provided on general headache   Nausea and vomiting during pregnancy - Eat small frequent meals - Continue Reglan at home  Discharge home Keep scheduled appt on 01/10/17 Patient verbalized an understanding of the plan of care and agrees.   [name], MSN, CNM 01/06/2017, 11:09 AM

## 2017-01-06 NOTE — MAU Note (Signed)
Pt. Denies sudden gush of fluid, positive for fetal movement, denies vaginal bleeding. Headache pain 7/10, steady. After several minutes of attempting to maintain constant fetal tracing, pt. positioned on left side, FHR 140s ... mid right quad.

## 2017-01-06 NOTE — MAU Note (Signed)
PO test given (crackers and soda); pt. Tolerated well - no emesis.

## 2017-01-10 ENCOUNTER — Ambulatory Visit (INDEPENDENT_AMBULATORY_CARE_PROVIDER_SITE_OTHER): Payer: BLUE CROSS/BLUE SHIELD | Admitting: Family Medicine

## 2017-01-10 ENCOUNTER — Encounter: Payer: BLUE CROSS/BLUE SHIELD | Admitting: Family Medicine

## 2017-01-10 VITALS — BP 108/70 | HR 102 | Wt 165.0 lb

## 2017-01-10 DIAGNOSIS — M549 Dorsalgia, unspecified: Secondary | ICD-10-CM

## 2017-01-10 DIAGNOSIS — G43001 Migraine without aura, not intractable, with status migrainosus: Secondary | ICD-10-CM | POA: Diagnosis not present

## 2017-01-10 DIAGNOSIS — Z3482 Encounter for supervision of other normal pregnancy, second trimester: Secondary | ICD-10-CM

## 2017-01-10 DIAGNOSIS — M9901 Segmental and somatic dysfunction of cervical region: Secondary | ICD-10-CM

## 2017-01-10 DIAGNOSIS — M9902 Segmental and somatic dysfunction of thoracic region: Secondary | ICD-10-CM

## 2017-01-10 DIAGNOSIS — O99891 Other specified diseases and conditions complicating pregnancy: Secondary | ICD-10-CM

## 2017-01-10 DIAGNOSIS — Z3480 Encounter for supervision of other normal pregnancy, unspecified trimester: Secondary | ICD-10-CM

## 2017-01-10 DIAGNOSIS — O9989 Other specified diseases and conditions complicating pregnancy, childbirth and the puerperium: Secondary | ICD-10-CM

## 2017-01-10 MED ORDER — CYCLOBENZAPRINE HCL 10 MG PO TABS: 10.0000 mg | ORAL_TABLET | Freq: Three times a day (TID) | ORAL | 2 refills | Status: DC | PRN

## 2017-01-10 NOTE — Progress Notes (Signed)
   PRENATAL VISIT NOTE  Subjective:  Glenetta BorgRenee Fadely is a 31 y.o. [id number] at 4454w5d being seen today for ongoing prenatal care.  She is currently monitored for the following issues for this low-risk pregnancy and has Depressive disorder, not elsewhere classified; Supervision of other normal pregnancy, antepartum; Herpes genitalis in women; Previous cesarean delivery affecting pregnancy; Migraine without aura and with status migrainosus, not intractable; Muscle spasm; Headache in pregnancy; and Nausea and vomiting during pregnancy on their problem list.  Patient reports has noticed few migraines with manipulation. Right hip pain has started to increase..  Contractions: Not present. Vag. Bleeding: None.  Movement: Present. Denies leaking of fluid.   The following portions of the patient's history were reviewed and updated as appropriate: allergies, current medications, past family history, past medical history, past social history, past surgical history and problem list. Problem list updated.  Objective:   Vitals:   01/10/17 0838  BP: 108/70  Pulse: (!) 102  Weight: 165 lb (74.8 kg)    Fetal Status: Fetal Heart Rate (bpm): 150   Movement: Present     General:  Alert, oriented and cooperative. Patient is in no acute distress.  Skin: Skin is warm and dry. No rash noted.   Cardiovascular: Normal heart rate noted  Respiratory: Normal respiratory effort, no problems with respiration noted  Abdomen: Soft, gravid, appropriate for gestational age. Pain/Pressure: Present     Pelvic:  Cervical exam deferred        MSK: Restriction, tenderness, tissue texture changes, and paraspinal spasm in the right lumbar spine  Neuro: Moves all four extremities with no focal neurological deficit  Extremities: Normal range of motion.     Mental Status: Normal mood and affect. Normal behavior. Normal judgment and thought content.   OSE: Head OA SRRL  Cervical C3 FSRR  Thoracic T6-7 FSRL, T3-4 FSRR  Rib Ribs 3-4  inhaled  Lumbar L5 ESRR  Sacrum r/R  Pelvis r ant innom    Assessment and Plan:  Pregnancy: G5P1031 at 5654w5d  1. Supervision of other normal pregnancy, antepartum FHT and FH normal - Glucose Tolerance, 2 Hours w/1 Hour - CBC - HIV antibody - RPR  2. Migraine without aura and with status migrainosus, not intractable 3. Back pain affecting pregnancy in second trimester 4. Somatic dysfunction of spine, thoracic 5. Somatic dysfunction of spine, cervical OMT done after patient permission. HVLA technique utilized. 7 areas treated with improvement of tissue texture and joint mobility. Patient tolerated procedure well.     Preterm labor symptoms and general obstetric precautions including but not limited to vaginal bleeding, contractions, leaking of fluid and fetal movement were reviewed in detail with the patient. Please refer to After Visit Summary for other counseling recommendations.  Return in about 4 weeks (around 02/07/2017).  Levie HeritageStinson, Jacob J, DO

## 2017-01-11 ENCOUNTER — Encounter: Payer: Self-pay | Admitting: Family Medicine

## 2017-01-11 LAB — RPR: RPR: NONREACTIVE

## 2017-01-11 LAB — HIV ANTIBODY (ROUTINE TESTING W REFLEX): HIV SCREEN 4TH GENERATION: NONREACTIVE

## 2017-01-11 LAB — GLUCOSE TOLERANCE, 2 HOURS W/ 1HR
GLUCOSE, 1 HOUR: 79 mg/dL (ref 65–179)
GLUCOSE, 2 HOUR: 55 mg/dL — AB (ref 65–152)
GLUCOSE, FASTING: 68 mg/dL (ref 65–91)

## 2017-01-11 LAB — CBC
HEMOGLOBIN: 11.9 g/dL (ref 11.1–15.9)
Hematocrit: 34.8 % (ref 34.0–46.6)
MCH: 30.1 pg (ref 26.6–33.0)
MCHC: 34.2 g/dL (ref 31.5–35.7)
MCV: 88 fL (ref 79–97)
PLATELETS: 255 10*3/uL (ref 150–379)
RBC: 3.95 x10E6/uL (ref 3.77–5.28)
RDW: 14.8 % (ref 12.3–15.4)
WBC: 7.9 10*3/uL (ref 3.4–10.8)

## 2017-01-19 ENCOUNTER — Encounter: Payer: BLUE CROSS/BLUE SHIELD | Admitting: Obstetrics & Gynecology

## 2017-02-07 ENCOUNTER — Encounter: Payer: Self-pay | Admitting: Obstetrics & Gynecology

## 2017-02-07 ENCOUNTER — Ambulatory Visit (INDEPENDENT_AMBULATORY_CARE_PROVIDER_SITE_OTHER): Payer: BLUE CROSS/BLUE SHIELD | Admitting: Obstetrics & Gynecology

## 2017-02-07 VITALS — BP 109/72 | HR 100 | Wt 173.0 lb

## 2017-02-07 DIAGNOSIS — O219 Vomiting of pregnancy, unspecified: Secondary | ICD-10-CM

## 2017-02-07 DIAGNOSIS — R51 Headache: Secondary | ICD-10-CM

## 2017-02-07 DIAGNOSIS — Z3483 Encounter for supervision of other normal pregnancy, third trimester: Secondary | ICD-10-CM

## 2017-02-07 DIAGNOSIS — Z23 Encounter for immunization: Secondary | ICD-10-CM

## 2017-02-07 DIAGNOSIS — O26893 Other specified pregnancy related conditions, third trimester: Secondary | ICD-10-CM

## 2017-02-07 DIAGNOSIS — Z3480 Encounter for supervision of other normal pregnancy, unspecified trimester: Secondary | ICD-10-CM

## 2017-02-07 DIAGNOSIS — O34219 Maternal care for unspecified type scar from previous cesarean delivery: Secondary | ICD-10-CM

## 2017-02-07 DIAGNOSIS — A6009 Herpesviral infection of other urogenital tract: Secondary | ICD-10-CM

## 2017-02-07 NOTE — Patient Instructions (Signed)

## 2017-02-07 NOTE — Addendum Note (Signed)
Addended by: Anell BarrHOWARD, Gesselle Fitzsimons L on: 02/07/2017 09:37 AM   Modules accepted: Orders

## 2017-02-07 NOTE — Progress Notes (Signed)
   PRENATAL VISIT NOTE  Subjective:  Brianna Palmer is a 32 y.o. G5P1031 at 7438w5d being seen today for ongoing prenatal care.  She is currently monitored for the following issues for this low-risk pregnancy and has Depressive disorder, not elsewhere classified; Supervision of other normal pregnancy, antepartum; Herpes genitalis in women; Previous cesarean delivery affecting pregnancy; Migraine without aura and with status migrainosus, not intractable; Muscle spasm; Headache in pregnancy; and Nausea and vomiting during pregnancy on their problem list.  Patient reports feeling light headed- did not have breakfast this am. This resolved with crackers and juice. his .  Contractions: Not present. Vag. Bleeding: None.  Movement: Present. Denies leaking of fluid.   The following portions of the patient's history were reviewed and updated as appropriate: allergies, current medications, past family history, past medical history, past social history, past surgical history and problem list. Problem list updated.  Objective:   Vitals:   02/07/17 0907  BP: 109/72  Pulse: 100  Weight: 173 lb (78.5 kg)    Fetal Status:     Movement: Present     General:  Alert, oriented and cooperative. Patient is in no acute distress.  Skin: Skin is warm and dry. No rash noted.   Cardiovascular: Normal heart rate noted  Respiratory: Normal respiratory effort, no problems with respiration noted  Abdomen: Soft, gravid, appropriate for gestational age.  Pain/Pressure: Present     Pelvic: Cervical exam deferred        Extremities: Normal range of motion.  Edema: None  Mental Status:  Normal mood and affect. Normal behavior. Normal judgment and thought content.   Assessment and Plan:  Pregnancy: G5P1031 at 2538w5d  1. Supervision of other normal pregnancy, antepartum Tdap given today 2. Previous cesarean delivery affecting pregnancy VBAC consent signed today.   3. Herpes genitalis in women  4. Pregnancy headache in  third trimester  5. Nausea and vomiting during pregnancy  Preterm labor symptoms and general obstetric precautions including but not limited to vaginal bleeding, contractions, leaking of fluid and fetal movement were reviewed in detail with the patient. Please refer to After Visit Summary for other counseling recommendations.  Return in about 2 weeks (around 02/21/2017).   Willodean Rosenthalarolyn Harraway-Smith, MD

## 2017-02-13 ENCOUNTER — Other Ambulatory Visit: Payer: Self-pay

## 2017-02-13 ENCOUNTER — Inpatient Hospital Stay (HOSPITAL_COMMUNITY)
Admission: AD | Admit: 2017-02-13 | Discharge: 2017-02-14 | Disposition: A | Discharge status: Home / Self Care | Payer: BLUE CROSS/BLUE SHIELD | Source: Ambulatory Visit | Attending: Obstetrics & Gynecology | Admitting: Obstetrics & Gynecology

## 2017-02-13 ENCOUNTER — Encounter (HOSPITAL_COMMUNITY): Payer: Self-pay | Admitting: *Deleted

## 2017-02-13 DIAGNOSIS — Z3A29 29 weeks gestation of pregnancy: Secondary | ICD-10-CM | POA: Insufficient documentation

## 2017-02-13 DIAGNOSIS — O26893 Other specified pregnancy related conditions, third trimester: Secondary | ICD-10-CM

## 2017-02-13 DIAGNOSIS — O9A213 Injury, poisoning and certain other consequences of external causes complicating pregnancy, third trimester: Secondary | ICD-10-CM

## 2017-02-13 DIAGNOSIS — R42 Dizziness and giddiness: Secondary | ICD-10-CM

## 2017-02-13 DIAGNOSIS — Z87891 Personal history of nicotine dependence: Secondary | ICD-10-CM | POA: Diagnosis not present

## 2017-02-13 DIAGNOSIS — W109XXA Fall (on) (from) unspecified stairs and steps, initial encounter: Secondary | ICD-10-CM | POA: Insufficient documentation

## 2017-02-13 DIAGNOSIS — R109 Unspecified abdominal pain: Secondary | ICD-10-CM | POA: Diagnosis not present

## 2017-02-13 DIAGNOSIS — O99513 Diseases of the respiratory system complicating pregnancy, third trimester: Secondary | ICD-10-CM | POA: Diagnosis not present

## 2017-02-13 DIAGNOSIS — J069 Acute upper respiratory infection, unspecified: Secondary | ICD-10-CM | POA: Diagnosis not present

## 2017-02-13 DIAGNOSIS — R05 Cough: Secondary | ICD-10-CM | POA: Diagnosis present

## 2017-02-13 LAB — URINALYSIS, ROUTINE W REFLEX MICROSCOPIC
Bilirubin Urine: NEGATIVE
GLUCOSE, UA: NEGATIVE mg/dL
Hgb urine dipstick: NEGATIVE
Ketones, ur: NEGATIVE mg/dL
Leukocytes, UA: NEGATIVE
Nitrite: NEGATIVE
PH: 7 (ref 5.0–8.0)
Protein, ur: NEGATIVE mg/dL
SPECIFIC GRAVITY, URINE: 1.005 (ref 1.005–1.030)

## 2017-02-13 LAB — INFLUENZA PANEL BY PCR (TYPE A & B)
Influenza A By PCR: NEGATIVE
Influenza B By PCR: NEGATIVE

## 2017-02-13 MED ORDER — NIFEDIPINE 10 MG PO CAPS
10.0000 mg | ORAL_CAPSULE | ORAL | Status: AC | PRN
  Administered 2017-02-14 (×3): 10 mg via ORAL
  Filled 2017-02-13 (×3): qty 1

## 2017-02-13 MED ORDER — LACTATED RINGERS IV BOLUS (SEPSIS): 1000.0000 mL | Freq: Once | INTRAVENOUS | Status: DC

## 2017-02-13 MED ORDER — ACETAMINOPHEN 500 MG PO TABS
1000.0000 mg | ORAL_TABLET | Freq: Once | ORAL | Status: AC
  Administered 2017-02-14: 1000 mg via ORAL
  Filled 2017-02-13: qty 2

## 2017-02-13 NOTE — MAU Note (Signed)
Pt reports that she slipped going down the stairs and fell about 45 minutes ago. Pt reports reports headache that started 20 minutes along with some blurred vision. Pt took extra strength tylenol this morning for a headache which resolved. Pt also has a cough and sneezing for 2 days. Denies fever chills or sore throat.

## 2017-02-13 NOTE — MAU Provider Note (Signed)
Chief Complaint:  Fall and Headache   First Provider Initiated Contact with Patient 02/13/17 2248      HPI: Brianna Palmer is a 32 y.o. [id number] at 7443w4d who presents to maternity admissions reporting cough/runny nose, headache, dizziness, and a fall on her stairs at home today. She landed on her buttocks and denies any known injury.   She reports her cough/headache/runny nose started 2 days ago and have not worsened but are not improving since onset. Her son had a 5 day illness last week with fever and similar symptoms. She has not taken her temp at home but denies fever/chills. She does report some body aches today.  She became dizzy at home today and slipped while standing on her stairs at home.  She has not tried any medications for her respiratory symptoms, was not sure what was safe to take.  She reports her h/a has worsened over the course of today. Her last treatment for this was early this morning when Tylenol resolved the h/a.  There are no other associated symptoms. Upon arrival in MAU she denied cramping contractions but reports feeling more cramping within a few minutes of arrival.   She reports good fetal movement, denies LOF, vaginal bleeding, vaginal itching/burning, urinary symptoms, n/v, or fever/chills.    HPI  Past Medical History: Past Medical History:  Diagnosis Date  . Headache     Past obstetric history: OB History  Gravida Para Term Preterm AB Living  5 1 1   3 1   SAB TAB Ectopic Multiple Live Births  2 1     1     # Outcome Date GA Lbr Len/2nd Weight Sex Delivery Anes PTL Lv  5 Current           4 Term 03/30/12 2668w6d  6 lb 3.8 oz (2.83 kg) M CS-LVertical EPI  LIV  3 SAB 2012          2 SAB 2012 7042w0d         1 TAB 2007              Past Surgical History: Past Surgical History:  Procedure Laterality Date  . BUNIONECTOMY    . CESAREAN SECTION N/A 03/30/2012   Procedure: CESAREAN SECTION;  Surgeon: Antionette CharLisa Jackson-Moore, MD;  Location: [facility];  Service: Obstetrics;   Laterality: N/A;  Primary Cesarean Section Delivery Baby Boy @ 0157, Apgars 8/9    Family History: Family History  Adopted: Yes  Problem Relation Age of Onset  . Other Neg Hx   . Cancer Neg Hx   . Hypertension Neg Hx   . Stroke Neg Hx     Social History: Social History   Tobacco Use  . Smoking status: Former Smoker    Packs/day: 0.25    Types: Cigarettes    Last attempt to quit: 07/01/2013    Years since quitting: 3.6  . Smokeless tobacco: Never Used  Substance Use Topics  . Alcohol use: No    Alcohol/week: 0.0 oz    Comment: socially  . Drug use: No    Allergies: No Known Allergies  Meds:  Medications Prior to Admission  Medication Sig Dispense Refill Last Dose  . acetaminophen (TYLENOL) 325 MG tablet Take 650 mg by mouth every 6 (six) hours as needed for mild pain or headache.   01/05/2017 at Unknown time  . azithromycin (ZITHROMAX) 250 MG tablet Take as directed: Two pills by mouth the first day, then one pill every day until completed 6  tablet 1 01/06/2017 at 0830  . cyclobenzaprine (FLEXERIL) 10 MG tablet Take 1 tablet (10 mg total) by mouth every 8 (eight) hours as needed for muscle spasms. 40 tablet 2   . metoCLOPramide (REGLAN) 10 MG tablet Take 1 tablet (10 mg total) by mouth 3 (three) times daily as needed for nausea. 30 tablet 2 prn  . Polyethyl Glycol-Propyl Glycol (SYSTANE) 0.4-0.3 % SOLN Place 1 drop into both eyes daily.   01/05/2017 at Unknown time  . Prenat-FeCbn-FeAspGl-FA-Omega (OB COMPLETE PETITE) 35-5-1-200 MG CAPS Take 1 tablet by mouth daily. 30 capsule 12 01/05/2017 at Unknown time  . Probiotic Product (PROBIOTIC PO) Take 1 capsule by mouth daily.   Past Month at Unknown time  . valACYclovir (VALTREX) 500 MG tablet Take two tablets by mouth twice daily for ten days; then one tablet twice daily for remainder of pregnancy 180 tablet 5     ROS:  Review of Systems  Constitutional: Negative for chills, fatigue and fever.  HENT: Positive for  congestion, postnasal drip, rhinorrhea and sinus pain.   Eyes: Negative for visual disturbance.  Respiratory: Negative for shortness of breath.   Cardiovascular: Negative for chest pain.  Gastrointestinal: Positive for abdominal pain. Negative for nausea and vomiting.  Genitourinary: Positive for pelvic pain. Negative for difficulty urinating, dysuria, flank pain, vaginal bleeding, vaginal discharge and vaginal pain.  Neurological: Positive for dizziness and headaches.  Psychiatric/Behavioral: Negative.      I have reviewed patient's Past Medical Hx, Surgical Hx, Family Hx, Social Hx, medications and allergies.   Physical Exam   Patient Vitals for the past 24 hrs:  BP Temp Temp src Pulse Resp SpO2 Height Weight  02/13/17 2134 113/76 98.3 F (36.8 C) Oral (!) 109 18 100 % 5\' 3"  (1.6 m) 176 lb 0.6 oz (79.9 kg)   Constitutional: Well-developed, well-nourished female in no acute distress.  Cardiovascular: normal rate Respiratory: normal effort GI: Abd soft, non-tender, gravid appropriate for gestational age.  MS: Extremities nontender, no edema, normal ROM Neurologic: Alert and oriented x 4.  GU: Neg CVAT.  PELVIC EXAM: FFN collected by blind swab   Dilation: Closed Effacement (%): Thick Cervical Position: Posterior Exam by:: Keean Wilmeth leftwich kirby cnm  FHT:  Baseline 130 , moderate variability, accelerations present, no decelerations Contractions: palpable every 4-5 minutes, toco readjusted for improved tracing   Labs: Results for orders placed or performed during the hospital encounter of 02/13/17 (from the past 24 hour(s))  Urinalysis, Routine w reflex microscopic     Status: None   Collection Time: 02/13/17  9:45 PM  Result Value Ref Range   Color, Urine YELLOW YELLOW   APPearance CLEAR CLEAR   Specific Gravity, Urine 1.005 1.005 - 1.030   pH 7.0 5.0 - 8.0   Glucose, UA NEGATIVE NEGATIVE mg/dL   Hgb urine dipstick NEGATIVE NEGATIVE   Bilirubin Urine NEGATIVE NEGATIVE    Ketones, ur NEGATIVE NEGATIVE mg/dL   Protein, ur NEGATIVE NEGATIVE mg/dL   Nitrite NEGATIVE NEGATIVE   Leukocytes, UA NEGATIVE NEGATIVE  Influenza panel by PCR (type A & B)     Status: None   Collection Time: 02/13/17 11:01 PM  Result Value Ref Range   Influenza A By PCR NEGATIVE NEGATIVE   Influenza B By PCR NEGATIVE NEGATIVE  Fetal fibronectin     Status: None   Collection Time: 02/13/17 11:18 PM  Result Value Ref Range   Fetal Fibronectin NEGATIVE NEGATIVE  CBC     Status: Abnormal   Collection  Time: 02/13/17 11:50 PM  Result Value Ref Range   WBC 9.4 4.0 - 10.5 K/uL   RBC 4.12 3.87 - 5.11 MIL/uL   Hemoglobin 12.3 12.0 - 15.0 g/dL   HCT 09.8 (L) 11.9 - 14.7 %   MCV 84.2 78.0 - 100.0 fL   MCH 29.9 26.0 - 34.0 pg   MCHC 35.4 30.0 - 36.0 g/dL   RDW 82.9 56.2 - 13.0 %   Platelets 232 150 - 400 K/uL  Comprehensive metabolic panel     Status: Abnormal   Collection Time: 02/13/17 11:50 PM  Result Value Ref Range   Sodium 132 (L) 135 - 145 mmol/L   Potassium 3.6 3.5 - 5.1 mmol/L   Chloride 101 101 - 111 mmol/L   CO2 19 (L) 22 - 32 mmol/L   Glucose, Bld 84 65 - 99 mg/dL   BUN 7 6 - 20 mg/dL   Creatinine, Ser 8.65 0.44 - 1.00 mg/dL   Calcium 8.4 (L) 8.9 - 10.3 mg/dL   Total Protein 6.8 6.5 - 8.1 g/dL   Albumin 2.9 (L) 3.5 - 5.0 g/dL   AST 27 15 - 41 U/L   ALT 18 14 - 54 U/L   Alkaline Phosphatase 62 38 - 126 U/L   Total Bilirubin 0.6 0.3 - 1.2 mg/dL   GFR calc non Af Amer >60 >60 mL/min   GFR calc Af Amer >60 >60 mL/min   Anion gap 12 5 - 15   B/Positive/-- (09/06 0933)  Imaging:  No results found.  MAU Course/MDM: CBC, CMP, flu swab, FFN NST reviewed and reactive x 4 hours LR x 1000 ml, Tylenol 1000 mg PO FFN negative, pt h/a improved with Tylenol Flu swab negative Procardia 10 mg x 3 doses Q 20 minutes Pt with significant improvement after medication, reports no more contractions Toco with some irritability, mild to palpation Cervix unchanged on recheck  No  evidence of preterm labor today with unchanged cervix and negative FFN.  Pt with negative flu swab today.  Likely URI causing dizziness which led to fall.  Safe OTC medications list given.  Pt to increase PO fluids, rest, keep scheduled appointments.  Return to MAU with signs of labor or emergencies.  Consult Dr Debroah Loop with presentation, exam findings and test results. Reviewed FHR tracing and toco with Dr Debroah Loop prior to discharge.  Pt discharge with strict preterm labor/infection precautions.  Today's evaluation included a work-up for preterm labor which can be life-threatening for both mom and baby.  Assessment: 1. Viral upper respiratory tract infection   2. Traumatic injury during pregnancy in third trimester   3. Abdominal pain during pregnancy in third trimester   4. Dizziness     Plan: Discharge home Labor precautions and fetal kick counts Follow-up Information    [facility] Follow up.   Specialty:  Obstetrics and Gynecology Why:  As scheduled, return to MAU as needed for emergencies Contact information: [address] [place] [address] [phone]         Allergies as of 02/14/2017   No Known Allergies     Medication List    TAKE these medications   acetaminophen 325 MG tablet Commonly known as:  TYLENOL Take 650 mg by mouth every 6 (six) hours as needed for mild pain or headache.   azithromycin 250 MG tablet Commonly known as:  ZITHROMAX Take as directed: Two pills by mouth the first day, then one  pill every day until completed   cyclobenzaprine 10 MG tablet Commonly known as:  FLEXERIL Take 1 tablet (10 mg total) by mouth every 8 (eight) hours as needed for muscle spasms.   metoCLOPramide 10 MG tablet Commonly known as:  REGLAN Take 1 tablet (10 mg total) by mouth 3 (three) times daily as needed for nausea.   OB COMPLETE PETITE 35-5-1-200 MG Caps Take 1 tablet by mouth  daily.   PROBIOTIC PO Take 1 capsule by mouth daily.   SYSTANE 0.4-0.3 % Soln Generic drug:  Polyethyl Glycol-Propyl Glycol Place 1 drop into both eyes daily.   valACYclovir 500 MG tablet Commonly known as:  VALTREX Take two tablets by mouth twice daily for ten days; then one tablet twice daily for remainder of pregnancy       Sharen Counter Certified Nurse-Midwife 02/14/2017 2:47 AM

## 2017-02-14 ENCOUNTER — Inpatient Hospital Stay (HOSPITAL_COMMUNITY): Payer: BLUE CROSS/BLUE SHIELD

## 2017-02-14 DIAGNOSIS — R109 Unspecified abdominal pain: Secondary | ICD-10-CM | POA: Diagnosis not present

## 2017-02-14 DIAGNOSIS — O26893 Other specified pregnancy related conditions, third trimester: Secondary | ICD-10-CM

## 2017-02-14 DIAGNOSIS — O99513 Diseases of the respiratory system complicating pregnancy, third trimester: Secondary | ICD-10-CM | POA: Diagnosis not present

## 2017-02-14 LAB — COMPREHENSIVE METABOLIC PANEL
ALK PHOS: 62 U/L (ref 38–126)
ALT: 18 U/L (ref 14–54)
AST: 27 U/L (ref 15–41)
Albumin: 2.9 g/dL — ABNORMAL LOW (ref 3.5–5.0)
Anion gap: 12 (ref 5–15)
BUN: 7 mg/dL (ref 6–20)
CALCIUM: 8.4 mg/dL — AB (ref 8.9–10.3)
CHLORIDE: 101 mmol/L (ref 101–111)
CO2: 19 mmol/L — ABNORMAL LOW (ref 22–32)
CREATININE: 0.57 mg/dL (ref 0.44–1.00)
GFR calc Af Amer: >60 mL/min (ref >60)
Glucose, Bld: 84 mg/dL (ref 65–99)
Potassium: 3.6 mmol/L (ref 3.5–5.1)
Sodium: 132 mmol/L — ABNORMAL LOW (ref 135–145)
Total Bilirubin: 0.6 mg/dL (ref 0.3–1.2)
Total Protein: 6.8 g/dL (ref 6.5–8.1)

## 2017-02-14 LAB — CBC
HEMATOCRIT: 34.7 % — AB (ref 36.0–46.0)
Hemoglobin: 12.3 g/dL (ref 12.0–15.0)
MCH: 29.9 pg (ref 26.0–34.0)
MCHC: 35.4 g/dL (ref 30.0–36.0)
MCV: 84.2 fL (ref 78.0–100.0)
PLATELETS: 232 10*3/uL (ref 150–400)
RBC: 4.12 MIL/uL (ref 3.87–5.11)
RDW: 13.3 % (ref 11.5–15.5)
WBC: 9.4 10*3/uL (ref 4.0–10.5)

## 2017-02-14 LAB — FETAL FIBRONECTIN: FETAL FIBRONECTIN: NEGATIVE

## 2017-02-16 ENCOUNTER — Telehealth: Payer: Self-pay

## 2017-02-16 NOTE — Telephone Encounter (Signed)
Patient called stating that she is having swelling in her feet to her ankles and that her neck skin is "turning blacker". Patient is [redacted] weeks gestation and baby moving well. No bleeding.  Suggested patient elevated feet as much as possible, increase water intake, and try some support stocking/hose. Patient states that she is drinking so much water but still feels like she has dry mouth and is still thirsty. Made patient aware that she can go to MAU if she feels she is dehydrated. Also suggested running humidifier in home as in winter months our mucous membranes tend to be more dry.   Patient states understanding and states [facility]. Patient then states concern again about swelling because it didn't start this early with her other pregnancy. Instructed patient to return to MAU for evaluation if she is concerned about dehydration, as I do not have a provider in the office now. Armandina StammerJennifer Howard RNBSN

## 2017-02-21 ENCOUNTER — Ambulatory Visit (INDEPENDENT_AMBULATORY_CARE_PROVIDER_SITE_OTHER): Payer: BLUE CROSS/BLUE SHIELD | Admitting: Family Medicine

## 2017-02-21 VITALS — BP 119/74 | HR 105 | Wt 175.0 lb

## 2017-02-21 DIAGNOSIS — O9989 Other specified diseases and conditions complicating pregnancy, childbirth and the puerperium: Secondary | ICD-10-CM

## 2017-02-21 DIAGNOSIS — L304 Erythema intertrigo: Secondary | ICD-10-CM

## 2017-02-21 DIAGNOSIS — Z3480 Encounter for supervision of other normal pregnancy, unspecified trimester: Secondary | ICD-10-CM

## 2017-02-21 DIAGNOSIS — O34219 Maternal care for unspecified type scar from previous cesarean delivery: Secondary | ICD-10-CM

## 2017-02-21 DIAGNOSIS — Z3483 Encounter for supervision of other normal pregnancy, third trimester: Secondary | ICD-10-CM

## 2017-02-21 DIAGNOSIS — M549 Dorsalgia, unspecified: Secondary | ICD-10-CM

## 2017-02-21 DIAGNOSIS — A6009 Herpesviral infection of other urogenital tract: Secondary | ICD-10-CM

## 2017-02-21 MED ORDER — CLOTRIMAZOLE-BETAMETHASONE 1-0.05 % EX CREA: 1.0000 "application " | TOPICAL_CREAM | Freq: Two times a day (BID) | CUTANEOUS | 1 refills | Status: DC

## 2017-02-21 NOTE — Addendum Note (Signed)
Addended by: Levie HeritageSTINSON, Brunella Wileman J on: 02/21/2017 11:05 AM   Modules accepted: Orders

## 2017-02-21 NOTE — Progress Notes (Addendum)
   PRENATAL VISIT NOTE  Subjective:  Brianna Palmer is a 32 y.o. [id number] at 6127w5d being seen today for ongoing prenatal care.  She is currently monitored for the following issues for this low-risk pregnancy and has Depressive disorder, not elsewhere classified; Supervision of other normal pregnancy, antepartum; Herpes genitalis in women; Previous cesarean delivery affecting pregnancy; Migraine without aura and with status migrainosus, not intractable; Muscle spasm; Headache in pregnancy; and Nausea and vomiting during pregnancy on their problem list.  Patient reports occasional contractions.  Contractions: Not present. Vag. Bleeding: None.  Movement: Present. Denies leaking of fluid. Having irritation of skin in inguinal crease.   The following portions of the patient's history were reviewed and updated as appropriate: allergies, current medications, past family history, past medical history, past social history, past surgical history and problem list. Problem list updated.  Objective:   Vitals:   02/21/17 1015  BP: 119/74  Pulse: (!) 105  Weight: 175 lb (79.4 kg)    Fetal Status: Fetal Heart Rate (bpm): 154   Movement: Present     General:  Alert, oriented and cooperative. Patient is in no acute distress.  Skin: Skin is warm and dry. No rash noted.   Cardiovascular: Normal heart rate noted  Respiratory: Normal respiratory effort, no problems with respiration noted  Abdomen: Soft, gravid, appropriate for gestational age.  Pain/Pressure: Present     Pelvic: Cervical exam deferred        Extremities: Normal range of motion.  Edema: None  Mental Status:  Normal mood and affect. Normal behavior. Normal judgment and thought content.   Assessment and Plan:  Pregnancy: G5P1031 at 8127w5d  1. Supervision of other normal pregnancy, antepartum FHT and FH normal. Reviewed MAU note from last week - had fall.  2. Previous cesarean delivery affecting pregnancy Desires TOLAC  3. Herpes genitalis  in women Prophylaxis at 36 weeks  4. Back pain affecting pregnancy in second trimester Improved  5. Intertrigo Has eczema, some concern for topical yeast - will prescribe clotrimazole plus betamethazone to help.  Preterm labor symptoms and general obstetric precautions including but not limited to vaginal bleeding, contractions, leaking of fluid and fetal movement were reviewed in detail with the patient. Please refer to After Visit Summary for other counseling recommendations.  Return in about 2 weeks (around 03/07/2017) for OB f/u.   Levie HeritageJacob J Maxwell Lemen, DO

## 2017-03-07 ENCOUNTER — Ambulatory Visit (INDEPENDENT_AMBULATORY_CARE_PROVIDER_SITE_OTHER): Payer: BLUE CROSS/BLUE SHIELD | Admitting: Family Medicine

## 2017-03-07 VITALS — BP 112/63 | HR 98 | Wt 175.0 lb

## 2017-03-07 DIAGNOSIS — A6009 Herpesviral infection of other urogenital tract: Secondary | ICD-10-CM

## 2017-03-07 DIAGNOSIS — Z3480 Encounter for supervision of other normal pregnancy, unspecified trimester: Secondary | ICD-10-CM

## 2017-03-07 DIAGNOSIS — Z3483 Encounter for supervision of other normal pregnancy, third trimester: Secondary | ICD-10-CM

## 2017-03-07 DIAGNOSIS — L304 Erythema intertrigo: Secondary | ICD-10-CM

## 2017-03-07 DIAGNOSIS — O34219 Maternal care for unspecified type scar from previous cesarean delivery: Secondary | ICD-10-CM

## 2017-03-07 DIAGNOSIS — O479 False labor, unspecified: Secondary | ICD-10-CM

## 2017-03-07 DIAGNOSIS — O4703 False labor before 37 completed weeks of gestation, third trimester: Secondary | ICD-10-CM

## 2017-03-07 DIAGNOSIS — O4700 False labor before 37 completed weeks of gestation, unspecified trimester: Secondary | ICD-10-CM

## 2017-03-07 MED ORDER — NIFEDIPINE ER OSMOTIC RELEASE 30 MG PO TB24: 30.0000 mg | ORAL_TABLET | Freq: Every day | ORAL | 2 refills | Status: DC

## 2017-03-07 NOTE — Progress Notes (Signed)
   PRENATAL VISIT NOTE  Subjective:  Brianna Palmer is a 32 y.o. [id number] at 3545w5d being seen today for ongoing prenatal care.  She is currently monitored for the following issues for this low-risk pregnancy and has Depressive disorder, not elsewhere classified; Supervision of other normal pregnancy, antepartum; Herpes genitalis in women; Previous cesarean delivery affecting pregnancy; Migraine without aura and with status migrainosus, not intractable; Muscle spasm; Headache in pregnancy; and Nausea and vomiting during pregnancy on their problem list.  Patient reports contractions. Was seen in MAU 3 weeks ago, has continued to have 4-5 contractions with pain every hour. Not improving. .  Contractions: Irregular. Vag. Bleeding: None.  Movement: Present. Denies leaking of fluid.   The following portions of the patient's history were reviewed and updated as appropriate: allergies, current medications, past family history, past medical history, past social history, past surgical history and problem list. Problem list updated.  Objective:   Vitals:   03/07/17 1003  BP: 112/63  Pulse: 98  Weight: 175 lb (79.4 kg)    Fetal Status: Fetal Heart Rate (bpm): 125 Fundal Height: 33 cm Movement: Present  Presentation: Vertex  General:  Alert, oriented and cooperative. Patient is in no acute distress.  Skin: Skin is warm and dry. No rash noted.   Cardiovascular: Normal heart rate noted  Respiratory: Normal respiratory effort, no problems with respiration noted  Abdomen: Soft, gravid, appropriate for gestational age.  Pain/Pressure: Present     Pelvic: Cervical exam performed Dilation: Closed Effacement (%): Thick Station: -3  Extremities: Normal range of motion.  Edema: Trace  Mental Status:  Normal mood and affect. Normal behavior. Normal judgment and thought content.   Assessment and Plan:  Pregnancy: G5P1031 at 6345w5d  1. Supervision of other normal pregnancy, antepartum Fht and FH normal. FHT  lower than previous. Prolonged doppler - good variability, no decel. FHR 135.   2. Previous cesarean delivery affecting pregnancy Desires TOLAC  3. Herpes genitalis in women On prophylaxis  4. Intertrigo Improved  5. Preterm uterine contractions No cervical dilation - low risk of preterm labor. Will give procardia for symptomatic relief. Discussed potential hypotension and orthostasis.  Preterm labor symptoms and general obstetric precautions including but not limited to vaginal bleeding, contractions, leaking of fluid and fetal movement were reviewed in detail with the patient. Please refer to After Visit Summary for other counseling recommendations.  No Follow-up on file.   Levie HeritageJacob J Merida Alcantar, DO

## 2017-03-22 ENCOUNTER — Encounter: Payer: Self-pay | Admitting: Advanced Practice Midwife

## 2017-03-22 ENCOUNTER — Ambulatory Visit (INDEPENDENT_AMBULATORY_CARE_PROVIDER_SITE_OTHER): Payer: BLUE CROSS/BLUE SHIELD | Admitting: Advanced Practice Midwife

## 2017-03-22 VITALS — BP 123/71 | HR 96 | Wt 180.0 lb

## 2017-03-22 DIAGNOSIS — Z3480 Encounter for supervision of other normal pregnancy, unspecified trimester: Secondary | ICD-10-CM

## 2017-03-22 NOTE — Progress Notes (Signed)
   PRENATAL VISIT NOTE  Subjective:  Brianna Palmer is a 32 y.o. [id number] at 4851w6d being seen today for ongoing prenatal care.  She is currently monitored for the following issues for this low-risk pregnancy and has Depressive disorder, not elsewhere classified; Supervision of other normal pregnancy, antepartum; Herpes genitalis in women; Previous cesarean delivery affecting pregnancy; Migraine without aura and with status migrainosus, not intractable; Muscle spasm; Headache in pregnancy; and Nausea and vomiting during pregnancy on their problem list.  Patient reports occasional contractions.  Contractions: Irregular. Vag. Bleeding: None.  Movement: Present. Denies leaking of fluid.   The following portions of the patient's history were reviewed and updated as appropriate: allergies, current medications, past family history, past medical history, past social history, past surgical history and problem list. Problem list updated.  Objective:   Vitals:   03/22/17 1010  BP: 123/71  Pulse: 96  Weight: 180 lb (81.6 kg)    Fetal Status: Fetal Heart Rate (bpm): 140   Movement: Present     General:  Alert, oriented and cooperative. Patient is in no acute distress.  Skin: Skin is warm and dry. No rash noted.   Cardiovascular: Normal heart rate noted  Respiratory: Normal respiratory effort, no problems with respiration noted  Abdomen: Soft, gravid, appropriate for gestational age.  Pain/Pressure: Present     Pelvic: Cervical exam deferred        Extremities: Normal range of motion.  Edema: Trace  Mental Status:  Normal mood and affect. Normal behavior. Normal judgment and thought content.   Assessment and Plan:  Pregnancy: G5P1031 at 5951w6d  1. Supervision of other normal pregnancy, antepartum      Reviewed PTL precautions until 36 wks        Preterm labor symptoms and general obstetric precautions including but not limited to vaginal bleeding, contractions, leaking of fluid and fetal movement  were reviewed in detail with the patient. Please refer to After Visit Summary for other counseling recommendations.  Return in about 2 weeks (around 04/05/2017) [facility].   Wynelle BourgeoisMarie Mercades Bajaj, CNM

## 2017-03-22 NOTE — Patient Instructions (Signed)

## 2017-03-25 ENCOUNTER — Inpatient Hospital Stay (HOSPITAL_COMMUNITY)
Admission: AD | Admit: 2017-03-25 | Discharge: 2017-03-25 | Disposition: A | Discharge status: Home / Self Care | Payer: BLUE CROSS/BLUE SHIELD | Source: Ambulatory Visit | Attending: Obstetrics & Gynecology | Admitting: Obstetrics & Gynecology

## 2017-03-25 DIAGNOSIS — O98813 Other maternal infectious and parasitic diseases complicating pregnancy, third trimester: Secondary | ICD-10-CM | POA: Diagnosis not present

## 2017-03-25 DIAGNOSIS — O212 Late vomiting of pregnancy: Secondary | ICD-10-CM | POA: Insufficient documentation

## 2017-03-25 DIAGNOSIS — B373 Candidiasis of vulva and vagina: Secondary | ICD-10-CM | POA: Insufficient documentation

## 2017-03-25 DIAGNOSIS — O98313 Other infections with a predominantly sexual mode of transmission complicating pregnancy, third trimester: Secondary | ICD-10-CM | POA: Diagnosis not present

## 2017-03-25 DIAGNOSIS — O4703 False labor before 37 completed weeks of gestation, third trimester: Secondary | ICD-10-CM | POA: Diagnosis not present

## 2017-03-25 DIAGNOSIS — A6000 Herpesviral infection of urogenital system, unspecified: Secondary | ICD-10-CM | POA: Insufficient documentation

## 2017-03-25 DIAGNOSIS — O34219 Maternal care for unspecified type scar from previous cesarean delivery: Secondary | ICD-10-CM

## 2017-03-25 DIAGNOSIS — Z87891 Personal history of nicotine dependence: Secondary | ICD-10-CM | POA: Insufficient documentation

## 2017-03-25 DIAGNOSIS — B3731 Acute candidiasis of vulva and vagina: Secondary | ICD-10-CM

## 2017-03-25 DIAGNOSIS — Z3A35 35 weeks gestation of pregnancy: Secondary | ICD-10-CM | POA: Insufficient documentation

## 2017-03-25 DIAGNOSIS — R109 Unspecified abdominal pain: Secondary | ICD-10-CM | POA: Diagnosis present

## 2017-03-25 DIAGNOSIS — O219 Vomiting of pregnancy, unspecified: Secondary | ICD-10-CM

## 2017-03-25 DIAGNOSIS — A6009 Herpesviral infection of other urogenital tract: Secondary | ICD-10-CM

## 2017-03-25 DIAGNOSIS — M549 Dorsalgia, unspecified: Secondary | ICD-10-CM | POA: Diagnosis present

## 2017-03-25 LAB — WET PREP, GENITAL
Clue Cells Wet Prep HPF POC: NONE SEEN
Sperm: NONE SEEN
Trich, Wet Prep: NONE SEEN
Yeast Wet Prep HPF POC: NONE SEEN

## 2017-03-25 LAB — URINALYSIS, ROUTINE W REFLEX MICROSCOPIC
Bilirubin Urine: NEGATIVE
GLUCOSE, UA: NEGATIVE mg/dL
Hgb urine dipstick: NEGATIVE
KETONES UR: NEGATIVE mg/dL
LEUKOCYTES UA: NEGATIVE
Nitrite: NEGATIVE
PH: 6 (ref 5.0–8.0)
PROTEIN: NEGATIVE mg/dL
Specific Gravity, Urine: 1.006 (ref 1.005–1.030)

## 2017-03-25 MED ORDER — TERCONAZOLE 0.8 % VA CREA: 1.0000 | TOPICAL_CREAM | Freq: Every day | VAGINAL | 0 refills | Status: DC

## 2017-03-25 MED ORDER — NIFEDIPINE 10 MG PO CAPS
10.0000 mg | ORAL_CAPSULE | ORAL | Status: DC | PRN
  Administered 2017-03-25: 10 mg via ORAL
  Filled 2017-03-25: qty 1

## 2017-03-25 NOTE — MAU Note (Signed)
Pt reports she is having a lot of cramping in her lower abd x 2 days, lower back pain and ? Yeast infection

## 2017-03-25 NOTE — Progress Notes (Signed)
Patient has Procardia at home has not taken it today.

## 2017-03-25 NOTE — MAU Provider Note (Signed)
Chief Complaint:  Abdominal Pain; Back Pain; and Vaginal Discharge   First Provider Initiated Contact with Patient 03/25/17 1630      HPI: Brianna Palmer is a 32 y.o. [id number] at 4535w2dwho presents to maternity admissions reporting cramping, pelvic pain and vaginal discharge. She reports abdominal cramping for the past two days, she denies the cramping being painful or having to breath through. She rates pain a 5/10. She is seen for prenatal care at [facility]. Has been seen in MAU and the office for the same complaint and started on procardia. She reports not taking her procardia for the past few days she has been contracting.  She reports lower back pain and pelvic pain that has been constant in this third trimester, she reports "feeling like her hips are breaking apart because baby is right there". She denies wearing a maternity support belt. She describes the pain as aching- rates pain 4/10, has not taken any medication for pain since the pain began.  She reports increased vaginal discharge that started last week. She describes the discharge as "cottage cheese", denies discharge having an odor or using OTC medication to treat. She reports vaginal irritation and itching are associated with the discharge.  She reports good fetal movement, denies LOF, vaginal bleeding, urinary symptoms, h/a, dizziness, n/v, or fever/chills.     Past Medical History: Past Medical History:  Diagnosis Date  . Headache     Past obstetric history: OB History  Gravida Para Term Preterm AB Living  5 1 1   3 1   SAB TAB Ectopic Multiple Live Births  2 1     1     # Outcome Date GA Lbr Len/2nd Weight Sex Delivery Anes PTL Lv  5 Current           4 Term 03/30/12 6626w6d  6 lb 3.8 oz (2.83 kg) M CS-LVertical EPI  LIV  3 SAB 2012          2 SAB 2012 2899w0d         1 TAB 2007              Past Surgical History: Past Surgical History:  Procedure Laterality Date  . BUNIONECTOMY    . CESAREAN SECTION N/A 03/30/2012   Procedure: CESAREAN SECTION;  Surgeon: Antionette CharLisa Jackson-Moore, MD;  Location: [facility];  Service: Obstetrics;  Laterality: N/A;  Primary Cesarean Section Delivery Baby Boy @ 0157, Apgars 8/9    Family History: Family History  Adopted: Yes  Problem Relation Age of Onset  . Other Neg Hx   . Cancer Neg Hx   . Hypertension Neg Hx   . Stroke Neg Hx     Social History: Social History   Tobacco Use  . Smoking status: Former Smoker    Packs/day: 0.25    Types: Cigarettes    Last attempt to quit: 07/01/2013    Years since quitting: 3.7  . Smokeless tobacco: Never Used  Substance Use Topics  . Alcohol use: No    Alcohol/week: 0.0 oz    Comment: socially  . Drug use: No    Allergies: No Known Allergies  Meds:  No medications prior to admission.    ROS:  Review of Systems  Constitutional: Negative.   Respiratory: Negative.   Cardiovascular: Negative.   Gastrointestinal: Positive for abdominal pain. Negative for constipation, diarrhea, nausea and vomiting.  Genitourinary: Positive for pelvic pain and vaginal discharge. Negative for difficulty urinating, dysuria, frequency, urgency and vaginal bleeding.  Vaginal itch and irritation  Musculoskeletal: Positive for back pain.  Neurological: Negative.   Psychiatric/Behavioral: Negative.    I have reviewed patient's Past Medical Hx, Surgical Hx, Family Hx, Social Hx, medications and allergies.   Physical Exam   Patient Vitals for the past 24 hrs:  BP Temp Temp src Pulse Resp SpO2 Height Weight  03/25/17 1612 118/69 98.1 F (36.7 C) Oral 76 17 100 % 5\' 3"  (1.6 m) 180 lb (81.6 kg)   Constitutional: Well-developed, well-nourished female in no acute distress.  Cardiovascular: normal rate Respiratory: normal effort GI: Abd soft, non-tender, gravid appropriate for gestational age.  MS: Extremities nontender, no edema, normal ROM Neurologic: Alert and oriented x 4.  GU: Neg CVAT.  PELVIC EXAM:  Moderate white curdy discharge with  no odor, vaginal walls and external genitalia normal  CERVICAL EXAM: Dilation: Closed Effacement (%): Thick Exam by:: Steward Drone CNM  FHT:  Baseline 120 , moderate variability, accelerations present, no decelerations Contractions: UI with irregular mild contractions   Labs: Results for orders placed or performed during the hospital encounter of 03/25/17 (from the past 24 hour(s))  Urinalysis, Routine w reflex microscopic     Status: Abnormal   Collection Time: 03/25/17  4:10 PM  Result Value Ref Range   Color, Urine STRAW (A) YELLOW   APPearance CLEAR CLEAR   Specific Gravity, Urine 1.006 1.005 - 1.030   pH 6.0 5.0 - 8.0   Glucose, UA NEGATIVE NEGATIVE mg/dL   Hgb urine dipstick NEGATIVE NEGATIVE   Bilirubin Urine NEGATIVE NEGATIVE   Ketones, ur NEGATIVE NEGATIVE mg/dL   Protein, ur NEGATIVE NEGATIVE mg/dL   Nitrite NEGATIVE NEGATIVE   Leukocytes, UA NEGATIVE NEGATIVE  Wet prep, genital     Status: Abnormal   Collection Time: 03/25/17  4:35 PM  Result Value Ref Range   Yeast Wet Prep HPF POC NONE SEEN NONE SEEN   Trich, Wet Prep NONE SEEN NONE SEEN   Clue Cells Wet Prep HPF POC NONE SEEN NONE SEEN   WBC, Wet Prep HPF POC FEW (A) NONE SEEN   Sperm NONE SEEN    B/Positive/-- (09/06 0933)   MAU Course/MDM: Orders Placed This Encounter  Procedures  . Wet prep, genital  . Urinalysis, Routine w reflex microscopic  . Discharge patient Discharge disposition: 01-Home or Self Care; Discharge patient date: 03/25/2017  Wet prep- normal, will treat for yeast based on clinical symptoms and examination of discharge UA- negative   Meds ordered this encounter  Medications  . NIFEdipine (PROCARDIA) capsule 10 mg  . terconazole (TERAZOL 3) 0.8 % vaginal cream    Sig: Place 1 applicator vaginally at bedtime.    Dispense:  20 g    Refill:  0    Order Specific Question:   Supervising Provider    Answer:   [name] [[id number]]   NST reviewed-reactive  Treatments in MAU  included procardia 10mg  PO- patient reports decreased cramping after 1 dose. Educated on taking procardia as prescribed being daily and increase to 2 doses if contractions continue. Discussed use of maternity support belt from [employer] to help decrease hip and pelvic pressure. Patient verbalizes understanding.   Pt discharge with strict PTL precautions.  Today's evaluation included a work-up for preterm labor which can be life-threatening for both mom and baby.  Assessment: 1. Vaginal yeast infection   2. Nausea and vomiting during pregnancy   3. Herpes genitalis in women   4. Previous cesarean delivery affecting pregnancy  5. Preterm uterine contractions in third trimester, antepartum     Plan: Discharge home. Pt stable at time of discharge Preterm Labor precautions and fetal kick counts Follow up as scheduled in office for prenatal appointments  Return to MAU as needed for emergencies or worsening symptoms   Follow-up Information    [facility] Follow up.   Specialty:  Obstetrics and Gynecology Why:  Follow up as scheduled for prenatal visits or return to MAU as needed for preterm labor Contact information: [address] [place] [address] [phone]          Allergies as of 03/25/2017   No Known Allergies     Medication List    TAKE these medications   acetaminophen 325 MG tablet Commonly known as:  TYLENOL Take 650 mg by mouth every 6 (six) hours as needed for mild pain or headache.   azithromycin 250 MG tablet Commonly known as:  ZITHROMAX Take as directed: Two pills by mouth the first day, then one pill every day until completed   clotrimazole-betamethasone cream Commonly known as:  LOTRISONE Apply 1 application topically 2 (two) times daily.   cyclobenzaprine 10 MG tablet Commonly known as:  FLEXERIL Take 1 tablet (10 mg total) by mouth every 8 (eight) hours as needed for  muscle spasms.   metoCLOPramide 10 MG tablet Commonly known as:  REGLAN Take 1 tablet (10 mg total) by mouth 3 (three) times daily as needed for nausea.   NIFEdipine 30 MG 24 hr tablet Commonly known as:  PROCARDIA-XL/ADALAT-CC/NIFEDICAL-XL Take 1 tablet (30 mg total) by mouth daily. Can increase to twice a day as needed for symptomatic contractions   OB COMPLETE PETITE 35-5-1-200 MG Caps Take 1 tablet by mouth daily.   PROBIOTIC PO Take 1 capsule by mouth daily.   SYSTANE 0.4-0.3 % Soln Generic drug:  Polyethyl Glycol-Propyl Glycol Place 1 drop into both eyes daily.   terconazole 0.8 % vaginal cream Commonly known as:  TERAZOL 3 Place 1 applicator vaginally at bedtime.   valACYclovir 500 MG tablet Commonly known as:  VALTREX Take two tablets by mouth twice daily for ten days; then one tablet twice daily for remainder of pregnancy       Steward Drone Certified Nurse-Midwife 03/26/2017 8:53 AM

## 2017-04-05 ENCOUNTER — Other Ambulatory Visit (HOSPITAL_COMMUNITY)
Admission: RE | Admit: 2017-04-05 | Discharge: 2017-04-05 | Disposition: A | Discharge status: Home / Self Care | Payer: BLUE CROSS/BLUE SHIELD | Source: Ambulatory Visit | Attending: Advanced Practice Midwife | Admitting: Advanced Practice Midwife

## 2017-04-05 ENCOUNTER — Ambulatory Visit (INDEPENDENT_AMBULATORY_CARE_PROVIDER_SITE_OTHER): Payer: BLUE CROSS/BLUE SHIELD | Admitting: Advanced Practice Midwife

## 2017-04-05 ENCOUNTER — Encounter: Payer: Self-pay | Admitting: Advanced Practice Midwife

## 2017-04-05 VITALS — BP 120/82 | HR 97 | Wt 185.0 lb

## 2017-04-05 DIAGNOSIS — Z3490 Encounter for supervision of normal pregnancy, unspecified, unspecified trimester: Secondary | ICD-10-CM | POA: Insufficient documentation

## 2017-04-05 DIAGNOSIS — O9989 Other specified diseases and conditions complicating pregnancy, childbirth and the puerperium: Secondary | ICD-10-CM | POA: Diagnosis not present

## 2017-04-05 DIAGNOSIS — O99891 Other specified diseases and conditions complicating pregnancy: Secondary | ICD-10-CM

## 2017-04-05 DIAGNOSIS — Z3483 Encounter for supervision of other normal pregnancy, third trimester: Secondary | ICD-10-CM

## 2017-04-05 DIAGNOSIS — M549 Dorsalgia, unspecified: Secondary | ICD-10-CM

## 2017-04-05 DIAGNOSIS — O34219 Maternal care for unspecified type scar from previous cesarean delivery: Secondary | ICD-10-CM

## 2017-04-05 MED ORDER — COMFORT FIT MATERNITY SUPP MED MISC: 1.0000 | Freq: Every day | 1 refills | Status: DC

## 2017-04-05 NOTE — Progress Notes (Signed)
Bedside ultrasound perform for presentation. Patient is vertex. Armandina StammerJennifer Dynastie Knoop RNBSN

## 2017-04-05 NOTE — Patient Instructions (Signed)
Vaginal Delivery Vaginal delivery means that you will give birth by pushing your baby out of your birth canal (vagina). A team of health care providers will help you before, during, and after vaginal delivery. Birth experiences are unique for every woman and every pregnancy, and birth experiences vary depending on where you choose to give birth. What should I do to prepare for my baby's birth? Before your baby is born, it is important to talk with your health care provider about:  Your labor and delivery preferences. These may include: ? Medicines that you may be given. ? How you will manage your pain. This might include non-medical pain relief techniques or injectable pain relief such as epidural analgesia. ? How you and your baby will be monitored during labor and delivery. ? Who may be in the labor and delivery room with you. ? Your feelings about surgical delivery of your baby (cesarean delivery, or C-section) if this becomes necessary. ? Your feelings about receiving donated blood through an IV tube (blood transfusion) if this becomes necessary.  Whether you are able: ? To take pictures or videos of the birth. ? To eat during labor and delivery. ? To move around, walk, or change positions during labor and delivery.  What to expect after your baby is born, such as: ? Whether delayed umbilical cord clamping and cutting is offered. ? Who will care for your baby right after birth. ? Medicines or tests that may be recommended for your baby. ? Whether breastfeeding is supported in your hospital or birth center. ? How long you will be in the hospital or birth center.  How any medical conditions you have may affect your baby or your labor and delivery experience.  To prepare for your baby's birth, you should also:  Attend all of your health care visits before delivery (prenatal visits) as recommended by your health care provider. This is important.  Prepare your home for your baby's  arrival. Make sure that you have: ? Diapers. ? Baby clothing. ? Feeding equipment. ? Safe sleeping arrangements for you and your baby.  Install a car seat in your vehicle. Have your car seat checked by a certified car seat installer to make sure that it is installed safely.  Think about who will help you with your new baby at home for at least the first several weeks after delivery.  What can I expect when I arrive at the birth center or hospital? Once you are in labor and have been admitted into the hospital or birth center, your health care provider may:  Review your pregnancy history and any concerns you have.  Insert an IV tube into one of your veins. This is used to give you fluids and medicines.  Check your blood pressure, pulse, temperature, and heart rate (vital signs).  Check whether your bag of water (amniotic sac) has broken (ruptured).  Talk with you about your birth plan and discuss pain control options.  Monitoring Your health care provider may monitor your contractions (uterine monitoring) and your baby's heart rate (fetal monitoring). You may need to be monitored:  Often, but not continuously (intermittently).  All the time or for long periods at a time (continuously). Continuous monitoring may be needed if: ? You are taking certain medicines, such as medicine to relieve pain or make your contractions stronger. ? You have pregnancy or labor complications.  Monitoring may be done by:  Placing a special stethoscope or a handheld monitoring device on your abdomen to   check your baby's heartbeat, and feeling your abdomen for contractions. This method of monitoring does not continuously record your baby's heartbeat or your contractions.  Placing monitors on your abdomen (external monitors) to record your baby's heartbeat and the frequency and length of contractions. You may not have to wear external monitors all the time.  Placing monitors inside of your uterus  (internal monitors) to record your baby's heartbeat and the frequency, length, and strength of your contractions. ? Your health care provider may use internal monitors if he or she needs more information about the strength of your contractions or your baby's heart rate. ? Internal monitors are put in place by passing a thin, flexible wire through your vagina and into your uterus. Depending on the type of monitor, it may remain in your uterus or on your baby's head until birth. ? Your health care provider will discuss the benefits and risks of internal monitoring with you and will ask for your permission before inserting the monitors.  Telemetry. This is a type of continuous monitoring that can be done with external or internal monitors. Instead of having to stay in bed, you are able to move around during telemetry. Ask your health care provider if telemetry is an option for you.  Physical exam Your health care provider may perform a physical exam. This may include:  Checking whether your baby is positioned: ? With the head toward your vagina (head-down). This is most common. ? With the head toward the top of your uterus (head-up or breech). If your baby is in a breech position, your health care provider may try to turn your baby to a head-down position so you can deliver vaginally. If it does not seem that your baby can be born vaginally, your provider may recommend surgery to deliver your baby. In rare cases, you may be able to deliver vaginally if your baby is head-up (breech delivery). ? Lying sideways (transverse). Babies that are lying sideways cannot be delivered vaginally.  Checking your cervix to determine: ? Whether it is thinning out (effacing). ? Whether it is opening up (dilating). ? How low your baby has moved into your birth canal.  What are the three stages of labor and delivery?  Normal labor and delivery is divided into the following three stages: Stage 1  Stage 1 is the  longest stage of labor, and it can last for hours or days. Stage 1 includes: ? Early labor. This is when contractions may be irregular, or regular and mild. Generally, early labor contractions are more than 10 minutes apart. ? Active labor. This is when contractions get longer, more regular, more frequent, and more intense. ? The transition phase. This is when contractions happen very close together, are very intense, and may last longer than during any other part of labor.  Contractions generally feel mild, infrequent, and irregular at first. They get stronger, more frequent (about every 2-3 minutes), and more regular as you progress from early labor through active labor and transition.  Many women progress through stage 1 naturally, but you may need help to continue making progress. If this happens, your health care provider may talk with you about: ? Rupturing your amniotic sac if it has not ruptured yet. ? Giving you medicine to help make your contractions stronger and more frequent.  Stage 1 ends when your cervix is completely dilated to 4 inches (10 cm) and completely effaced. This happens at the end of the transition phase. Stage 2  Once   your cervix is completely effaced and dilated to 4 inches (10 cm), you may start to feel an urge to push. It is common for the body to naturally take a rest before feeling the urge to push, especially if you received an epidural or certain other pain medicines. This rest period may last for up to 1-2 hours, depending on your unique labor experience.  During stage 2, contractions are generally less painful, because pushing helps relieve contraction pain. Instead of contraction pain, you may feel stretching and burning pain, especially when the widest part of your baby's head passes through the vaginal opening (crowning).  Your health care provider will closely monitor your pushing progress and your baby's progress through the vagina during stage 2.  Your  health care provider may massage the area of skin between your vaginal opening and anus (perineum) or apply warm compresses to your perineum. This helps it stretch as the baby's head starts to crown, which can help prevent perineal tearing. ? In some cases, an incision may be made in your perineum (episiotomy) to allow the baby to pass through the vaginal opening. An episiotomy helps to make the opening of the vagina larger to allow more room for the baby to fit through.  It is very important to breathe and focus so your health care provider can control the delivery of your baby's head. Your health care provider may have you decrease the intensity of your pushing, to help prevent perineal tearing.  After delivery of your baby's head, the shoulders and the rest of the body generally deliver very quickly and without difficulty.  Once your baby is delivered, the umbilical cord may be cut right away, or this may be delayed for 1-2 minutes, depending on your baby's health. This may vary among health care providers, hospitals, and birth centers.  If you and your baby are healthy enough, your baby may be placed on your chest or abdomen to help maintain the baby's temperature and to help you bond with each other. Some mothers and babies start breastfeeding at this time. Your health care team will dry your baby and help keep your baby warm during this time.  Your baby may need immediate care if he or she: ? Showed signs of distress during labor. ? Has a medical condition. ? Was born too early (prematurely). ? Had a bowel movement before birth (meconium). ? Shows signs of difficulty transitioning from being inside the uterus to being outside of the uterus. If you are planning to breastfeed, your health care team will help you begin a feeding. Stage 3  The third stage of labor starts immediately after the birth of your baby and ends after you deliver the placenta. The placenta is an organ that develops  during pregnancy to provide oxygen and nutrients to your baby in the womb.  Delivering the placenta may require some pushing, and you may have mild contractions. Breastfeeding can stimulate contractions to help you deliver the placenta.  After the placenta is delivered, your uterus should tighten (contract) and become firm. This helps to stop bleeding in your uterus. To help your uterus contract and to control bleeding, your health care provider may: ? Give you medicine by injection, through an IV tube, by mouth, or through your rectum (rectally). ? Massage your abdomen or perform a vaginal exam to remove any blood clots that are left in your uterus. ? Empty your bladder by placing a thin, flexible tube (catheter) into your bladder. ? Encourage   you to breastfeed your baby. After labor is over, you and your baby will be monitored closely to ensure that you are both healthy until you are ready to go home. Your health care team will teach you how to care for yourself and your baby. This information is not intended to replace advice given to you by your health care provider. Make sure you discuss any questions you have with your health care provider. Document Released: 10/07/2007 Document Revised: 07/18/2015 Document Reviewed: 01/12/2015 Elsevier Interactive Patient Education  2018 [employer]  

## 2017-04-05 NOTE — Progress Notes (Signed)
   PRENATAL VISIT NOTE  Subjective:  Brianna Palmer is a 32 y.o. [id number] at 6815w6d being seen today for ongoing prenatal care.  She is currently monitored for the following issues for this high-risk pregnancy and has Depressive disorder, not elsewhere classified; Supervision of other normal pregnancy, antepartum; Herpes genitalis in women; Previous cesarean delivery affecting pregnancy; Migraine without aura and with status migrainosus, not intractable; Muscle spasm; Headache in pregnancy; and Nausea and vomiting during pregnancy on their problem list.  Patient reports backache and occasional contractions.  Contractions: Irritability. Vag. Bleeding: None.  Movement: Present. Denies leaking of fluid.   The following portions of the patient's history were reviewed and updated as appropriate: allergies, current medications, past family history, past medical history, past social history, past surgical history and problem list. Problem list updated.  Objective:   Vitals:   04/05/17 0902  BP: 120/82  Pulse: 97  Weight: 185 lb (83.9 kg)    Fetal Status: Fetal Heart Rate (bpm): 143   Movement: Present     General:  Alert, oriented and cooperative. Patient is in no acute distress.  Skin: Skin is warm and dry. No rash noted.   Cardiovascular: Normal heart rate noted  Respiratory: Normal respiratory effort, no problems with respiration noted  Abdomen: Soft, gravid, appropriate for gestational age.  Pain/Pressure: Present     Pelvic: Cervical exam performed       Closed/30%/High, No presenting part palpable in pelvis    US ordered  Extremities: Normal range of motion.  Edema: Trace  Mental Status:  Normal mood and affect. Normal behavior. Normal judgment and thought content.   Assessment and Plan:  Pregnancy: G5P1031 at 10215w6d  1. Prenatal care, antepartum      Will get US for presentation today  >> Vertex  - Culture, beta strep (group b only) - GC[facility]Chlamydia probe amp [facility]  2. Previous cesarean delivery affecting pregnancy      Discussed labor signs, effacement and process of labor  3. Back pain affecting pregnancy in second trimester     Pregnancy support belt ordered  Term labor symptoms and general obstetric precautions including but not limited to vaginal bleeding, contractions, leaking of fluid and fetal movement were reviewed in detail with the patient. Please refer to After Visit Summary for other counseling recommendations.  Return in about 1 week (around 04/12/2017) for [facility].   Wynelle BourgeoisMarie Pal Palmer, CNM

## 2017-04-05 NOTE — Progress Notes (Signed)
Note created in error/duplicate

## 2017-04-06 LAB — GC/CHLAMYDIA PROBE AMP (~~LOC~~) NOT AT ARMC
Chlamydia: NEGATIVE
NEISSERIA GONORRHEA: NEGATIVE

## 2017-04-09 LAB — CULTURE, BETA STREP (GROUP B ONLY): STREP GP B CULTURE: NEGATIVE

## 2017-04-12 ENCOUNTER — Ambulatory Visit (INDEPENDENT_AMBULATORY_CARE_PROVIDER_SITE_OTHER): Payer: BLUE CROSS/BLUE SHIELD | Admitting: Advanced Practice Midwife

## 2017-04-12 VITALS — BP 118/84 | HR 97 | Wt 186.0 lb

## 2017-04-12 DIAGNOSIS — O34219 Maternal care for unspecified type scar from previous cesarean delivery: Secondary | ICD-10-CM

## 2017-04-12 DIAGNOSIS — A6009 Herpesviral infection of other urogenital tract: Secondary | ICD-10-CM

## 2017-04-12 DIAGNOSIS — Z3480 Encounter for supervision of other normal pregnancy, unspecified trimester: Secondary | ICD-10-CM

## 2017-04-12 NOTE — Progress Notes (Signed)
Pt c/o hand & arm pain

## 2017-04-12 NOTE — Progress Notes (Signed)
   PRENATAL VISIT NOTE  Subjective:  Brianna Palmer is a 32 y.o. [id number] at 3036w6d being seen today for ongoing prenatal care.  She is currently monitored for the following issues for this low-risk pregnancy and has Depressive disorder, not elsewhere classified; Supervision of other normal pregnancy, antepartum; Herpes genitalis in women; Previous cesarean delivery affecting pregnancy; Migraine without aura and with status migrainosus, not intractable; Muscle spasm; Headache in pregnancy; and Nausea and vomiting during pregnancy on their problem list.  Patient reports occasional contractions.  Contractions: Irritability. Vag. Bleeding: None.  Movement: Present. Denies leaking of fluid.   The following portions of the patient's history were reviewed and updated as appropriate: allergies, current medications, past family history, past medical history, past social history, past surgical history and problem list. Problem list updated.  Objective:   Vitals:   04/12/17 0912  BP: 118/84  Pulse: 97  Weight: 186 lb (84.4 kg)    Fetal Status: Fetal Heart Rate (bpm): 138 Fundal Height: 38 cm Movement: Present  Presentation: Vertex  General:  Alert, oriented and cooperative. Patient is in no acute distress.  Skin: Skin is warm and dry. No rash noted.   Cardiovascular: Normal heart rate noted  Respiratory: Normal respiratory effort, no problems with respiration noted  Abdomen: Soft, gravid, appropriate for gestational age.  Pain/Pressure: Present     Pelvic: Cervical exam performed Dilation: Closed Effacement (%): 30 Station: -3  Extremities: Normal range of motion.  Edema: Trace  Mental Status: Normal mood and affect. Normal behavior. Normal judgment and thought content.   Assessment and Plan:  Pregnancy: G5P1031 at 2436w6d  1. Supervision of other normal pregnancy, antepartum --Anticipatory guidance about next visits and signs of labor.    2. Previous cesarean delivery affecting  pregnancy --Desires TOLAC. Consent form in chart.    3. Herpes genitalis in women --On prophylaxis.  Term labor symptoms and general obstetric precautions including but not limited to vaginal bleeding, contractions, leaking of fluid and fetal movement were reviewed in detail with the patient. Please refer to After Visit Summary for other counseling recommendations.  Return in about 1 week (around 04/19/2017).  Future Appointments  Date Time Provider Department Center  04/19/2017  9:00 AM Aviva SignsWilliams, Marie L, CNM [facility] None    Sharen CounterLisa Leftwich-Kirby, CNM

## 2017-04-12 NOTE — Patient Instructions (Signed)

## 2017-04-14 ENCOUNTER — Telehealth: Payer: Self-pay

## 2017-04-14 NOTE — Telephone Encounter (Signed)
Patient called stating that she is tired and ready to be out of work. I explained to the patient that she has FMLA and she can start her 12 weeks whenever she prefers but the docotors will have to have medical reason to write her out of work.  Made patient appointment tomorrow with Dr. Adrian BlackwaterStinson to discuss. Brianna StammerJennifer Howard RN BSN

## 2017-04-15 ENCOUNTER — Encounter: Payer: Self-pay | Admitting: Family Medicine

## 2017-04-15 ENCOUNTER — Ambulatory Visit (INDEPENDENT_AMBULATORY_CARE_PROVIDER_SITE_OTHER): Payer: BLUE CROSS/BLUE SHIELD | Admitting: Family Medicine

## 2017-04-15 VITALS — BP 125/80 | HR 99 | Wt 188.0 lb

## 2017-04-15 DIAGNOSIS — Z3480 Encounter for supervision of other normal pregnancy, unspecified trimester: Secondary | ICD-10-CM

## 2017-04-15 DIAGNOSIS — O34219 Maternal care for unspecified type scar from previous cesarean delivery: Secondary | ICD-10-CM

## 2017-04-15 DIAGNOSIS — O26899 Other specified pregnancy related conditions, unspecified trimester: Secondary | ICD-10-CM

## 2017-04-15 DIAGNOSIS — G5600 Carpal tunnel syndrome, unspecified upper limb: Secondary | ICD-10-CM

## 2017-04-15 DIAGNOSIS — A6009 Herpesviral infection of other urogenital tract: Secondary | ICD-10-CM

## 2017-04-15 MED ORDER — COMFORT FIT MATERNITY SUPP MED MISC: 1.0000 | Freq: Every day | 1 refills | Status: DC

## 2017-04-15 MED ORDER — WRIST SPLINT/COCK-UP/LEFT SM MISC: 1.0000 [IU] | Freq: Every day | 0 refills | Status: DC

## 2017-04-15 NOTE — Progress Notes (Signed)
   PRENATAL VISIT NOTE  Subjective:  Brianna Palmer is a 32 y.o. [id number] at 7433w2d being seen today for ongoing prenatal care.  She is currently monitored for the following issues for this low-risk pregnancy and has Depressive disorder, not elsewhere classified; Supervision of other normal pregnancy, antepartum; Herpes genitalis in women; Previous cesarean delivery affecting pregnancy; Migraine without aura and with status migrainosus, not intractable; Muscle spasm; Headache in pregnancy; and Nausea and vomiting during pregnancy on their problem list.  Patient reports carpal tunnel in left hand.  Contractions: Irritability.  .  Movement: Present. Denies leaking of fluid.   The following portions of the patient's history were reviewed and updated as appropriate: allergies, current medications, past family history, past medical history, past social history, past surgical history and problem list. Problem list updated.  Objective:   Vitals:   04/15/17 1121  BP: 125/80  Pulse: 99  Weight: 188 lb (85.3 kg)    Fetal Status:     Movement: Present     General:  Alert, oriented and cooperative. Patient is in no acute distress.  Skin: Skin is warm and dry. No rash noted.   Cardiovascular: Normal heart rate noted  Respiratory: Normal respiratory effort, no problems with respiration noted  Abdomen: Soft, gravid, appropriate for gestational age.  Pain/Pressure: Present     Pelvic: Cervical exam deferred        Extremities: Normal range of motion.  Edema: Trace  Mental Status: Normal mood and affect. Normal behavior. Normal judgment and thought content.   Assessment and Plan:  Pregnancy: G5P1031 at 1533w2d  1. Supervision of other normal pregnancy, antepartum FHT and FH normal. Requesting out of work - I discussed that I did not have a medical reason to take her out of work.  2. Previous cesarean delivery affecting pregnancy Desires tolac. Discussed induction between 40-41 weeks. Discussed that  induction would increase risk of cesarean delivery, particularly if cervix is unfavorable. Will check cervix next visit  3. Herpes genitalis in women On prophylaxis  4. Carpal tunnel syndrome during pregnancy Brace prescribed  Preterm labor symptoms and general obstetric precautions including but not limited to vaginal bleeding, contractions, leaking of fluid and fetal movement were reviewed in detail with the patient. Please refer to After Visit Summary for other counseling recommendations.  No follow-ups on file.  Future Appointments  Date Time Provider Department Center  04/21/2017 10:15 AM Levie HeritageStinson, Jacob J, DO [facility] None    Levie HeritageJacob J Stinson, DO

## 2017-04-15 NOTE — Progress Notes (Signed)
Patient states that she has carpal tunnel and has to walk in and out of work from a distance. Armandina StammerJennifer Vianne Grieshop RN

## 2017-04-18 ENCOUNTER — Telehealth: Payer: Self-pay

## 2017-04-18 NOTE — Telephone Encounter (Signed)
Patient called stating she needs a note for work to wear open toe sandals (works at [employer]) because of swelling. Patient states she will come pick up the letter. Armandina StammerJennifer Howard RN

## 2017-04-19 ENCOUNTER — Inpatient Hospital Stay (EMERGENCY_DEPARTMENT_HOSPITAL)
Admission: AD | Admit: 2017-04-19 | Discharge: 2017-04-20 | Disposition: A | Discharge status: Home / Self Care | Payer: BLUE CROSS/BLUE SHIELD | Source: Ambulatory Visit | Attending: Family Medicine | Admitting: Family Medicine

## 2017-04-19 ENCOUNTER — Encounter (HOSPITAL_COMMUNITY): Payer: Self-pay | Admitting: *Deleted

## 2017-04-19 ENCOUNTER — Encounter: Payer: BLUE CROSS/BLUE SHIELD | Admitting: Advanced Practice Midwife

## 2017-04-19 DIAGNOSIS — O26899 Other specified pregnancy related conditions, unspecified trimester: Secondary | ICD-10-CM

## 2017-04-19 DIAGNOSIS — O1203 Gestational edema, third trimester: Secondary | ICD-10-CM

## 2017-04-19 DIAGNOSIS — O34219 Maternal care for unspecified type scar from previous cesarean delivery: Secondary | ICD-10-CM

## 2017-04-19 DIAGNOSIS — O219 Vomiting of pregnancy, unspecified: Secondary | ICD-10-CM

## 2017-04-19 DIAGNOSIS — A6009 Herpesviral infection of other urogenital tract: Secondary | ICD-10-CM

## 2017-04-19 DIAGNOSIS — G5600 Carpal tunnel syndrome, unspecified upper limb: Secondary | ICD-10-CM

## 2017-04-19 DIAGNOSIS — Z3A39 39 weeks gestation of pregnancy: Secondary | ICD-10-CM

## 2017-04-19 DIAGNOSIS — O479 False labor, unspecified: Secondary | ICD-10-CM

## 2017-04-19 NOTE — MAU Note (Signed)
PT SAYS HER LEFT INNER ARM  HURTS X 1 MTH -  NO INJURY .     FEELS  UC'S   -  [place] [facility]  [facility]  [place]  [facility] .   HAD POSITIVE BLOOD TEST FOR HSV-  BUT  NEVER HAD AN OUTBREAK-   DOESN'T TAKE VALTREX.     2 WEEKS AGO-  CLOSED .  WANTS  VBAC.

## 2017-04-20 ENCOUNTER — Telehealth: Payer: Self-pay

## 2017-04-20 ENCOUNTER — Encounter (HOSPITAL_COMMUNITY): Payer: Self-pay

## 2017-04-20 DIAGNOSIS — Z3A39 39 weeks gestation of pregnancy: Secondary | ICD-10-CM

## 2017-04-20 DIAGNOSIS — O471 False labor at or after 37 completed weeks of gestation: Secondary | ICD-10-CM | POA: Diagnosis not present

## 2017-04-20 NOTE — Discharge Instructions (Signed)
Braxton Hicks Contractions °Contractions of the uterus can occur throughout pregnancy, but they are not always a sign that you are in labor. You may have practice contractions called Braxton Hicks contractions. These false labor contractions are sometimes confused with true labor. °What are Braxton Hicks contractions? °Braxton Hicks contractions are tightening movements that occur in the muscles of the uterus before labor. Unlike true labor contractions, these contractions do not result in opening (dilation) and thinning of the cervix. Toward the end of pregnancy (32-34 weeks), Braxton Hicks contractions can happen more often and may become stronger. These contractions are sometimes difficult to tell apart from true labor because they can be very uncomfortable. You should not feel embarrassed if you go to the hospital with false labor. °Sometimes, the only way to tell if you are in true labor is for your health care provider to look for changes in the cervix. The health care provider will do a physical exam and may monitor your contractions. If you are not in true labor, the exam should show that your cervix is not dilating and your water has not broken. °If there are other health problems associated with your pregnancy, it is completely safe for you to be sent home with false labor. You may continue to have Braxton Hicks contractions until you go into true labor. °How to tell the difference between true labor and false labor °True labor °· Contractions last 30-70 seconds. °· Contractions become very regular. °· Discomfort is usually felt in the top of the uterus, and it spreads to the lower abdomen and low back. °· Contractions do not go away with walking. °· Contractions usually become more intense and increase in frequency. °· The cervix dilates and gets thinner. °False labor °· Contractions are usually shorter and not as strong as true labor contractions. °· Contractions are usually irregular. °· Contractions  are often felt in the front of the lower abdomen and in the groin. °· Contractions may go away when you walk around or change positions while lying down. °· Contractions get weaker and are shorter-lasting as time goes on. °· The cervix usually does not dilate or become thin. °Follow these instructions at home: °· Take over-the-counter and prescription medicines only as told by your health care provider. °· Keep up with your usual exercises and follow other instructions from your health care provider. °· Eat and drink lightly if you think you are going into labor. °· If Braxton Hicks contractions are making you uncomfortable: °? Change your position from lying down or resting to walking, or change from walking to resting. °? Sit and rest in a tub of warm water. °? Drink enough fluid to keep your urine pale yellow. Dehydration may cause these contractions. °? Do slow and deep breathing several times an hour. °· Keep all follow-up prenatal visits as told by your health care provider. This is important. °Contact a health care provider if: °· You have a fever. °· You have continuous pain in your abdomen. °Get help right away if: °· Your contractions become stronger, more regular, and closer together. °· You have fluid leaking or gushing from your vagina. °· You pass blood-tinged mucus (bloody show). °· You have bleeding from your vagina. °· You have low back pain that you never had before. °· You feel your baby’s head pushing down and causing pelvic pressure. °· Your baby is not moving inside you as much as it used to. °Summary °· Contractions that occur before labor are called Braxton   Hicks contractions, false labor, or practice contractions. °· Braxton Hicks contractions are usually shorter, weaker, farther apart, and less regular than true labor contractions. True labor contractions usually become progressively stronger and regular and they become more frequent. °· Manage discomfort from Braxton Hicks contractions by  changing position, resting in a warm bath, drinking plenty of water, or practicing deep breathing. °This information is not intended to replace advice given to you by your health care provider. Make sure you discuss any questions you have with your health care provider. °Document Released: 05/13/2016 Document Revised: 05/13/2016 Document Reviewed: 05/13/2016 °Elsevier Interactive Patient Education © 2018 [employer] Inc[employer] ° °

## 2017-04-20 NOTE — MAU Provider Note (Signed)
History     CSN: [id number]  Arrival date and time: 04/19/17 2332   First Provider Initiated Contact with Patient 04/20/17 0037      No chief complaint on file.  Brianna Palmer is a 32 y.o. [id number] at 3254w0d who presents today with a "plethora of complaints". She is here with arm pain, but did not realize she had carpel tunnel of a rx for a wrist brace that was sent to her pharmacy. She is also worried because she wants to have a VBAC, but she thinks that the baby will be too big if she doesn't have the baby soon. She reports that she has had BH contractions off and on for "weeks", and does not think this is normal. She is also having a lot of swelling in her legs that she does think is normal. She told the RN that she needs to be off of work. She denies any VB or LOF. She reports normal fetal movement. Patient also reports that she is not taking the Valtrex that was prescribed. She states that she feels like she does not need it.   Past Medical History:  Diagnosis Date  . Headache     Past Surgical History:  Procedure Laterality Date  . BUNIONECTOMY    . CESAREAN SECTION N/A 03/30/2012   Procedure: CESAREAN SECTION;  Surgeon: Antionette CharLisa Jackson-Moore, MD;  Location: [facility];  Service: Obstetrics;  Laterality: N/A;  Primary Cesarean Section Delivery Baby Boy @ 0157, Apgars 8/9    Family History  Adopted: Yes  Problem Relation Age of Onset  . Other Neg Hx   . Cancer Neg Hx   . Hypertension Neg Hx   . Stroke Neg Hx     Social History   Tobacco Use  . Smoking status: Former Smoker    Packs/day: 0.25    Types: Cigarettes    Last attempt to quit: 07/01/2013    Years since quitting: 3.8  . Smokeless tobacco: Never Used  Substance Use Topics  . Alcohol use: No    Alcohol/week: 0.0 oz    Comment: socially  . Drug use: No    Allergies: No Known Allergies  Medications Prior to Admission  Medication Sig Dispense Refill Last Dose  . Polyethyl Glycol-Propyl Glycol (SYSTANE) 0.4-0.3 %  SOLN Place 1 drop into both eyes daily.   Past Week at Unknown time  . Probiotic Product (PROBIOTIC PO) Take 1 capsule by mouth daily.   Past Month at Unknown time  . acetaminophen (TYLENOL) 325 MG tablet Take 650 mg by mouth every 6 (six) hours as needed for mild pain or headache.   Unknown at Unknown time  . Elastic Bandages & Supports (COMFORT FIT MATERNITY SUPP MED) MISC 1 Device by Does not apply route daily. 1 each 1   . Elastic Bandages & Supports (WRIST SPLINT/COCK-UP/LEFT SM) MISC 1 Units by Does not apply route at bedtime. 1 each 0   . valACYclovir (VALTREX) 500 MG tablet Take two tablets by mouth twice daily for ten days; then one tablet twice daily for remainder of pregnancy (Patient not taking: Reported on 04/05/2017) 180 tablet 5 Unknown at Unknown time    Review of Systems  Constitutional: Negative for chills and fever.  Cardiovascular: Positive for leg swelling.  Genitourinary: Negative for pelvic pain, vaginal bleeding and vaginal discharge.  Neurological: Negative for headaches.   Physical Exam   Blood pressure 120/80, pulse 88, temperature 98.2 F (36.8 C), temperature source Oral, resp. rate 20, height 5'  3" (1.6 m), weight 192 lb 4 oz (87.2 kg), last menstrual period 07/21/2016, currently breastfeeding.  Physical Exam  Nursing note and vitals reviewed. Constitutional: She is oriented to person, place, and time. She appears well-developed and well-nourished. No distress.  HENT:  Head: Normocephalic.  Cardiovascular: Normal rate.  Respiratory: Effort normal.  GI: Soft. There is no tenderness. There is no rebound.  Neurological: She is alert and oriented to person, place, and time.  Skin: Skin is warm and dry.  Psychiatric: She has a normal mood and affect.   FHT: 120, moderate with 15x15 accels, no decels Toco: about every 7-10 mins   Cervix: FT/thick  MAU Course  Procedures  MDM Reviewed labor and delivery course from her last pregnancy. Patient advised that  it looks like she came in with early labor sx, and then found to have oligo so was kept for induction. She did progress to about 3cm in 12 hours, and then had c-section for fetal bradycardia. Baby was SGA, and that likely the reason she had low fluid and the baby did not tolerate labor was because it was a small baby. Patient somewhat reassured that since this baby is bigger it may actually do better during labor than a small baby. Patient also reassured that everything she is feeling is normal at this stage of pregnancy. She did not seem to really want to accept that what she was experiencing was normal, but overall reassured. Stressed the importance of taking Valtrex especially if she wants a TOLAC/VBAC as she cannot have a vaginal delivery if she has an HSV outbreak.   Assessment and Plan   1. False labor   2. Nausea and vomiting during pregnancy   3. Herpes genitalis in women   4. Previous cesarean delivery affecting pregnancy   5. Edema during pregnancy in third trimester   6. Carpal tunnel syndrome during pregnancy   7. [redacted] weeks gestation of pregnancy    DC home Comfort measures reviewed  3rd Trimester precautions  labor precautions  Fetal kick counts RX: none  Return to MAU as needed FU with OB as planned  Follow-up Information    [facility] Follow up.   Specialty:  Obstetrics and Gynecology Contact information: [address] [place] [address] [phone]           Thressa Sheller 04/20/2017, 12:40 AM

## 2017-04-20 NOTE — Telephone Encounter (Signed)
Patient called stating that she has never started her Valtrex prescription.  Patient states she wants to know the risks. Explained to patient that she should start medication now since she is [redacted] weeks pregnant. Explained that if she were to get an outbreak it would mean she would have to have a c-section delivery. Patient states she will begin taking prescription this evening. Armandina StammerJennifer Howard RN

## 2017-04-21 ENCOUNTER — Ambulatory Visit (INDEPENDENT_AMBULATORY_CARE_PROVIDER_SITE_OTHER): Payer: BLUE CROSS/BLUE SHIELD | Admitting: Family Medicine

## 2017-04-21 ENCOUNTER — Telehealth (HOSPITAL_COMMUNITY): Payer: Self-pay | Admitting: *Deleted

## 2017-04-21 ENCOUNTER — Telehealth: Payer: Self-pay

## 2017-04-21 ENCOUNTER — Encounter (HOSPITAL_COMMUNITY): Payer: Self-pay | Admitting: *Deleted

## 2017-04-21 VITALS — BP 124/74 | HR 110 | Wt 191.0 lb

## 2017-04-21 DIAGNOSIS — O34219 Maternal care for unspecified type scar from previous cesarean delivery: Secondary | ICD-10-CM

## 2017-04-21 DIAGNOSIS — O26899 Other specified pregnancy related conditions, unspecified trimester: Secondary | ICD-10-CM

## 2017-04-21 DIAGNOSIS — A6009 Herpesviral infection of other urogenital tract: Secondary | ICD-10-CM

## 2017-04-21 DIAGNOSIS — G5600 Carpal tunnel syndrome, unspecified upper limb: Secondary | ICD-10-CM

## 2017-04-21 DIAGNOSIS — Z3480 Encounter for supervision of other normal pregnancy, unspecified trimester: Secondary | ICD-10-CM

## 2017-04-21 NOTE — Progress Notes (Signed)
   PRENATAL VISIT NOTE  Subjective:  Brianna Palmer is a 32 y.o. [id number] at 7569w1d being seen today for ongoing prenatal care.  She is currently monitored for the following issues for this low-risk pregnancy and has Depressive disorder, not elsewhere classified; Supervision of other normal pregnancy, antepartum; Herpes genitalis in women; Previous cesarean delivery affecting pregnancy; Migraine without aura and with status migrainosus, not intractable; Muscle spasm; Headache in pregnancy; and Nausea and vomiting during pregnancy on their problem list.  Patient reports swelling, difficulty walking at work.  Contractions: Irritability. Vag. Bleeding: None.  Movement: Present. Denies leaking of fluid.   The following portions of the patient's history were reviewed and updated as appropriate: allergies, current medications, past family history, past medical history, past social history, past surgical history and problem list. Problem list updated.  Objective:   Vitals:   04/21/17 1028  BP: 124/74  Pulse: (!) 110  Weight: 191 lb (86.6 kg)    Fetal Status: Fetal Heart Rate (bpm): 137   Movement: Present  Presentation: Vertex  General:  Alert, oriented and cooperative. Patient is in no acute distress.  Skin: Skin is warm and dry. No rash noted.   Cardiovascular: Normal heart rate noted  Respiratory: Normal respiratory effort, no problems with respiration noted  Abdomen: Soft, gravid, appropriate for gestational age.  Pain/Pressure: Present     Pelvic: Cervical exam performed Dilation: Closed Effacement (%): Thick Station: -3  Extremities: Normal range of motion.  Edema: Mild pitting, slight indentation  Mental Status: Normal mood and affect. Normal behavior. Normal judgment and thought content.   Assessment and Plan:  Pregnancy: G5P1031 at 2269w1d  1. Supervision of other normal pregnancy, antepartum FHT and FH normal. Work restrictions to avoid prolonged walking given. Slide in shoes  2.  Previous cesarean delivery affecting pregnancy Desires VBAC  3. Carpal tunnel syndrome during pregnancy  4. Herpes genitalis in women Valtrex prescribed  Term labor symptoms and general obstetric precautions including but not limited to vaginal bleeding, contractions, leaking of fluid and fetal movement were reviewed in detail with the patient. Please refer to After Visit Summary for other counseling recommendations.  No follow-ups on file.  No future appointments.  Levie HeritageJacob J Stinson, DO

## 2017-04-21 NOTE — Telephone Encounter (Signed)
Preadmission screen  

## 2017-04-21 NOTE — Progress Notes (Signed)
Patient returned to office, spoke with nurse - has decided that she would like repeat c/s as she hasn't dilated any. Pt scheduled for surgery. Preop orders entered.

## 2017-04-21 NOTE — Telephone Encounter (Signed)
Patient presented to office tearful stating she would like to proceed with repeat C-section.  Contacted Dr. Adrian BlackwaterStinson and patient can be put on first available for repeat C-section.   [place] and next available is tomorrow at 12:30.   Patient called and told to  - be at hospital at 10:30am -to be NPO (nothing to eat or drink) after midnight tonight -no lotions, powders, creams, etc on her  - take out all jewelery before going to hospital.    Patient states understanding. Armandina StammerJennifer Landry Lookingbill RN BSN

## 2017-04-22 ENCOUNTER — Inpatient Hospital Stay (HOSPITAL_COMMUNITY): Payer: BLUE CROSS/BLUE SHIELD | Admitting: Certified Registered"

## 2017-04-22 ENCOUNTER — Inpatient Hospital Stay (HOSPITAL_COMMUNITY)
Admission: AD | Admit: 2017-04-22 | Discharge: 2017-04-25 | DRG: 788 | Disposition: A | Discharge status: Home / Self Care | Payer: BLUE CROSS/BLUE SHIELD | Source: Ambulatory Visit | Attending: Obstetrics & Gynecology | Admitting: Obstetrics & Gynecology

## 2017-04-22 ENCOUNTER — Encounter (HOSPITAL_COMMUNITY): Payer: Self-pay | Admitting: *Deleted

## 2017-04-22 ENCOUNTER — Encounter (HOSPITAL_COMMUNITY)
Admission: AD | Disposition: A | Discharge status: Home / Self Care | Payer: Self-pay | Source: Ambulatory Visit | Attending: Obstetrics & Gynecology

## 2017-04-22 DIAGNOSIS — O34211 Maternal care for low transverse scar from previous cesarean delivery: Principal | ICD-10-CM | POA: Diagnosis present

## 2017-04-22 DIAGNOSIS — Z87891 Personal history of nicotine dependence: Secondary | ICD-10-CM | POA: Diagnosis not present

## 2017-04-22 DIAGNOSIS — Z3A39 39 weeks gestation of pregnancy: Secondary | ICD-10-CM

## 2017-04-22 DIAGNOSIS — Z98891 History of uterine scar from previous surgery: Secondary | ICD-10-CM

## 2017-04-22 LAB — TYPE AND SCREEN
ABO/RH(D): B POS
Antibody Screen: NEGATIVE

## 2017-04-22 LAB — RAPID HIV SCREEN (HIV 1/2 AB+AG)
HIV 1/2 Antibodies: NONREACTIVE
HIV-1 P24 ANTIGEN - HIV24: NONREACTIVE

## 2017-04-22 LAB — CBC
HCT: 35.7 % — ABNORMAL LOW (ref 36.0–46.0)
Hemoglobin: 12.4 g/dL (ref 12.0–15.0)
MCH: 28.1 pg (ref 26.0–34.0)
MCHC: 34.7 g/dL (ref 30.0–36.0)
MCV: 80.8 fL (ref 78.0–100.0)
PLATELETS: 208 10*3/uL (ref 150–400)
RBC: 4.42 MIL/uL (ref 3.87–5.11)
RDW: 15.4 % (ref 11.5–15.5)
WBC: 8.4 10*3/uL (ref 4.0–10.5)

## 2017-04-22 LAB — CREATININE, SERUM
Creatinine, Ser: 0.66 mg/dL (ref 0.44–1.00)
GFR calc Af Amer: >60 mL/min (ref >60)

## 2017-04-22 SURGERY — Surgical Case
Anesthesia: Spinal

## 2017-04-22 MED ORDER — SCOPOLAMINE 1 MG/3DAYS TD PT72
MEDICATED_PATCH | TRANSDERMAL | Status: DC | PRN
  Administered 2017-04-22: 1 via TRANSDERMAL

## 2017-04-22 MED ORDER — CEFAZOLIN SODIUM-DEXTROSE 2-4 GM/100ML-% IV SOLN
2.0000 g | INTRAVENOUS | Status: AC
  Administered 2017-04-22: 2 g via INTRAVENOUS
  Filled 2017-04-22: qty 100

## 2017-04-22 MED ORDER — PHENYLEPHRINE 8 MG IN D5W 100 ML (0.08MG/ML) PREMIX OPTIME
INJECTION | INTRAVENOUS | Status: AC
  Filled 2017-04-22: qty 100

## 2017-04-22 MED ORDER — ACETAMINOPHEN 325 MG PO TABS
650.0000 mg | ORAL_TABLET | ORAL | Status: DC | PRN
  Administered 2017-04-23 – 2017-04-25 (×2): 650 mg via ORAL
  Filled 2017-04-22 (×2): qty 2

## 2017-04-22 MED ORDER — NALBUPHINE HCL 10 MG/ML IJ SOLN: 5.0000 mg | INTRAMUSCULAR | Status: DC | PRN

## 2017-04-22 MED ORDER — LACTATED RINGERS IV SOLN
INTRAVENOUS | Status: DC | PRN
  Administered 2017-04-22: 14:00:00 via INTRAVENOUS

## 2017-04-22 MED ORDER — HYDROMORPHONE HCL 1 MG/ML IJ SOLN
INTRAMUSCULAR | Status: AC
  Filled 2017-04-22: qty 0.5

## 2017-04-22 MED ORDER — SIMETHICONE 80 MG PO CHEW
80.0000 mg | CHEWABLE_TABLET | ORAL | Status: DC
  Administered 2017-04-23 – 2017-04-24 (×3): 80 mg via ORAL
  Filled 2017-04-22 (×3): qty 1

## 2017-04-22 MED ORDER — TETANUS-DIPHTH-ACELL PERTUSSIS 5-2.5-18.5 LF-MCG/0.5 IM SUSP: 0.5000 mL | Freq: Once | INTRAMUSCULAR | Status: DC

## 2017-04-22 MED ORDER — NALBUPHINE HCL 10 MG/ML IJ SOLN: 5.0000 mg | Freq: Once | INTRAMUSCULAR | Status: DC | PRN

## 2017-04-22 MED ORDER — OXYCODONE HCL 5 MG PO TABS
10.0000 mg | ORAL_TABLET | ORAL | Status: DC | PRN
  Filled 2017-04-22 (×2): qty 2

## 2017-04-22 MED ORDER — LACTATED RINGERS IV SOLN
INTRAVENOUS | Status: DC
  Administered 2017-04-23: 01:00:00 via INTRAVENOUS

## 2017-04-22 MED ORDER — MEPERIDINE HCL 25 MG/ML IJ SOLN: 6.2500 mg | INTRAMUSCULAR | Status: DC | PRN

## 2017-04-22 MED ORDER — ZOLPIDEM TARTRATE 5 MG PO TABS: 5.0000 mg | ORAL_TABLET | Freq: Every evening | ORAL | Status: DC | PRN

## 2017-04-22 MED ORDER — COCONUT OIL OIL
1.0000 "application " | TOPICAL_OIL | Status: DC | PRN
  Filled 2017-04-22: qty 120

## 2017-04-22 MED ORDER — DIPHENHYDRAMINE HCL 25 MG PO CAPS
25.0000 mg | ORAL_CAPSULE | Freq: Four times a day (QID) | ORAL | Status: DC | PRN
  Administered 2017-04-23: 25 mg via ORAL

## 2017-04-22 MED ORDER — SODIUM CHLORIDE 0.9% FLUSH: 3.0000 mL | INTRAVENOUS | Status: DC | PRN

## 2017-04-22 MED ORDER — LACTATED RINGERS IV SOLN
INTRAVENOUS | Status: DC | PRN
Start: 2017-04-22 — End: 2017-04-22
  Administered 2017-04-22 (×3): via INTRAVENOUS

## 2017-04-22 MED ORDER — IBUPROFEN 600 MG PO TABS
600.0000 mg | ORAL_TABLET | Freq: Four times a day (QID) | ORAL | Status: DC
  Administered 2017-04-23 – 2017-04-25 (×11): 600 mg via ORAL
  Filled 2017-04-22 (×11): qty 1

## 2017-04-22 MED ORDER — SIMETHICONE 80 MG PO CHEW
80.0000 mg | CHEWABLE_TABLET | Freq: Three times a day (TID) | ORAL | Status: DC
  Administered 2017-04-22 – 2017-04-25 (×9): 80 mg via ORAL
  Filled 2017-04-22 (×9): qty 1

## 2017-04-22 MED ORDER — FENTANYL CITRATE (PF) 100 MCG/2ML IJ SOLN
INTRAMUSCULAR | Status: AC
  Filled 2017-04-22: qty 2

## 2017-04-22 MED ORDER — OXYTOCIN 40 UNITS IN LACTATED RINGERS INFUSION - SIMPLE MED: 2.5000 [IU]/h | INTRAVENOUS | Status: AC

## 2017-04-22 MED ORDER — DIPHENHYDRAMINE HCL 50 MG/ML IJ SOLN
INTRAMUSCULAR | Status: AC
  Filled 2017-04-22: qty 1

## 2017-04-22 MED ORDER — BUPIVACAINE HCL 0.5 % IJ SOLN
INTRAMUSCULAR | Status: DC | PRN
  Administered 2017-04-22: 10 mL

## 2017-04-22 MED ORDER — FENTANYL CITRATE (PF) 100 MCG/2ML IJ SOLN
INTRAMUSCULAR | Status: DC | PRN
  Administered 2017-04-22: 50 ug via INTRAVENOUS
  Administered 2017-04-22: 90 ug via INTRAVENOUS
  Administered 2017-04-22: 10 ug via INTRATHECAL

## 2017-04-22 MED ORDER — LACTATED RINGERS IV SOLN
125.0000 mL/h | INTRAVENOUS | Status: DC
  Administered 2017-04-22: 125 mL/h via INTRAVENOUS

## 2017-04-22 MED ORDER — SENNOSIDES-DOCUSATE SODIUM 8.6-50 MG PO TABS
2.0000 | ORAL_TABLET | ORAL | Status: DC
  Administered 2017-04-23 – 2017-04-24 (×3): 2 via ORAL
  Filled 2017-04-22 (×3): qty 2

## 2017-04-22 MED ORDER — PROPOFOL 10 MG/ML IV BOLUS
INTRAVENOUS | Status: AC
  Filled 2017-04-22: qty 60

## 2017-04-22 MED ORDER — PRENATAL MULTIVITAMIN CH
1.0000 | ORAL_TABLET | Freq: Every day | ORAL | Status: DC
  Administered 2017-04-23 – 2017-04-25 (×3): 1 via ORAL
  Filled 2017-04-22 (×3): qty 1

## 2017-04-22 MED ORDER — DEXAMETHASONE SODIUM PHOSPHATE 4 MG/ML IJ SOLN
INTRAMUSCULAR | Status: AC
  Filled 2017-04-22: qty 1

## 2017-04-22 MED ORDER — EPHEDRINE SULFATE 50 MG/ML IJ SOLN
INTRAMUSCULAR | Status: DC | PRN
  Administered 2017-04-22: 5 mg via INTRAVENOUS

## 2017-04-22 MED ORDER — ONDANSETRON HCL 4 MG/2ML IJ SOLN
INTRAMUSCULAR | Status: AC
  Filled 2017-04-22: qty 2

## 2017-04-22 MED ORDER — MENTHOL 3 MG MT LOZG: 1.0000 | LOZENGE | OROMUCOSAL | Status: DC | PRN

## 2017-04-22 MED ORDER — SODIUM CHLORIDE 0.9 % IR SOLN
Status: DC | PRN
  Administered 2017-04-22: 1

## 2017-04-22 MED ORDER — DIBUCAINE 1 % RE OINT: 1.0000 "application " | TOPICAL_OINTMENT | RECTAL | Status: DC | PRN

## 2017-04-22 MED ORDER — NALOXONE HCL 0.4 MG/ML IJ SOLN: 0.4000 mg | INTRAMUSCULAR | Status: DC | PRN

## 2017-04-22 MED ORDER — DEXAMETHASONE SODIUM PHOSPHATE 4 MG/ML IJ SOLN
INTRAMUSCULAR | Status: DC | PRN
  Administered 2017-04-22: 10 mg via INTRAVENOUS

## 2017-04-22 MED ORDER — HYDROMORPHONE HCL 1 MG/ML IJ SOLN
0.2500 mg | INTRAMUSCULAR | Status: DC | PRN
  Administered 2017-04-22: 0.5 mg via INTRAVENOUS

## 2017-04-22 MED ORDER — ENOXAPARIN SODIUM 40 MG/0.4ML ~~LOC~~ SOLN
40.0000 mg | SUBCUTANEOUS | Status: DC
  Administered 2017-04-23 – 2017-04-25 (×3): 40 mg via SUBCUTANEOUS
  Filled 2017-04-22 (×3): qty 0.4

## 2017-04-22 MED ORDER — OXYCODONE HCL 5 MG PO TABS
5.0000 mg | ORAL_TABLET | ORAL | Status: DC | PRN
  Administered 2017-04-23 – 2017-04-25 (×8): 5 mg via ORAL
  Filled 2017-04-22 (×6): qty 1

## 2017-04-22 MED ORDER — NALOXONE HCL 4 MG/10ML IJ SOLN
1.0000 ug/kg/h | INTRAVENOUS | Status: DC | PRN
  Filled 2017-04-22: qty 5

## 2017-04-22 MED ORDER — BUPIVACAINE IN DEXTROSE 0.75-8.25 % IT SOLN
INTRATHECAL | Status: DC | PRN
  Administered 2017-04-22: 1.6 mL via INTRATHECAL

## 2017-04-22 MED ORDER — OXYCODONE HCL 5 MG/5ML PO SOLN: 5.0000 mg | Freq: Once | ORAL | Status: DC | PRN

## 2017-04-22 MED ORDER — WITCH HAZEL-GLYCERIN EX PADS: 1.0000 "application " | MEDICATED_PAD | CUTANEOUS | Status: DC | PRN

## 2017-04-22 MED ORDER — BUPIVACAINE HCL (PF) 0.5 % IJ SOLN
INTRAMUSCULAR | Status: AC
  Filled 2017-04-22: qty 30

## 2017-04-22 MED ORDER — PROMETHAZINE HCL 25 MG/ML IJ SOLN: 6.2500 mg | INTRAMUSCULAR | Status: DC | PRN

## 2017-04-22 MED ORDER — ONDANSETRON HCL 4 MG/2ML IJ SOLN
INTRAMUSCULAR | Status: DC | PRN
  Administered 2017-04-22: 4 mg via INTRAVENOUS

## 2017-04-22 MED ORDER — MORPHINE SULFATE (PF) 0.5 MG/ML IJ SOLN
INTRAMUSCULAR | Status: DC | PRN
  Administered 2017-04-22: .2 ug via INTRATHECAL

## 2017-04-22 MED ORDER — ONDANSETRON HCL 4 MG/2ML IJ SOLN: 4.0000 mg | Freq: Three times a day (TID) | INTRAMUSCULAR | Status: DC | PRN

## 2017-04-22 MED ORDER — OXYTOCIN 10 UNIT/ML IJ SOLN
INTRAMUSCULAR | Status: DC | PRN
  Administered 2017-04-22: 40 [IU] via INTRAVENOUS

## 2017-04-22 MED ORDER — DIPHENHYDRAMINE HCL 25 MG PO CAPS
25.0000 mg | ORAL_CAPSULE | ORAL | Status: DC | PRN
  Filled 2017-04-22 (×2): qty 1

## 2017-04-22 MED ORDER — MORPHINE SULFATE (PF) 0.5 MG/ML IJ SOLN
INTRAMUSCULAR | Status: AC
  Filled 2017-04-22: qty 10

## 2017-04-22 MED ORDER — OXYTOCIN 10 UNIT/ML IJ SOLN
INTRAMUSCULAR | Status: AC
  Filled 2017-04-22: qty 4

## 2017-04-22 MED ORDER — DIPHENHYDRAMINE HCL 50 MG/ML IJ SOLN
12.5000 mg | INTRAMUSCULAR | Status: DC | PRN
  Administered 2017-04-22: 12.5 mg via INTRAVENOUS

## 2017-04-22 MED ORDER — MEPERIDINE HCL 25 MG/ML IJ SOLN
INTRAMUSCULAR | Status: DC | PRN
  Administered 2017-04-22: 12.5 mg via INTRAVENOUS

## 2017-04-22 MED ORDER — OXYCODONE HCL 5 MG PO TABS: 5.0000 mg | ORAL_TABLET | Freq: Once | ORAL | Status: DC | PRN

## 2017-04-22 MED ORDER — BUPIVACAINE IN DEXTROSE 0.75-8.25 % IT SOLN
INTRATHECAL | Status: AC
  Filled 2017-04-22: qty 2

## 2017-04-22 MED ORDER — PHENYLEPHRINE 8 MG IN D5W 100 ML (0.08MG/ML) PREMIX OPTIME
INJECTION | INTRAVENOUS | Status: DC | PRN
  Administered 2017-04-22: 60 ug/min via INTRAVENOUS

## 2017-04-22 MED ORDER — MEPERIDINE HCL 25 MG/ML IJ SOLN
INTRAMUSCULAR | Status: AC
  Filled 2017-04-22: qty 1

## 2017-04-22 MED ORDER — SCOPOLAMINE 1 MG/3DAYS TD PT72: 1.0000 | MEDICATED_PATCH | Freq: Once | TRANSDERMAL | Status: DC

## 2017-04-22 MED ORDER — KETOROLAC TROMETHAMINE 30 MG/ML IJ SOLN
INTRAMUSCULAR | Status: AC
  Filled 2017-04-22: qty 1

## 2017-04-22 MED ORDER — KETOROLAC TROMETHAMINE 30 MG/ML IJ SOLN
30.0000 mg | Freq: Once | INTRAMUSCULAR | Status: AC | PRN
  Administered 2017-04-22: 30 mg via INTRAVENOUS

## 2017-04-22 MED ORDER — SIMETHICONE 80 MG PO CHEW
80.0000 mg | CHEWABLE_TABLET | ORAL | Status: DC | PRN
  Administered 2017-04-23: 80 mg via ORAL

## 2017-04-22 MED ORDER — PHENYLEPHRINE 40 MCG/ML (10ML) SYRINGE FOR IV PUSH (FOR BLOOD PRESSURE SUPPORT)
PREFILLED_SYRINGE | INTRAVENOUS | Status: AC
  Filled 2017-04-22: qty 10

## 2017-04-22 SURGICAL SUPPLY — 35 items
BENZOIN TINCTURE PRP APPL 2/3 (GAUZE/BANDAGES/DRESSINGS) ×2 IMPLANT
CHLORAPREP W/TINT 26ML (MISCELLANEOUS) ×2 IMPLANT
CLAMP CORD UMBIL (MISCELLANEOUS) IMPLANT
CLOTH BEACON ORANGE TIMEOUT ST (SAFETY) ×2 IMPLANT
DRAIN JACKSON PRT FLT 7MM (DRAIN) IMPLANT
DRSG OPSITE POSTOP 4X10 (GAUZE/BANDAGES/DRESSINGS) ×2 IMPLANT
ELECT REM PT RETURN 9FT ADLT (ELECTROSURGICAL) ×2
ELECTRODE REM PT RTRN 9FT ADLT (ELECTROSURGICAL) ×1 IMPLANT
EVACUATOR SILICONE 100CC (DRAIN) IMPLANT
EXTRACTOR VACUUM M CUP 4 TUBE (SUCTIONS) IMPLANT
GAUZE SPONGE 4X4 12PLY STRL LF (GAUZE/BANDAGES/DRESSINGS) ×4 IMPLANT
GLOVE BIO SURGEON STRL SZ7 (GLOVE) ×2 IMPLANT
GLOVE BIOGEL PI IND STRL 7.0 (GLOVE) ×2 IMPLANT
GLOVE BIOGEL PI INDICATOR 7.0 (GLOVE) ×2
GOWN STRL REUS W/TWL LRG LVL3 (GOWN DISPOSABLE) ×4 IMPLANT
KIT ABG SYR 3ML LUER SLIP (SYRINGE) IMPLANT
NEEDLE HYPO 22GX1.5 SAFETY (NEEDLE) ×2 IMPLANT
NEEDLE HYPO 25X5/8 SAFETYGLIDE (NEEDLE) ×2 IMPLANT
NS IRRIG 1000ML POUR BTL (IV SOLUTION) ×2 IMPLANT
PACK C SECTION WH (CUSTOM PROCEDURE TRAY) ×2 IMPLANT
PAD ABD 7.5X8 STRL (GAUZE/BANDAGES/DRESSINGS) ×2 IMPLANT
PAD OB MATERNITY 4.3X12.25 (PERSONAL CARE ITEMS) ×2 IMPLANT
PENCIL SMOKE EVAC W/HOLSTER (ELECTROSURGICAL) ×2 IMPLANT
RTRCTR C-SECT PINK 25CM LRG (MISCELLANEOUS) ×2 IMPLANT
STRIP CLOSURE SKIN 1/2X4 (GAUZE/BANDAGES/DRESSINGS) ×2 IMPLANT
SUT CHROMIC 2 0 CT 1 (SUTURE) ×2 IMPLANT
SUT MON AB 2-0 CT1 27 (SUTURE) ×2 IMPLANT
SUT VIC AB 0 CTX 36 (SUTURE) ×5
SUT VIC AB 0 CTX36XBRD ANBCTRL (SUTURE) ×5 IMPLANT
SUT VIC AB 2-0 CT1 (SUTURE) ×2 IMPLANT
SUT VIC AB 4-0 KS 27 (SUTURE) ×2 IMPLANT
SYR CONTROL 10ML LL (SYRINGE) ×2 IMPLANT
SYR KIT LINE DRAW 1CC W/FILTR (LINER) ×2 IMPLANT
TOWEL OR 17X24 6PK STRL BLUE (TOWEL DISPOSABLE) ×2 IMPLANT
TRAY FOLEY W/BAG SLVR 14FR LF (SET/KITS/TRAYS/PACK) ×2 IMPLANT

## 2017-04-22 NOTE — Anesthesia Procedure Notes (Signed)
Spinal  Patient location during procedure: OB Start time: 04/22/2017 1:18 PM End time: 04/22/2017 1:23 PM Staffing Anesthesiologist: Lowella CurbMiller, Monserath Neff Ray, MD Performed: anesthesiologist  Preanesthetic Checklist Completed: patient identified, surgical consent, pre-op evaluation, timeout performed, IV checked, risks and benefits discussed and monitors and equipment checked Spinal Block Patient position: sitting Prep: site prepped and draped and DuraPrep Patient monitoring: heart rate, cardiac monitor, continuous pulse ox and blood pressure Approach: midline Location: L3-4 Injection technique: single-shot Needle Needle type: Pencan  Needle gauge: 24 G Needle length: 10 cm Assessment Sensory level: T4

## 2017-04-22 NOTE — Op Note (Signed)
Brianna Palmer PROCEDURE DATE: 04/22/2017    PREOPERATIVE DIAGNOSIS: Intrauterine pregnancy at  1763w2d weeks gestation, elective repeat cesarean  POSTOPERATIVE DIAGNOSIS: The same  PROCEDURE:   Low Transverse Cesarean Section  SURGEON:  Dr. Elsie LincolnKelly Leggett and Dr. Caryl AdaJazma Phelps  ASSISTANT:   INDICATIONS: Brianna Palmer is a 32 y.o. [id number] at 5563w2d  Here for repeat cesarean section.  The risks of cesarean section discussed with the patient included but were not limited to: bleeding which may require transfusion or reoperation; infection which may require antibiotics; injury to bowel, bladder, ureters or other surrounding organs; injury to the fetus; need for additional procedures including hysterectomy in the event of a life-threatening hemorrhage; placental abnormalities wth subsequent pregnancies, incisional problems, thromboembolic phenomenon and other postoperative/anesthesia complications. The patient concurred with the proposed plan, giving informed written consent for the procedure.    FINDINGS:  Viable female  infant in cephalic presentation, 9,9 Apgars, weight to be determined in 1 hour, light meconium amniotic fluid.  Intact placenta, three vessel cord.  Grossly normal uterus, ovaries and fallopian tubes. .   ANESTHESIA: Spinal  ESTIMATED BLOOD LOSS: 429mL  SPECIMENS: Placenta sent to L&D  COMPLICATIONS: None immediate  PROCEDURE IN DETAIL:  The patient received intravenous antibiotics and had sequential compression devices applied to her lower extremities.  Spinal anesthesia was administered and was found to be adequate. She was then placed in a dorsal supine position with a leftward tilt, and prepped and draped in a sterile manner.  A foley catheter was placed into her bladder and attached to constant gravity.  After an adequate timeout was performed, a Pfannenstiel skin incision was made over old incision with scalpel and carried through to the underlying layer of fascia. The fascia was  incised in the midline and this incision was extended bilaterally using the Mayo scissors. Kocher clamps were applied to the superior aspect of the fascial incision and the underlying rectus muscles were dissected off with bovi due to dense adhesions. A similar process was carried out on the inferior aspect of the facial incision. The rectus muscles were separated in the midline bluntly and the peritoneum was entered bluntly. Rectus muscles noted to be limiting visualization of uterus so modified Maylard performed on right rectus muscle. Alexis inserted. A transverse hysterotomy was made with a scalpel and extended bilaterally bluntly. The infant was successfully delivered, and cord was clamped and cut and infant was handed over to awaiting neonatology team. Uterine massage was then administered and the placenta delivered intact with three-vessel cord. The uterus was cleared of clot and debris.  The hysterotomy was closed with 0 vicryl.  A second imbricating suture of 0-Vicryl was used to reinforce the incision and aid in hemostasis. The peritoneum and rectus muscles were noted to be hemostatic.  The fascia was closed with 0-Vicryl in a running fashion with good restoration of anatomy.  The subcutaneus tissue was copiously irrigated. Marland Kitchen.5% Marcaine used at the incision site for local analgesia. The skin was closed with 4-0 Vicryl in a subcuticular fashion.    Pt tolerated the procedure will.  All counts were correct x2.  Pt went to the recovery room in stable condition.  Caryl AdaJazma Phelps, DO OB Fellow Center for [facility], [facility]

## 2017-04-22 NOTE — Anesthesia Postprocedure Evaluation (Signed)
Anesthesia Post Note  Patient: Brianna Palmer  Procedure(s) Performed: CESAREAN SECTION (N/A )     Patient location during evaluation: PACU Anesthesia Type: Spinal Level of consciousness: oriented and awake and alert Pain management: pain level controlled Vital Signs Assessment: post-procedure vital signs reviewed and stable Respiratory status: spontaneous breathing and respiratory function stable Cardiovascular status: blood pressure returned to baseline and stable Postop Assessment: no headache, no backache and no apparent nausea or vomiting Anesthetic complications: no    Last Vitals:  Vitals:   04/22/17 1545 04/22/17 1600  BP: 114/73 117/68  Pulse: 78 76  Resp: (!) 22   Temp:    SpO2: 100% 99%    Last Pain:  Vitals:   04/22/17 1545  TempSrc:   PainSc: 6    Pain Goal: Patients Stated Pain Goal: 4 (04/22/17 1213)               Lowella CurbWarren Ray Nakota Elsen

## 2017-04-22 NOTE — H&P (Signed)
Obstetric Preoperative History and Physical  Brianna BorgRenee Palmer is a 32 y.o. [id number] with IUP at 2296w2d presenting for scheduled cesarean section.  Patient declines TOLAC. No acute concerns.   Prenatal Course Source of Care: [facility] Pregnancy complications or risks: Patient Active Problem List   Diagnosis Date Noted  . Nausea and vomiting during pregnancy 01/06/2017  . Headache in pregnancy 12/24/2016  . Migraine without aura and with status migrainosus, not intractable 11/05/2016  . Muscle spasm 11/05/2016  . Herpes genitalis in women 09/28/2016  . Previous cesarean delivery affecting pregnancy 09/28/2016  . Supervision of other normal pregnancy, antepartum 09/16/2016  . Depressive disorder, not elsewhere classified 06/08/2012   She plans to breastfeed She desires IUD for postpartum contraception.   Prenatal labs and studies: ABO, Rh: B/Positive/-- (09/06 0933) Antibody: Negative (09/06 0933) Rubella: 4.08 (09/06 0933) RPR: Non Reactive (12/31 0923)  HBsAg: Negative (09/06 0933)  HIV: Non Reactive (12/31 0923)  GBS: Negative 2 hr Glucola  Normal Genetic screening normal Anatomy US normal  Prenatal Transfer Tool  Maternal Diabetes: No Genetic Screening: Normal Maternal Ultrasounds/Referrals: Normal Fetal Ultrasounds or other Referrals:  None Maternal Substance Abuse:  No Significant Maternal Medications:  None Significant Maternal Lab Results: None  Past Medical History:  Diagnosis Date  . Headache   . HSV infection     Past Surgical History:  Procedure Laterality Date  . BUNIONECTOMY    . CESAREAN SECTION N/A 03/30/2012   Procedure: CESAREAN SECTION;  Surgeon: Antionette CharLisa Jackson-Moore, MD;  Location: [facility];  Service: Obstetrics;  Laterality: N/A;  Primary Cesarean Section Delivery Baby Boy @ 0157, Apgars 8/9    OB History  Gravida Para Term Preterm AB Living  5 1 1   3 1   SAB TAB Ectopic Multiple Live Births  2 1     1     # Outcome Date GA Lbr Len/2nd Weight Sex  Delivery Anes PTL Lv  5 Current           4 Term 03/30/12 9124w6d  6 lb 3.8 oz (2.83 kg) M CS-LVertical EPI  LIV  3 SAB 2012          2 SAB 2012 4471w0d         1 TAB 2007            Social History   Socioeconomic History  . Marital status: Divorced    Spouse name: Not on file  . Number of children: 1  . Years of education: Not on file  . Highest education level: Not on file  Occupational History    Employer: [employer]  Social Needs  . Financial resource strain: Not on file  . Food insecurity:    Worry: Not on file    Inability: Not on file  . Transportation needs:    Medical: Not on file    Non-medical: Not on file  Tobacco Use  . Smoking status: Former Smoker    Packs/day: 0.25    Types: Cigarettes    Last attempt to quit: 07/01/2013    Years since quitting: 3.8  . Smokeless tobacco: Never Used  Substance and Sexual Activity  . Alcohol use: No    Alcohol/week: 0.0 oz    Comment: socially  . Drug use: No  . Sexual activity: Yes    Partners: Male  Lifestyle  . Physical activity:    Days per week: Not on file    Minutes per session: Not on file  . Stress: Not on file  Relationships  . Social connections:    Talks on phone: Not on file    Gets together: Not on file    Attends religious service: Not on file    Active member of club or organization: Not on file    Attends meetings of clubs or organizations: Not on file    Relationship status: Not on file  Other Topics Concern  . Not on file  Social History Narrative  . Not on file    Family History  Adopted: Yes  Problem Relation Age of Onset  . Other Neg Hx   . Cancer Neg Hx   . Hypertension Neg Hx   . Stroke Neg Hx     Medications Prior to Admission  Medication Sig Dispense Refill Last Dose  . acetaminophen (TYLENOL) 325 MG tablet Take 650 mg by mouth every 6 (six) hours as needed for mild pain or headache.   Not Taking  . Polyethyl Glycol-Propyl Glycol (SYSTANE) 0.4-0.3 % SOLN Place 1 drop into  both eyes daily.   Not Taking  . valACYclovir (VALTREX) 500 MG tablet Take two tablets by mouth twice daily for ten days; then one tablet twice daily for remainder of pregnancy 180 tablet 5 Taking    No Known Allergies  Review of Systems: Pertinent items noted in HPI and remainder of comprehensive ROS otherwise negative.  Physical Exam: BP 118/79 (BP Location: Left Arm)   Pulse 86   Temp 98.3 F (36.8 C) (Oral)   Resp 20   Ht 5\' 3"  (1.6 m)   Wt 190 lb 1.6 oz (86.2 kg)   LMP 07/21/2016 (Exact Date)   BMI 33.67 kg/m  FHR by Doppler: 140 bpm CONSTITUTIONAL: Well-developed, well-nourished female in no acute distress.  HENT:  Normocephalic, atraumatic, External right and left ear normal. Oropharynx is clear and moist EYES: Conjunctivae and EOM are normal. Pupils are equal, round, and reactive to light. No scleral icterus.  NECK: Normal range of motion, supple, no masses SKIN: Skin is warm and dry. No rash noted. Not diaphoretic. No erythema. No pallor. NEUROLGIC: Alert and oriented to person, place, and time. Normal reflexes, muscle tone coordination. No cranial nerve deficit noted. PSYCHIATRIC: Normal mood and affect. Normal behavior. Normal judgment and thought content. CARDIOVASCULAR: Normal heart rate noted, regular rhythm RESPIRATORY: Effort and breath sounds normal, no problems with respiration noted ABDOMEN: Soft, nontender, nondistended, gravid. Well-healed Pfannenstiel incision. PELVIC: Deferred MUSCULOSKELETAL: Normal range of motion. No edema and no tenderness. 2+ distal pulses.   Pertinent Labs/Studies:   Results for orders placed or performed during the hospital encounter of 04/22/17 (from the past 72 hour(s))  CBC     Status: Abnormal   Collection Time: 04/22/17 12:09 PM  Result Value Ref Range   WBC 8.4 4.0 - 10.5 K/uL   RBC 4.42 3.87 - 5.11 MIL/uL   Hemoglobin 12.4 12.0 - 15.0 g/dL   HCT 69.6 (L) 29.5 - 28.4 %   MCV 80.8 78.0 - 100.0 fL   MCH 28.1 26.0 - 34.0 pg    MCHC 34.7 30.0 - 36.0 g/dL   RDW 13.2 44.0 - 10.2 %   Platelets 208 150 - 400 K/uL    Comment: Performed at [facility], [address], [place], [place] [address]    Assessment and Plan :Brianna Palmer is a 32 y.o. [phone] at [redacted]w[redacted]d being admitted for scheduled cesarean section. The risks of cesarean section discussed with the patient included but were not limited to: bleeding which may require  transfusion or reoperation; infection which may require antibiotics; injury to bowel, bladder, ureters or other surrounding organs; injury to the fetus; need for additional procedures including hysterectomy in the event of a life-threatening hemorrhage; placental abnormalities wth subsequent pregnancies, incisional problems, thromboembolic phenomenon and other postoperative/anesthesia complications. The patient concurred with the proposed plan, giving informed written consent for the procedure. Patient has been NPO since last night she will remain NPO for procedure. Anesthesia and OR aware. Preoperative prophylactic antibiotics and SCDs ordered on call to the OR. To OR when ready.    Caryl Ada, DO OB Fellow Center for [facility], [facility]

## 2017-04-22 NOTE — Anesthesia Postprocedure Evaluation (Signed)
Anesthesia Post Note  Patient: Brianna Palmer  Procedure(s) Performed: CESAREAN SECTION (N/A )     Patient location during evaluation: Mother Baby Anesthesia Type: Spinal Level of consciousness: awake and alert and oriented Pain management: satisfactory to patient Vital Signs Assessment: post-procedure vital signs reviewed and stable Respiratory status: respiratory function stable and spontaneous breathing Cardiovascular status: blood pressure returned to baseline Postop Assessment: no headache, no backache, spinal receding, patient able to bend at knees and adequate PO intake Anesthetic complications: no    Last Vitals:  Vitals:   04/22/17 1730 04/22/17 1830  BP: 112/73 115/81  Pulse: 70 78  Resp: 18 20  Temp: 36.8 C 36.7 C  SpO2: 100% 100%    Last Pain:  Vitals:   04/22/17 1830  TempSrc: Oral  PainSc:    Pain Goal: Patients Stated Pain Goal: 4 (04/22/17 1621)               Serrina Minogue

## 2017-04-22 NOTE — Transfer of Care (Signed)
Immediate Anesthesia Transfer of Care Note  Patient: Brianna Palmer  Procedure(s) Performed: CESAREAN SECTION (N/A )  Patient Location: PACU  Anesthesia Type:Spinal  Level of Consciousness: awake, alert  and oriented  Airway & Oxygen Therapy: Patient Spontanous Breathing  Post-op Assessment: Report given to RN and Post -op Vital signs reviewed and stable  Post vital signs: Reviewed and stable   HR 86 BP 113/72 Sats 100% Resp 14  Last Vitals:  Vitals Value Taken Time  BP 113/72 04/22/2017  3:00 PM  Temp    Pulse 87 04/22/2017  3:02 PM  Resp 10 04/22/2017  3:02 PM  SpO2 100 % 04/22/2017  3:02 PM  Vitals shown include unvalidated device data.  Last Pain:  Vitals:   04/22/17 1213  TempSrc: Oral  PainSc: 0-No pain      Patients Stated Pain Goal: 4 (04/22/17 1213)  Complications: No apparent anesthesia complications

## 2017-04-22 NOTE — Addendum Note (Signed)
Addendum  created 04/22/17 1848 by Graciela HusbandsFussell, Deryl Giroux O, CRNA   Sign clinical note

## 2017-04-22 NOTE — Anesthesia Preprocedure Evaluation (Signed)
Anesthesia Evaluation  Patient identified by MRN, date of birth, ID band Patient awake    Reviewed: Allergy & Precautions, H&P , NPO status , Patient's Chart, lab work & pertinent test results  History of Anesthesia Complications (+) PONV  Airway Mallampati: II  TM Distance: >3 FB Neck ROM: full    Dental no notable dental hx.    Pulmonary neg pulmonary ROS, former smoker,    Pulmonary exam normal breath sounds clear to auscultation       Cardiovascular negative cardio ROS Normal cardiovascular exam Rhythm:Regular Rate:Normal     Neuro/Psych negative neurological ROS  negative psych ROS   GI/Hepatic negative GI ROS, Neg liver ROS,   Endo/Other  negative endocrine ROS  Renal/GU negative Renal ROS  negative genitourinary   Musculoskeletal negative musculoskeletal ROS (+)   Abdominal Normal abdominal exam  (+)   Peds negative pediatric ROS (+)  Hematology negative hematology ROS (+)   Anesthesia Other Findings   Reproductive/Obstetrics (+) Pregnancy                             Anesthesia Physical  Anesthesia Plan  ASA: II  Anesthesia Plan: Spinal   Post-op Pain Management:    Induction:   PONV Risk Score and Plan: Treatment may vary due to age or medical condition  Airway Management Planned: Natural Airway  Additional Equipment:   Intra-op Plan:   Post-operative Plan:   Informed Consent: I have reviewed the patients History and Physical, chart, labs and discussed the procedure including the risks, benefits and alternatives for the proposed anesthesia with the patient or authorized representative who has indicated his/her understanding and acceptance.     Plan Discussed with:   Anesthesia Plan Comments:         Anesthesia Quick Evaluation

## 2017-04-23 LAB — RPR: RPR: NONREACTIVE

## 2017-04-23 LAB — CBC
HEMATOCRIT: 29.4 % — AB (ref 36.0–46.0)
Hemoglobin: 10.1 g/dL — ABNORMAL LOW (ref 12.0–15.0)
MCH: 27.7 pg (ref 26.0–34.0)
MCHC: 34.4 g/dL (ref 30.0–36.0)
MCV: 80.8 fL (ref 78.0–100.0)
Platelets: 201 10*3/uL (ref 150–400)
RBC: 3.64 MIL/uL — ABNORMAL LOW (ref 3.87–5.11)
RDW: 15.4 % (ref 11.5–15.5)
WBC: 15.5 10*3/uL — ABNORMAL HIGH (ref 4.0–10.5)

## 2017-04-23 NOTE — Lactation Note (Signed)
This note was copied from a baby's chart. Lactation Consultation Note  Patient Name: [name] ZOXWR'UToday's Date: 04/23/2017 Reason for consult: Initial assessment;Term Baby is 5719 hours old.  Mom has given formula once because she is worried baby isn't getting enough.  Observed baby actively feeding with few swallows.  Reassured mom and reviewed basics.  Answered questions.  Instructed to hold baby closer and use breast massage during feeding.  Instructed to feed with cues and call for assist prn.  Maternal Data Has patient been taught Hand Expression?: Yes Does the patient have breastfeeding experience prior to this delivery?: Yes  Feeding Feeding Type: Breast Fed  LATCH Score Latch: Grasps breast easily, tongue down, lips flanged, rhythmical sucking.  Audible Swallowing: A few with stimulation  Type of Nipple: Everted at rest and after stimulation  Comfort (Breast/Nipple): Soft / non-tender  Hold (Positioning): No assistance needed to correctly position infant at breast.  LATCH Score: 9  Interventions Interventions: Breast feeding basics reviewed  Lactation Tools Discussed/Used     Consult Status Consult Status: Follow-up Date: 04/24/17 Follow-up type: In-patient    Huston FoleyMOULDEN, Jacob Cicero S 04/23/2017, 9:36 AM

## 2017-04-23 NOTE — Progress Notes (Signed)
Mother of baby was referred for history of depression and anxiety. Referral screened out by CSW because per chart reivew, patient's diagnosis of depressive disorder first appeared in her chart in 2014. Patient has no documented episodes or symptoms of depression, including no postpartum depression in prior birth. [facility]. Patient's current EDPS is 3.    Please contact CSW if mother of baby requests, if needs arise, or if mother of baby scores greater than a nine or answers yes to question ten on [place] Postpartum Depression Screen.  Edwin Dadaarol Casimir Barcellos, MSW, LCSW-A Clinical Social Worker [facility] [phone]

## 2017-04-23 NOTE — Progress Notes (Signed)
Subjective: Postpartum Day 1: Cesarean Delivery Patient reports difficulty breast feeding.  Objective: Vital signs in last 24 hours: Temp:  [97.7 F (36.5 C)-98.8 F (37.1 C)] 98.3 F (36.8 C) (04/13 0541) Pulse Rate:  [61-86] 83 (04/13 0541) Resp:  [11-22] 18 (04/13 0541) BP: (101-118)/(61-82) 107/61 (04/13 0541) SpO2:  [92 %-100 %] 95 % (04/13 0541) Weight:  [190 lb 1.6 oz (86.2 kg)] 190 lb 1.6 oz (86.2 kg) (04/12 1213)  Physical Exam:  General: alert and no distress Lochia: appropriate Uterine Fundus: firm Incision: healing well, no significant drainage, no dehiscence, no significant erythema DVT Evaluation: No evidence of DVT seen on physical exam.  Recent Labs    04/22/17 1209 04/23/17 0515  HGB 12.4 10.1*  HCT 35.7* 29.4*    Assessment/Plan: Status post Cesarean section. Doing well postoperatively.   Continue current care.  Wendee BeaversDavid J Jomarie Gellis 04/23/2017, 7:13 AM

## 2017-04-24 NOTE — Lactation Note (Signed)
This note was copied from a baby's chart. Lactation Consultation Note  Patient Name: Boy Glenetta BorgRenee Hinkle UJWJX'BToday's Date: 04/24/2017 Reason for consult: Follow-up assessment;Infant weight loss Baby cluster fed all night and today.  Mom exhausted and discouraged.  Baby acting frantic hungry.  Good latch observed earlier today although baby pulling off some.  Symphony pump set up and initiated to stimulate supply and possibly provide extra calories for baby.  Mom pumped drops which were given to baby.  Mom would like to supplement with formula.  Baby was syringe fed 15 mls with curved tip syringe.  Baby tolerated well and content after feeding.  Mom praised for all her efforts.  Encouraged her to take a nap.  Maternal Data    Feeding Feeding Type: Formula Length of feed: 60 min(on and off)  LATCH Score                   Interventions    Lactation Tools Discussed/Used Pump Review: Setup, frequency, and cleaning;Milk Storage Initiated by:: LM Date initiated:: 04/24/17   Consult Status Consult Status: Follow-up Date: 04/25/17 Follow-up type: In-patient    Huston FoleyMOULDEN, Braylon Lemmons S 04/24/2017, 3:16 PM

## 2017-04-24 NOTE — Progress Notes (Signed)
Subjective: Postpartum Day 2: Cesarean Delivery Patient reports incisional pain, tolerating PO, + flatus and no problems voiding.    Objective: Vital signs in last 24 hours: Temp:  [98 F (36.7 C)-98.9 F (37.2 C)] 98 F (36.7 C) (04/14 0529) Pulse Rate:  [63-96] 63 (04/14 0606) Resp:  [18] 18 (04/14 0529) BP: (100-111)/(49-70) 111/70 (04/14 0606) SpO2:  [100 %] 100 % (04/13 1806)  Physical Exam:  General: alert, cooperative, appears stated age and no distress Lochia: appropriate Uterine Fundus: firm Incision: healing well, no significant drainage, no dehiscence, no significant erythema DVT Evaluation: No evidence of DVT seen on physical exam.  Recent Labs    04/22/17 1209 04/23/17 0515  HGB 12.4 10.1*  HCT 35.7* 29.4*    Assessment/Plan: Status post Cesarean section. Doing well postoperatively.  Continue current care.  Brianna Palmer 04/24/2017, 9:44 AM

## 2017-04-24 NOTE — Lactation Note (Signed)
This note was copied from a baby's chart. Lactation Consultation Note  Patient Name: Boy Glenetta BorgRenee Worland ZOXWR'UToday's Date: 04/24/2017 Reason for consult: Follow-up assessment;Infant weight loss  NP requested LC to see mom; baby is at 9% weight loss. Previous LC had already worked with mom setting up a DEBP, and supplemented baby with a curved tip syringe; but mom needed more assistance, per NP she was very discouraged with BF and not feeling well. Baby was asleep and swaddled when entering the room, offered assistance with latch but mom declined saying she was too tired and baby is asleep.  Mom already used pump but she stated "nothing has come out yet" explained to mom that at this point she's not supposed to get volume yet, lactogenesis II was discussed. Also, explained to her that breastmilk follows supply an demand, and the more stimulation she gets at the breast, whether is with baby's mouth or with there DEBP the better the changes for her milk to come in sooner. Mom verbalized understanding.  Encouraged mom to keep putting baby at the breast STS and to pump every 3 hours after feedings. Mom will respect feeding cues and will feed baby 8-12 times/24 hours or sooner if feeding cues are present. Will follow formula guidelines for supplementing a BF infant according the volumes discussed. Mom aware of LC services and will call for latch assistance the next time baby is ready to feed.  Maternal Data    Feeding Feeding Type: Formula  Interventions    Lactation Tools Discussed/Used Pump Review: Setup, frequency, and cleaning;Milk Storage Initiated by:: LM Date initiated:: 04/24/17   Consult Status Consult Status: Follow-up Date: 04/25/17 Follow-up type: In-patient    Elliot Simoneaux Venetia ConstableS Reise Gladney 04/24/2017, 6:02 PM

## 2017-04-25 ENCOUNTER — Encounter (HOSPITAL_COMMUNITY): Payer: Self-pay | Admitting: *Deleted

## 2017-04-25 MED ORDER — SENNOSIDES-DOCUSATE SODIUM 8.6-50 MG PO TABS: 2.0000 | ORAL_TABLET | ORAL | 0 refills | Status: DC

## 2017-04-25 MED ORDER — OXYCODONE HCL 5 MG PO TABS: 5.0000 mg | ORAL_TABLET | ORAL | 0 refills | Status: DC | PRN

## 2017-04-25 MED ORDER — IBUPROFEN 600 MG PO TABS: 600.0000 mg | ORAL_TABLET | Freq: Four times a day (QID) | ORAL | 0 refills | Status: DC

## 2017-04-25 NOTE — Lactation Note (Signed)
This note was copied from a baby's chart. Lactation Consultation Note  Patient Name: Brianna Palmer ZOXWR'UToday's Date: 04/25/2017 Reason for consult: Follow-up assessment;Infant weight loss Baby is 70 hours old and at a 11% weight loss.  Mom's breasts are filling.  Assisted with latching baby using SNS with 30 mls of formula.  Baby latched easily.  Instructed to allow baby to stay on and finish supplement.  Mom agreeable.  Encouraged to call out for assist prn.  Maternal Data    Feeding Feeding Type: Formula Length of feed: 30 min  LATCH Score Latch: Grasps breast easily, tongue down, lips flanged, rhythmical sucking.  Audible Swallowing: Spontaneous and intermittent  Type of Nipple: Everted at rest and after stimulation  Comfort (Breast/Nipple): Soft / non-tender  Hold (Positioning): Assistance needed to correctly position infant at breast and maintain latch.  LATCH Score: 9  Interventions    Lactation Tools Discussed/Used Tools: Supplemental Nutrition System   Consult Status Consult Status: Follow-up Date: 04/26/17 Follow-up type: In-patient    Huston FoleyMOULDEN, [name] 04/25/2017, 12:25 PM

## 2017-04-25 NOTE — Addendum Note (Signed)
Addendum  created 04/25/17 0934 by Lowella CurbMiller, Ahmari Duerson Ray, MD   Attestation recorded in Intraprocedure, Intraprocedure Attestations filed

## 2017-04-25 NOTE — Discharge Summary (Signed)
OB Discharge Summary     Patient Name: Brianna BorgRenee Okelly DOB: Jan 24, 1985 MRN: [id number]  Date of admission: 04/22/2017 Delivering MD: Elsie LincolnLEGGETT, KELLY H   Date of discharge: 04/25/2017  Admitting diagnosis: RCS Intrauterine pregnancy: 2659w2d     Secondary diagnosis:  Active Problems:   Status post repeat low transverse cesarean section  Additional problems: none     Discharge diagnosis: Term Pregnancy Delivered                                                                                                Post partum procedures:none   Complications: None  Hospital course:  Sceduled C/S   32 y.o. yo [id number] at 7159w2d was admitted to the hospital 04/22/2017 for scheduled cesarean section with the following indication:Elective Repeat.  Membrane Rupture Time/Date: 1:59 PM ,04/22/2017   Patient delivered a Viable infant.04/22/2017  Details of operation can be found in separate operative note.  Pateint had an uncomplicated postpartum course.  She is ambulating, tolerating a regular diet, passing flatus, and urinating well. Patient is discharged home in stable condition on  04/25/17         Physical exam  Vitals:   04/24/17 0529 04/24/17 0606 04/24/17 1745 04/25/17 0600  BP:  111/70 115/75 108/79  Pulse: 83 63 79 89  Resp: 18  18 (!) 22  Temp: 98 F (36.7 C)  98.1 F (36.7 C) 97.8 F (36.6 C)  TempSrc: Oral  Oral Oral  SpO2:    100%  Weight:      Height:       General: alert, cooperative and no distress Lochia: appropriate Uterine Fundus: firm Incision: Healing well with no significant drainage, No significant erythema, Dressing is clean, dry, and intact DVT Evaluation: No evidence of DVT seen on physical exam. Negative Homan's sign. No cords or calf tenderness. No significant calf/ankle edema. Labs: Lab Results  Component Value Date   WBC 15.5 (H) 04/23/2017   HGB 10.1 (L) 04/23/2017   HCT 29.4 (L) 04/23/2017   MCV 80.8 04/23/2017   PLT 201 04/23/2017   CMP Latest Ref  Rng & Units 04/22/2017  Glucose 65 - 99 mg/dL -  BUN 6 - 20 mg/dL -  Creatinine 6.440.44 - 0.341.00 mg/dL 7.420.66  Sodium 595135 - 638145 mmol/L -  Potassium 3.5 - 5.1 mmol/L -  Chloride 101 - 111 mmol/L -  CO2 22 - 32 mmol/L -  Calcium 8.9 - 10.3 mg/dL -  Total Protein 6.5 - 8.1 g/dL -  Total Bilirubin 0.3 - 1.2 mg/dL -  Alkaline Phos 38 - 756126 U/L -  AST 15 - 41 U/L -  ALT 14 - 54 U/L -    Discharge instruction: per After Visit Summary and "Baby and Me Booklet".  After visit meds:  Allergies as of 04/25/2017   No Known Allergies     Medication List    STOP taking these medications   acetaminophen 325 MG tablet Commonly known as:  TYLENOL   SYSTANE 0.4-0.3 % Soln Generic drug:  Polyethyl Glycol-Propyl Glycol   valACYclovir 500 MG tablet Commonly known as:  VALTREX     TAKE these medications   ibuprofen 600 MG tablet Commonly known as:  ADVIL,MOTRIN Take 1 tablet (600 mg total) by mouth every 6 (six) hours.   oxyCODONE 5 MG immediate release tablet Commonly known as:  Oxy IR/ROXICODONE Take 1 tablet (5 mg total) by mouth every 4 (four) hours as needed (pain scale 4-7).   senna-docusate 8.6-50 MG tablet Commonly known as:  Senokot-S Take 2 tablets by mouth daily. Start taking on:  04/26/2017       Diet: routine diet  Activity: Advance as tolerated. Pelvic rest for 6 weeks.   Outpatient follow up:2 weeks Follow up Appt: Future Appointments  Date Time Provider Department Center  05/09/2017  9:15 AM Levie Heritage, DO [facility] None   Follow up Visit:No follow-ups on file.  Postpartum contraception: IUD Paragard  Newborn Data: Live born female  Birth Weight: 7 lb 7.8 oz (3395 g) APGAR: 9, 9  Newborn Delivery   Birth date/time:  04/22/2017 14:00:00 Delivery type:  C-Section, Low Transverse Trial of labor:  No C-section categorization:  Repeat     Baby Feeding: Breast Disposition:home with mother   04/25/2017 Levie Heritage, DO

## 2017-04-25 NOTE — Discharge Instructions (Signed)

## 2017-04-26 ENCOUNTER — Ambulatory Visit: Payer: Self-pay

## 2017-04-26 NOTE — Lactation Note (Signed)
This note was copied from a baby's chart. Lactation Consultation Note  Patient Name: Brianna Palmer ZOXWR'UToday's Date: 04/26/2017  Baby gained weight.  Now 7% weight loss.  Mom states the SNS was too difficult to use so she is supplementing with formula per bottle. Baby latching well. Mom's breasts are still soft this AM.  Mom will continue to supplement until milk comes in.  Lactation outpatient services and support information reviewed and encouraged prn.   Maternal Data    Feeding    LATCH Score                   Interventions    Lactation Tools Discussed/Used     Consult Status      Huston FoleyMOULDEN, Rowen Hur S 04/26/2017, 9:42 AM

## 2017-04-28 ENCOUNTER — Encounter: Payer: BLUE CROSS/BLUE SHIELD | Admitting: Obstetrics & Gynecology

## 2017-05-09 ENCOUNTER — Encounter: Payer: Self-pay | Admitting: Family Medicine

## 2017-05-09 ENCOUNTER — Ambulatory Visit (INDEPENDENT_AMBULATORY_CARE_PROVIDER_SITE_OTHER): Payer: BLUE CROSS/BLUE SHIELD | Admitting: Family Medicine

## 2017-05-09 VITALS — BP 115/74 | HR 79 | Ht 63.0 in | Wt 164.0 lb

## 2017-05-09 DIAGNOSIS — Z98891 History of uterine scar from previous surgery: Secondary | ICD-10-CM

## 2017-05-09 MED ORDER — METRONIDAZOLE 500 MG PO TABS: 500.0000 mg | ORAL_TABLET | Freq: Two times a day (BID) | ORAL | 0 refills | Status: DC

## 2017-05-09 NOTE — Progress Notes (Signed)
   Subjective:    Patient ID: Brianna Palmer, female    DOB: 03/16/1985, 32 y.o.   MRN: [id number]  HPI Patient is 2wks s/p RLTCS. Having increased vaginal discharge with foul odor. No problems with incision or breastfeeding.   Review of Systems     Objective:   Physical Exam  Constitutional: She appears well-developed and well-nourished.  HENT:  Head: Normocephalic and atraumatic.  Pulmonary/Chest: Effort normal.  Abdominal: Soft. She exhibits no distension and no mass. There is no tenderness. There is no rebound and no guarding.  Incision clean, dry, intact. No drainage or open areas.  Skin: Skin is warm and dry.  Psychiatric: She has a normal mood and affect. Her behavior is normal. Judgment and thought content normal.      Assessment & Plan:  1. Status post cesarean section No issues. F/u in 2 weeks for postpartum check. Continue to clean incision - keep dry.

## 2017-05-09 NOTE — Progress Notes (Signed)
Patient is breastfeeding. Patient is 2 weeks status post repeat C-seation. Brianna Palmer

## 2017-05-23 ENCOUNTER — Ambulatory Visit (INDEPENDENT_AMBULATORY_CARE_PROVIDER_SITE_OTHER): Payer: BLUE CROSS/BLUE SHIELD | Admitting: Obstetrics & Gynecology

## 2017-05-23 ENCOUNTER — Encounter: Payer: Self-pay | Admitting: Obstetrics & Gynecology

## 2017-05-23 VITALS — BP 124/76 | HR 108 | Wt 161.0 lb

## 2017-05-23 DIAGNOSIS — Z1389 Encounter for screening for other disorder: Secondary | ICD-10-CM

## 2017-05-23 DIAGNOSIS — Z3202 Encounter for pregnancy test, result negative: Secondary | ICD-10-CM | POA: Diagnosis not present

## 2017-05-23 DIAGNOSIS — Z30430 Encounter for insertion of intrauterine contraceptive device: Secondary | ICD-10-CM

## 2017-05-23 LAB — POCT URINE PREGNANCY: PREG TEST UR: NEGATIVE

## 2017-05-23 MED ORDER — LEVONORGESTREL 19.5 MCG/DAY IU IUD
INTRAUTERINE_SYSTEM | Freq: Once | INTRAUTERINE | Status: AC
  Administered 2017-05-23: 1 via INTRAUTERINE

## 2017-05-23 MED ORDER — IBUPROFEN 600 MG PO TABS: 600.0000 mg | ORAL_TABLET | Freq: Four times a day (QID) | ORAL | 0 refills | Status: DC

## 2017-05-23 NOTE — Patient Instructions (Signed)

## 2017-05-23 NOTE — Addendum Note (Signed)
Addended by: Anell Barr on: 05/23/2017 10:58 AM   Modules accepted: Orders

## 2017-05-23 NOTE — Progress Notes (Signed)
Patient complaining of back pain daily. Patient still taking ibuprofen daily for this. Brianna Stammer RN  Post Partum Exam  Brianna Palmer is a 32 y.o. [phone] female who presents for a postpartum visit. She is 4 weeks postpartum following a low cervical transverse Cesarean section. I have fully reviewed the prenatal and intrapartum course. The delivery was at 39.2 gestational weeks.  Anesthesia: spinal. Postpartum course has been uneventful. Baby's course has been uneventful. Baby is feeding by breast. Bleeding thin lochia. Bowel function is normal. Bladder function is normal. Patient is sexually active. Contraception method is undecided. Postpartum depression screening:neg (score 0)  The following portions of the patient's history were reviewed and updated as appropriate: allergies, current medications, past family history, past medical history, past social history, past surgical history and problem list. Last pap smear done 06/2016 and was Normal  Review of Systems Pertinent items are noted in HPI.    Objective:  Pt is breastfeeding Blood pressure 124/76, pulse (!) 108, weight 161 lb (73 kg), unknown if currently breastfeeding.  CONSTITUTIONAL: Well-developed, well-nourished female in no acute distress.  HENT:  Normocephalic, atraumatic EYES: Conjunctivae and EOM are normal. No scleral icterus.  NECK: Normal range of motion SKIN: Skin is warm and dry. No rash noted. Not diaphoretic.No pallor. NEUROLGIC: Alert and oriented to person, place, and time. Normal coordination.   GYNECOLOGY CLINIC PROCEDURE NOTE  Brianna Palmer is a 31 y.o. [phone] here for Mirena IUD insertion. No GYN concerns.  Last pap smear was on 06/2016 and was normal.  IUD Insertion Procedure Note Patient identified, informed consent performed.  Discussed risks of irregular bleeding, cramping, infection, malpositioning or misplacement of the IUD outside the uterus which may require further procedures. Time out was  performed.  Urine pregnancy test negative.  Speculum placed in the vagina.  Cervix visualized.  Cleaned with Betadine x 2.  Grasped anteriorly with a single tooth tenaculum.  Uterus sounded to 9 cm.  LilIetta UD placed per manufacturer's recommendations.  Strings trimmed to 3 cm. Tenaculum was removed, good hemostasis noted.  Patient tolerated procedure well.   Assessment:    4 weeks postpartum exam. Pap smear not done at today's visit.  Contraception counseling: Reviewed all forms of birth control options available including abstinence; over the counter/barrier methods; hormonal contraceptive medication including pill, patch, ring, injection,contraceptive implant; hormonal and nonhormonal IUDs; permanent sterilization options including vasectomy and the various tubal sterilization modalities. Risks and benefits reviewed.  Questions were answered.  Information was given to patient to review.   Plan:   1. Contraception: LnIUD placed today 2. Patient was given post-procedure instructions.  Patient was asked to follow up in 4 weeks for IUD check. 3.refilled Motrin  po q 6 hours prn pain   Brianna Palmer L. Harraway-Smith, M.D., [place] [place]

## 2017-05-23 NOTE — Addendum Note (Signed)
Addended by: Anell Barr on: 05/23/2017 11:00 AM   Modules accepted: Orders

## 2017-06-20 ENCOUNTER — Telehealth: Payer: Self-pay

## 2017-06-20 NOTE — Telephone Encounter (Signed)
Patient called and states that she had some bleeding that started yesterday. Patient states that she now feels the end of her strings at her vaginal opening.  Patient scheduled to come into office for first available.   Patient also made aware that she is not protected by this for birth control and would use another form of birth control. Armandina StammerJennifer Geron Mulford RN

## 2017-06-22 ENCOUNTER — Ambulatory Visit (INDEPENDENT_AMBULATORY_CARE_PROVIDER_SITE_OTHER): Payer: BLUE CROSS/BLUE SHIELD | Admitting: Family Medicine

## 2017-06-22 ENCOUNTER — Encounter: Payer: Self-pay | Admitting: Family Medicine

## 2017-06-22 VITALS — BP 105/66 | HR 73 | Ht 63.0 in | Wt 161.0 lb

## 2017-06-22 DIAGNOSIS — Z30431 Encounter for routine checking of intrauterine contraceptive device: Secondary | ICD-10-CM

## 2017-06-22 NOTE — Progress Notes (Signed)
Patient states she is feeling the strings. She noticed them a few days. Armandina StammerJennifer Pal Shell RN

## 2017-06-22 NOTE — Patient Instructions (Signed)

## 2017-06-22 NOTE — Progress Notes (Signed)
   Subjective:    Patient ID: Brianna Palmer is a 32 y.o. female presenting with IUD check up on 06/22/2017  HPI: Had IUD placed 4 wks ago. Has been able to feel strings externally. Has not been sexually active.  Review of Systems  Constitutional: Negative for chills and fever.  Respiratory: Negative for shortness of breath.   Cardiovascular: Negative for chest pain.  Gastrointestinal: Negative for abdominal pain, nausea and vomiting.  Genitourinary: Negative for dysuria.  Skin: Negative for rash.      Objective:    BP 105/66   Pulse 73   Ht 5\' 3"  (1.6 m)   Wt 161 lb (73 kg)   BMI 28.52 kg/m  Physical Exam  Constitutional: She is oriented to person, place, and time. She appears well-developed and well-nourished. No distress.  HENT:  Head: Normocephalic and atraumatic.  Eyes: No scleral icterus.  Neck: Neck supple.  Cardiovascular: Normal rate.  Pulmonary/Chest: Effort normal.  Abdominal: Soft.  Genitourinary:  Genitourinary Comments: IUD strings noted and tucked behind her cervix.  Neurological: She is alert and oriented to person, place, and time.  Skin: Skin is warm and dry.  Psychiatric: She has a normal mood and affect.        Assessment & Plan:  IUD check up - Doing well. No cycle. Return with any issues.   Total face-to-face time with patient: 10 minutes. Over 50% of encounter was spent on counseling and coordination of care. Return if symptoms worsen or fail to improve.  Reva Boresanya S Gwendolen Hewlett 06/22/2017 4:22 PM

## 2017-06-23 ENCOUNTER — Ambulatory Visit: Payer: BLUE CROSS/BLUE SHIELD | Admitting: Obstetrics & Gynecology

## 2017-07-19 ENCOUNTER — Telehealth: Payer: Self-pay

## 2017-07-19 NOTE — Telephone Encounter (Signed)
Patient called stating that she needs a note for work so she can pump multiple times a day. Patient states she will come by the office to pick up note. Armandina StammerJennifer Amzie Sillas RN

## 2017-10-13 ENCOUNTER — Telehealth: Payer: Self-pay

## 2017-10-13 NOTE — Telephone Encounter (Signed)
Patient calling stating she is having trouble with generalized anxiety. Patient doesn't have a primary care provider. Patient has Blue cross blue shield I asked patient to call [place] primary care to establish care. Patient given number and states she will call.   Asked patient if she is having any thoughts or harming herself or anyone else and she states no.   Discussed with Dr. Adrian Blackwater and if she is unable to get into PCP in next week or so then he can see her.   Armandina Stammer RN

## 2017-10-19 ENCOUNTER — Ambulatory Visit: Payer: BLUE CROSS/BLUE SHIELD | Admitting: Psychology

## 2017-10-19 DIAGNOSIS — F4322 Adjustment disorder with anxiety: Secondary | ICD-10-CM | POA: Diagnosis not present

## 2017-11-02 ENCOUNTER — Ambulatory Visit: Payer: BLUE CROSS/BLUE SHIELD | Admitting: Psychology

## 2017-11-16 ENCOUNTER — Ambulatory Visit: Payer: BLUE CROSS/BLUE SHIELD | Admitting: Psychology

## 2017-12-07 ENCOUNTER — Ambulatory Visit: Payer: BLUE CROSS/BLUE SHIELD | Admitting: Psychology

## 2018-10-05 ENCOUNTER — Other Ambulatory Visit: Payer: Self-pay

## 2018-10-05 ENCOUNTER — Ambulatory Visit (INDEPENDENT_AMBULATORY_CARE_PROVIDER_SITE_OTHER): Payer: Medicaid Other | Admitting: Family Medicine

## 2018-10-05 ENCOUNTER — Other Ambulatory Visit (HOSPITAL_COMMUNITY)
Admission: RE | Admit: 2018-10-05 | Discharge: 2018-10-05 | Disposition: A | Discharge status: Home / Self Care | Payer: Medicaid Other | Source: Ambulatory Visit | Attending: Family Medicine | Admitting: Family Medicine

## 2018-10-05 ENCOUNTER — Encounter: Payer: Self-pay | Admitting: Family Medicine

## 2018-10-05 VITALS — BP 115/72 | HR 89 | Ht 63.0 in | Wt 166.0 lb

## 2018-10-05 DIAGNOSIS — Z0000 Encounter for general adult medical examination without abnormal findings: Secondary | ICD-10-CM

## 2018-10-05 DIAGNOSIS — Z01419 Encounter for gynecological examination (general) (routine) without abnormal findings: Secondary | ICD-10-CM | POA: Insufficient documentation

## 2018-10-05 DIAGNOSIS — Z30432 Encounter for removal of intrauterine contraceptive device: Secondary | ICD-10-CM

## 2018-10-05 MED ORDER — FLUCONAZOLE 150 MG PO TABS: 150.0000 mg | ORAL_TABLET | Freq: Once | ORAL | 3 refills | Status: AC

## 2018-10-05 MED ORDER — NORETHIN ACE-ETH ESTRAD-FE 1-20 MG-MCG(24) PO TABS: 1.0000 | ORAL_TABLET | Freq: Every day | ORAL | 3 refills | Status: DC

## 2018-10-05 NOTE — Progress Notes (Signed)
IUD Removal  Patient was in the dorsal lithotomy position, normal external genitalia was noted.  A speculum was placed in the patient's vagina, normal discharge was noted, no lesions. The multiparous cervix was visualized, no lesions, no abnormal discharge,  and was swabbed with Betadine using scopettes.  The strings of the IUD was grasped and pulled using ring forceps.  The IUD was successfully removed in its entirety.  Patient tolerated the procedure well.   

## 2018-10-05 NOTE — Progress Notes (Signed)
GYNECOLOGY ANNUAL PREVENTATIVE CARE ENCOUNTER NOTE  Subjective:   Brianna Palmer is a 33 y.o. [phone] female here for a routine annual gynecologic exam.  Current complaints: would like IUD removed. Has vaginal irritation.   Denies abnormal vaginal bleeding, discharge, pelvic pain, problems with intercourse or other gynecologic concerns.    Gynecologic History No LMP recorded. (Menstrual status: IUD). Patient is sexually active  Contraception: IUD Last Pap: 2018. Results were: normal Last mammogram: n/a  Obstetric History OB History  Gravida Para Term Preterm AB Living  5 2 2   3 2   SAB TAB Ectopic Multiple Live Births  2 1   0 2    # Outcome Date GA Lbr Len/2nd Weight Sex Delivery Anes PTL Lv  5 Term 04/22/17 [redacted]w[redacted]d  7 lb 7.8 oz (3.395 kg) M CS-LTranv Spinal  LIV  4 Term 03/30/12 [redacted]w[redacted]d  6 lb 3.8 oz (2.83 kg) M CS-LVertical EPI  LIV  3 SAB 2012          2 SAB 2012 [redacted]w[redacted]d         1 TAB 2007            Past Medical History:  Diagnosis Date  . Headache   . HSV infection     Past Surgical History:  Procedure Laterality Date  . BUNIONECTOMY    . CESAREAN SECTION N/A 03/30/2012   Procedure: CESAREAN SECTION;  Surgeon: 04/01/2012, MD;  Location: [facility];  Service: Obstetrics;  Laterality: N/A;  Primary Cesarean Section Delivery Baby Boy @ 0157, Apgars 8/9  . CESAREAN SECTION N/A 04/22/2017   Procedure: CESAREAN SECTION;  Surgeon: 06/22/2017, MD;  Location: [facility]IRTHING SU[facility];  Service: Obstetrics;  Laterality: N/A;    Current Outpatient Medications on File Prior to Visit  Medication Sig Dispense Refill  . Levonorgestrel (LILETTA, 52 MG,) 19.5 MCG/DAY IUD IUD 1 each by Intrauterine route once.    [facility] ibuprofen (ADVIL,MOTRIN) 600 MG tablet Take 1 tablet (600 mg total) by mouth every 6 (six) hours. (Patient not taking: Reported on 10/05/2018) 30 tablet 0  . metroNIDAZOLE (FLAGYL) 500 MG tablet Take 1 tablet (500 mg total) by mouth 2 (two) times daily. (Patient not taking:  Reported on 06/22/2017) 14 tablet 0  . oxyCODONE (OXY IR/ROXICODONE) 5 MG immediate release tablet Take 1 tablet (5 mg total) by mouth every 4 (four) hours as needed (pain scale 4-7). (Patient not taking: Reported on 06/22/2017) 20 tablet 0  . senna-docusate (SENOKOT-S) 8.6-50 MG tablet Take 2 tablets by mouth daily. (Patient not taking: Reported on 06/22/2017) 30 tablet 0   No current facility-administered medications on file prior to visit.     No Known Allergies  Social History   Socioeconomic History  . Marital status: Divorced    Spouse name: Not on file  . Number of children: 1  . Years of education: Not on file  . Highest education level: Not on file  Occupational History    Employer: [employer]  Social Needs  . Financial resource strain: Not on file  . Food insecurity    Worry: Not on file    Inability: Not on file  . Transportation needs    Medical: Not on file    Non-medical: Not on file  Tobacco Use  . Smoking status: Former Smoker    Packs/day: 0.25    Types: Cigarettes    Quit date: 07/01/2013    Years since quitting: 5.2  . Smokeless tobacco: Never Used  Substance and Sexual Activity  . Alcohol use: No    Alcohol/week: 0.0 standard drinks    Comment: socially  . Drug use: No  . Sexual activity: Yes    Partners: Male  Lifestyle  . Physical activity    Days per week: Not on file    Minutes per session: Not on file  . Stress: Not on file  Relationships  . Social Musicianconnections    Talks on phone: Not on file    Gets together: Not on file    Attends religious service: Not on file    Active member of club or organization: Not on file    Attends meetings of clubs or organizations: Not on file    Relationship status: Not on file  . Intimate partner violence    Fear of current or ex partner: Not on file    Emotionally abused: Not on file    Physically abused: Not on file    Forced sexual activity: Not on file  Other Topics Concern  . Not on file  Social  History Narrative  . Not on file    Family History  Adopted: Yes  Problem Relation Age of Onset  . Other Neg Hx   . Cancer Neg Hx   . Hypertension Neg Hx   . Stroke Neg Hx     The following portions of the patient's history were reviewed and updated as appropriate: allergies, current medications, past family history, past medical history, past social history, past surgical history and problem list.  Review of Systems Pertinent items are noted in HPI.   Objective:  BP 115/72   Pulse 89   Ht 5\' 3"  (1.6 m)   Wt 166 lb (75.3 kg)   Breastfeeding No   BMI 29.41 kg/m  Wt Readings from Last 3 Encounters:  10/05/18 166 lb (75.3 kg)  06/22/17 161 lb (73 kg)  05/23/17 161 lb (73 kg)     CONSTITUTIONAL: Well-developed, well-nourished female in no acute distress.  HENT:  Normocephalic, atraumatic, External right and left ear normal. Oropharynx is clear and moist EYES: Conjunctivae and EOM are normal. Pupils are equal, round, and reactive to light. No scleral icterus.  NECK: Normal range of motion, supple, no masses.  Normal thyroid.   CARDIOVASCULAR: Normal heart rate noted, regular rhythm RESPIRATORY: Clear to auscultation bilaterally. Effort and breath sounds normal, no problems with respiration noted. BREASTS: Symmetric in size. No masses, skin changes, nipple drainage, or lymphadenopathy. ABDOMEN: Soft, normal bowel sounds, no distention noted.  No tenderness, rebound or guarding.  PELVIC: Normal appearing external genitalia; normal appearing vaginal mucosa and cervix.  No abnormal discharge noted.  Normal uterine size, no other palpable masses, no uterine or adnexal tenderness. MUSCULOSKELETAL: Normal range of motion. No tenderness.  No cyanosis, clubbing, or edema.  2+ distal pulses. SKIN: Skin is warm and dry. No rash noted. Not diaphoretic. No erythema. No pallor. NEUROLOGIC: Alert and oriented to person, place, and time. Normal reflexes, muscle tone coordination. No cranial  nerve deficit noted. PSYCHIATRIC: Normal mood and affect. Normal behavior. Normal judgment and thought content.  Assessment:  Annual gynecologic examination with pap smear   Plan:  1. Well Woman Exam Will follow up results of pap smear and manage accordingly. STD testing discussed. Patient requested vaginal testing IUD removed - change to OCPs. Will use low dose to try to make sure weight gain is minimal.  - Norethindrone Acetate-Ethinyl Estrad-FE (LOESTRIN 24 FE) 1-20 MG-MCG(24) tablet; Take 1 tablet by  mouth daily.  Dispense: 3 Package; Refill: 3 - fluconazole (DIFLUCAN) 150 MG tablet; Take 1 tablet (150 mg total) by mouth once for 1 dose. Refill in 3 days if still having symptoms  Dispense: 1 tablet; Refill: 3 - Cytology - PAP( [place])   Routine preventative health maintenance measures emphasized. Please refer to After Visit Summary for other counseling recommendations.    Loma Boston, [place] for [employer]

## 2018-10-06 LAB — CYTOLOGY - PAP
Adequacy: ABSENT
Chlamydia: NEGATIVE
Diagnosis: NEGATIVE
High risk HPV: NEGATIVE
Molecular Disclaimer: 56
Molecular Disclaimer: NEGATIVE
Molecular Disclaimer: NORMAL
Molecular Disclaimer: NORMAL
Neisseria Gonorrhea: NEGATIVE

## 2018-11-20 ENCOUNTER — Inpatient Hospital Stay (HOSPITAL_COMMUNITY)
Admission: AD | Admit: 2018-11-20 | Discharge: 2018-11-20 | Disposition: A | Discharge status: Home / Self Care | Payer: Medicaid Other | Attending: Obstetrics and Gynecology | Admitting: Obstetrics and Gynecology

## 2018-11-20 ENCOUNTER — Other Ambulatory Visit: Payer: Self-pay

## 2018-11-20 ENCOUNTER — Inpatient Hospital Stay (HOSPITAL_COMMUNITY): Payer: Medicaid Other

## 2018-11-20 ENCOUNTER — Encounter (HOSPITAL_COMMUNITY): Payer: Self-pay | Admitting: *Deleted

## 2018-11-20 DIAGNOSIS — R109 Unspecified abdominal pain: Secondary | ICD-10-CM | POA: Insufficient documentation

## 2018-11-20 DIAGNOSIS — Z87891 Personal history of nicotine dependence: Secondary | ICD-10-CM | POA: Diagnosis not present

## 2018-11-20 DIAGNOSIS — Z793 Long term (current) use of hormonal contraceptives: Secondary | ICD-10-CM | POA: Insufficient documentation

## 2018-11-20 DIAGNOSIS — O26891 Other specified pregnancy related conditions, first trimester: Secondary | ICD-10-CM | POA: Insufficient documentation

## 2018-11-20 DIAGNOSIS — Z3490 Encounter for supervision of normal pregnancy, unspecified, unspecified trimester: Secondary | ICD-10-CM

## 2018-11-20 DIAGNOSIS — Z3A01 Less than 8 weeks gestation of pregnancy: Secondary | ICD-10-CM

## 2018-11-20 DIAGNOSIS — O34219 Maternal care for unspecified type scar from previous cesarean delivery: Secondary | ICD-10-CM | POA: Diagnosis present

## 2018-11-20 DIAGNOSIS — O3680X0 Pregnancy with inconclusive fetal viability, not applicable or unspecified: Secondary | ICD-10-CM | POA: Diagnosis not present

## 2018-11-20 LAB — CBC
HCT: 38 % (ref 36.0–46.0)
Hemoglobin: 13 g/dL (ref 12.0–15.0)
MCH: 29.1 pg (ref 26.0–34.0)
MCHC: 34.2 g/dL (ref 30.0–36.0)
MCV: 85.2 fL (ref 80.0–100.0)
Platelets: 312 10*3/uL (ref 150–400)
RBC: 4.46 MIL/uL (ref 3.87–5.11)
RDW: 13.5 % (ref 11.5–15.5)
WBC: 7.6 10*3/uL (ref 4.0–10.5)
nRBC: 0 % (ref 0.0–0.2)

## 2018-11-20 LAB — HIV ANTIBODY (ROUTINE TESTING W REFLEX): HIV Screen 4th Generation wRfx: NONREACTIVE

## 2018-11-20 LAB — URINALYSIS, ROUTINE W REFLEX MICROSCOPIC
Bilirubin Urine: NEGATIVE
Glucose, UA: NEGATIVE mg/dL
Hgb urine dipstick: NEGATIVE
Ketones, ur: NEGATIVE mg/dL
Leukocytes,Ua: NEGATIVE
Nitrite: NEGATIVE
Protein, ur: NEGATIVE mg/dL
Specific Gravity, Urine: 1.013 (ref 1.005–1.030)
pH: 7 (ref 5.0–8.0)

## 2018-11-20 LAB — WET PREP, GENITAL
Clue Cells Wet Prep HPF POC: NONE SEEN
Trich, Wet Prep: NONE SEEN
Yeast Wet Prep HPF POC: NONE SEEN

## 2018-11-20 LAB — POCT PREGNANCY, URINE: Preg Test, Ur: POSITIVE — AB

## 2018-11-20 LAB — HCG, QUANTITATIVE, PREGNANCY: hCG, Beta Chain, Quant, S: 8460 m[IU]/mL — ABNORMAL HIGH (ref <5)

## 2018-11-20 NOTE — MAU Note (Signed)
Having some cramping on RLQ.  Took a HPT, that was +.  This was a few days ago. Also having some breast tenderness.

## 2018-11-20 NOTE — Discharge Instructions (Signed)
You will get a call from someone in ultrasound department to schedule your follow-up ultrasound in 2 weeks. The ultrasound will need to be performed at our [facility] office and you will speak to someone about the results shortly thereafter. You should also get scheduled to start prenatal care with [facility] [place] [facility] in about 5 weeks (when you are 10 wks or more).  Safe Medications in Pregnancy   Acne: Benzoyl Peroxide Salicylic Acid  Backache/Headache: Tylenol: 2 regular strength every 4 hours OR              2 Extra strength every 6 hours  Colds/Coughs/Allergies: Benadryl (alcohol free) 25 mg every 6 hours as needed Breath right strips Claritin Cepacol throat lozenges Chloraseptic throat spray Cold-Eeze- up to three times per day Cough drops, alcohol free Flonase (by prescription only) Guaifenesin Mucinex Robitussin DM (plain only, alcohol free) Saline nasal spray/drops Sudafed (pseudoephedrine) & Actifed ** use only after [redacted] weeks gestation and if you do not have high blood pressure Tylenol Vicks Vaporub Zinc lozenges Zyrtec   Constipation: Colace Ducolax suppositories Fleet enema Glycerin suppositories Metamucil Milk of magnesia Miralax Senokot Smooth move tea  Diarrhea: Kaopectate Imodium A-D  *NO pepto Bismol  Hemorrhoids: Anusol Anusol HC Preparation H Tucks  Indigestion: Tums Maalox Mylanta Zantac  Pepcid  Insomnia: Benadryl (alcohol free) 25mg  every 6 hours as needed Tylenol PM Unisom, no Gelcaps  Leg Cramps: Tums MagGel  Nausea/Vomiting:  Bonine Dramamine Emetrol Ginger extract Sea bands Meclizine  Nausea medication to take during pregnancy:  Unisom (doxylamine succinate 25 mg tablets) Take one tablet daily at bedtime. If symptoms are not adequately controlled, the dose can be increased to a maximum recommended dose of two tablets daily (1/2 tablet in the morning, 1/2 tablet mid-afternoon and  one at bedtime). Vitamin B6 100mg  tablets. Take one tablet twice a day (up to 200 mg per day).  Skin Rashes: Aveeno products Benadryl cream or 25mg  every 6 hours as needed Calamine Lotion 1% cortisone cream  Yeast infection: Gyne-lotrimin 7 Monistat 7   **If taking multiple medications, please check labels to avoid duplicating the same active ingredients **take medication as directed on the label ** Do not exceed 4000 mg of tylenol in 24 hours **Do not take medications that contain aspirin or ibuprofen

## 2018-11-20 NOTE — MAU Provider Note (Signed)
History     CSN: [id number]  Arrival date and time: 11/20/18 0737   First Provider Initiated Contact with Patient 11/20/18 1036      Chief Complaint  Patient presents with  . Abdominal Pain  . Possible Pregnancy   HPI  Ms.  Brianna Palmer is a 33 y.o. year old [phone] female at unsure weeks gestation who presents to MAU reporting (+) HPT 4 days ago, breast tenderness and lower abdominal pain. She reports the pain is concentrated in her RLQ. She last had SI on Sunday 11/8 and there was dyspareunia. She denies any vaginal bleeding. She reports that she has very regular menses. She had a Liletta IUD removed in September of this year and started on a generic form of LoEstrin Fe; which she could not tolerate. She has not had a period since before the removal of the [place] IUD in September. She reports she then started having the RLQ that concerned her and caused her to take the HPT initially. This is an unplanned, but desired pregnancy. She has a 32 yo son and an 17 month old son at home. She is an established patient of Dr. Adrian Blackwater at [facility].  Past Medical History:  Diagnosis Date  . Headache   . HSV infection     Past Surgical History:  Procedure Laterality Date  . BUNIONECTOMY    . CESAREAN SECTION N/A 03/30/2012   Procedure: CESAREAN SECTION;  Surgeon: Antionette Char, MD;  Location: [facility];  Service: Obstetrics;  Laterality: N/A;  Primary Cesarean Section Delivery Baby Boy @ 0157, Apgars 8/9  . CESAREAN SECTION N/A 04/22/2017   Procedure: CESAREAN SECTION;  Surgeon: Lesly Dukes, MD;  Location: [facility] BIRTHING SUITES;  Service: Obstetrics;  Laterality: N/A;    Family History  Adopted: Yes  Problem Relation Age of Onset  . Other Neg Hx   . Cancer Neg Hx   . Hypertension Neg Hx   . Stroke Neg Hx     Social History   Tobacco Use  . Smoking status: Former Smoker    Packs/day: 0.25    Types: Cigarettes    Quit date: 07/01/2013    Years since quitting: 5.3  . Smokeless  tobacco: Never Used  Substance Use Topics  . Alcohol use: No    Alcohol/week: 0.0 standard drinks    Comment: socially  . Drug use: No    Allergies: No Known Allergies  Medications Prior to Admission  Medication Sig Dispense Refill Last Dose  . Norethindrone Acetate-Ethinyl Estrad-FE (LOESTRIN 24 FE) 1-20 MG-MCG(24) tablet Take 1 tablet by mouth daily. 3 Package 3     Review of Systems  Constitutional: Negative.   HENT: Negative.   Eyes: Negative.   Respiratory: Negative.   Cardiovascular: Negative.   Gastrointestinal: Negative.   Endocrine: Negative.   Genitourinary: Positive for dyspareunia (last SI was 11/8 PM) and pelvic pain (RLQ).  Musculoskeletal: Negative.   Skin: Negative.   Allergic/Immunologic: Negative.   Neurological: Negative.   Hematological: Negative.   Psychiatric/Behavioral: Negative.    Physical Exam   Blood pressure 115/72, pulse 75, temperature 98 F (36.7 C), temperature source Oral, resp. rate 16, height 5\' 3"  (1.6 m), weight 77.2 kg, SpO2 100 %, not currently breastfeeding.  Physical Exam  Nursing note and vitals reviewed. Constitutional: She is oriented to person, place, and time. She appears well-developed and well-nourished.  HENT:  Head: Normocephalic and atraumatic.  Eyes: Pupils are equal, round, and reactive to light.  Neck: Normal range  of motion.  Cardiovascular: Normal rate.  Respiratory: Effort normal.  GI: Soft.  Genitourinary:    Genitourinary Comments: Uterus: non-tender, SE: cervix is smooth, pink, no lesions, scant amt of white vaginal d/c -- WP, GC/CT done, closed/long/firm, no CMT or friability, mild RT adnexal tenderness    Musculoskeletal: Normal range of motion.  Neurological: She is alert and oriented to person, place, and time. She has normal reflexes.  Skin: Skin is warm and dry.  Psychiatric: She has a normal mood and affect. Her behavior is normal. Judgment and thought content normal.    MAU Course   Procedures  MDM [facility] UPT CBC ABO/Rh -- not drawn known B POS HCG Wet Prep GC/CT -- pending HIV -- pending OB < 14 wks US with TV  Results for orders placed or performed during the hospital encounter of 11/20/18 (from the past 24 hour(s))  Pregnancy, urine POC     Status: Abnormal   Collection Time: 11/20/18  9:49 AM  Result Value Ref Range   Preg Test, Ur POSITIVE (A) NEGATIVE  Urinalysis, Routine w reflex microscopic     Status: None   Collection Time: 11/20/18 10:18 AM  Result Value Ref Range   Color, Urine YELLOW YELLOW   APPearance CLEAR CLEAR   Specific Gravity, Urine 1.013 1.005 - 1.030   pH 7.0 5.0 - 8.0   Glucose, UA NEGATIVE NEGATIVE mg/dL   Hgb urine dipstick NEGATIVE NEGATIVE   Bilirubin Urine NEGATIVE NEGATIVE   Ketones, ur NEGATIVE NEGATIVE mg/dL   Protein, ur NEGATIVE NEGATIVE mg/dL   Nitrite NEGATIVE NEGATIVE   Leukocytes,Ua NEGATIVE NEGATIVE  CBC     Status: None   Collection Time: 11/20/18 10:21 AM  Result Value Ref Range   WBC 7.6 4.0 - 10.5 K/uL   RBC 4.46 3.87 - 5.11 MIL/uL   Hemoglobin 13.0 12.0 - 15.0 g/dL   HCT 16.138.0 09.636.0 - 04.546.0 %   MCV 85.2 80.0 - 100.0 fL   MCH 29.1 26.0 - 34.0 pg   MCHC 34.2 30.0 - 36.0 g/dL   RDW 40.913.5 81.111.5 - 91.415.5 %   Platelets 312 150 - 400 K/uL   nRBC 0.0 0.0 - 0.2 %  hCG, quantitative, pregnancy     Status: Abnormal   Collection Time: 11/20/18 10:21 AM  Result Value Ref Range   hCG, Beta Chain, Quant, S 8,460 (H) <5 mIU/mL  HIV antibody     Status: None   Collection Time: 11/20/18 10:21 AM  Result Value Ref Range   HIV Screen 4th Generation wRfx NON REACTIVE NON REACTIVE  Wet prep, genital     Status: Abnormal   Collection Time: 11/20/18 10:46 AM   Specimen: Vaginal  Result Value Ref Range   Yeast Wet Prep HPF POC NONE SEEN NONE SEEN   Trich, Wet Prep NONE SEEN NONE SEEN   Clue Cells Wet Prep HPF POC NONE SEEN NONE SEEN   WBC, Wet Prep HPF POC FEW (A) NONE SEEN   Sperm PRESENT     [place] Ob Less Than 14 Weeks  With Ob Transvaginal  Result Date: 11/20/2018 CLINICAL DATA:  Right lower quadrant abdominal cramping pain. Early pregnancy with unknown dates and no available quantitative beta HCG at this time. EXAM: OBSTETRIC <14 WK US AND TRANSVAGINAL OB US TECHNIQUE: Both transabdominal and transvaginal ultrasound examinations were performed for complete evaluation of the gestation as well as the maternal uterus, adnexal regions, and pelvic cul-de-sac. Transvaginal technique was performed to assess early pregnancy. COMPARISON:  02/14/2017 FINDINGS: Intrauterine gestational sac: Visualized Yolk sac:  Visualized Embryo:  Not visualized Cardiac Activity: Not visualized MSD: 5.19 mm   5 w   2 d Subchorionic hemorrhage:  None visualized. Maternal uterus/adnexae: Normal appearing maternal ovaries with a right ovarian corpus luteum noted. Trace amount of free peritoneal fluid, within normal limits of physiological fluid. IMPRESSION: Intrauterine gestation all sac and yolk sac with an estimated gestational age of [redacted] weeks and 2 days with no visible fetal pole yet. No complicating features seen. Recommend follow-up quantitative B-HCG levels and follow-up US in 14 days to assess viability. This recommendation follows SRU consensus guidelines: Diagnostic Criteria for Nonviable Pregnancy Early in the First Trimester. [employer] Med 2013; 381:7711-65. Electronically Signed   By: Claudie Revering M.D.   On: 11/20/2018 11:18     Assessment and Plan  1) Intrauterine pregnancy - Advised to start prenatal care at 10 weeks or greater gestation with [facility] - Information provided on first trimester pregnancy, PNC   2) Abdominal pain during pregnancy in first trimester  - Advised to be on pelvic rest until further notice - Advised to take Tylenol 1000 mg every 6 hrs prn pain - Information provided on safe meds in pregnancy, abd pain in pregnancy and activity restrictions in pregnancy   3) Pregnancy with uncertain fetal viability, single or  unspecified fetus  - F/U ultrasound for viability in 2 wks at [facility] office  msg sent to [facility] to get pt scheduled to speak to a provider after F/U ultrasound - Advised that baby should have grown big enough in 2 wks to be seen on U/S  - Discharge patient - Advised that she would get phone calls to get next appts scheduled - Patient verbalized an understanding of the plan of care and agrees.    Laury Deep, MSN, CNM 11/20/2018, 10:36 AM

## 2018-11-21 LAB — GC/CHLAMYDIA PROBE AMP (~~LOC~~) NOT AT ARMC
Chlamydia: NEGATIVE
Comment: NEGATIVE
Comment: NORMAL
Neisseria Gonorrhea: NEGATIVE

## 2018-12-04 ENCOUNTER — Ambulatory Visit (HOSPITAL_COMMUNITY)
Admission: RE | Admit: 2018-12-04 | Discharge: 2018-12-04 | Disposition: A | Discharge status: Home / Self Care | Payer: Medicaid Other | Source: Ambulatory Visit | Attending: Obstetrics and Gynecology | Admitting: Obstetrics and Gynecology

## 2018-12-04 ENCOUNTER — Other Ambulatory Visit: Payer: Self-pay

## 2018-12-04 DIAGNOSIS — O3680X0 Pregnancy with inconclusive fetal viability, not applicable or unspecified: Secondary | ICD-10-CM

## 2018-12-04 DIAGNOSIS — O26891 Other specified pregnancy related conditions, first trimester: Secondary | ICD-10-CM | POA: Insufficient documentation

## 2018-12-04 DIAGNOSIS — R109 Unspecified abdominal pain: Secondary | ICD-10-CM | POA: Insufficient documentation

## 2018-12-06 ENCOUNTER — Ambulatory Visit: Payer: Medicaid Other | Admitting: Obstetrics & Gynecology

## 2018-12-06 ENCOUNTER — Telehealth: Payer: Self-pay | Admitting: Obstetrics & Gynecology

## 2018-12-06 DIAGNOSIS — Z3490 Encounter for supervision of normal pregnancy, unspecified, unspecified trimester: Secondary | ICD-10-CM

## 2018-12-06 NOTE — Telephone Encounter (Signed)
TC to pt to review results of [place]. Pt was off of meds to switch meds. Pt never had a period. She had an [place] which shows a viable pregnancy. Pt is a [id number] LMP 1-2 years prev. Pt was on Liletta. Was going to switch to the pills. She is happy with the pregnancy.  Begin PNV Has appt scheduled on 12/19/2018  All questions answered.   Aniesha Haughn L. Harraway-Smith, M.D., Cherlynn June

## 2018-12-19 ENCOUNTER — Ambulatory Visit (INDEPENDENT_AMBULATORY_CARE_PROVIDER_SITE_OTHER): Payer: Medicaid Other | Admitting: Advanced Practice Midwife

## 2018-12-19 ENCOUNTER — Other Ambulatory Visit: Payer: Self-pay

## 2018-12-19 ENCOUNTER — Encounter: Payer: Self-pay | Admitting: Advanced Practice Midwife

## 2018-12-19 ENCOUNTER — Other Ambulatory Visit (HOSPITAL_COMMUNITY)
Admission: RE | Admit: 2018-12-19 | Discharge: 2018-12-19 | Disposition: A | Discharge status: Home / Self Care | Payer: Medicaid Other | Source: Ambulatory Visit | Attending: Advanced Practice Midwife | Admitting: Advanced Practice Midwife

## 2018-12-19 DIAGNOSIS — Z3A08 8 weeks gestation of pregnancy: Secondary | ICD-10-CM

## 2018-12-19 DIAGNOSIS — Z3481 Encounter for supervision of other normal pregnancy, first trimester: Secondary | ICD-10-CM

## 2018-12-19 DIAGNOSIS — O0990 Supervision of high risk pregnancy, unspecified, unspecified trimester: Secondary | ICD-10-CM | POA: Insufficient documentation

## 2018-12-19 DIAGNOSIS — Z3480 Encounter for supervision of other normal pregnancy, unspecified trimester: Secondary | ICD-10-CM | POA: Diagnosis not present

## 2018-12-19 MED ORDER — CYCLOBENZAPRINE HCL 10 MG PO TABS: 10.0000 mg | ORAL_TABLET | Freq: Three times a day (TID) | ORAL | 2 refills | Status: DC | PRN

## 2018-12-19 NOTE — Progress Notes (Signed)
Subjective:    Brianna Palmer is a [id number] [redacted]w[redacted]d being seen today for her first obstetrical visit.  Her obstetrical history is significant for Hx HSV, migraines, previous C/S. Patient does intend to breast feed. Pregnancy history fully reviewed.  Patient reports nausea.  Vitals:   12/19/18 1034  BP: 100/60  Pulse: 64  Weight: 76.7 kg    HISTORY: OB History  Gravida Para Term Preterm AB Living  6 2 2   3 2   SAB TAB Ectopic Multiple Live Births  2 1   0 2    # Outcome Date GA Lbr Len/2nd Weight Sex Delivery Anes PTL Lv  6 Current           5 Term 04/22/17 [redacted]w[redacted]d  3395 g M CS-LTranv Spinal  LIV  4 Term 03/30/12 [redacted]w[redacted]d  2830 g M CS-LVertical EPI  LIV  3 SAB 2012          2 SAB 2012 [redacted]w[redacted]d         1 TAB 2007           Past Medical History:  Diagnosis Date  . Headache   . HSV infection    Past Surgical History:  Procedure Laterality Date  . BUNIONECTOMY    . CESAREAN SECTION N/A 03/30/2012   Procedure: CESAREAN SECTION;  Surgeon: 04/01/2012, MD;  Location: [facility];  Service: Obstetrics;  Laterality: N/A;  Primary Cesarean Section Delivery Baby Boy @ 0157, Apgars 8/9  . CESAREAN SECTION N/A 04/22/2017   Procedure: CESAREAN SECTION;  Surgeon: 06/22/2017, MD;  Location: [facility] BIRTHING SUITES;  Service: Obstetrics;  Laterality: N/A;   Family History  Adopted: Yes  Problem Relation Age of Onset  . Other Neg Hx   . Cancer Neg Hx   . Hypertension Neg Hx   . Stroke Neg Hx      Exam    Uterus:     Pelvic Exam:    Perineum: No Hemorrhoids   Vulva: normal, Bartholin's, Urethra, Skene's normal   Vagina:  normal mucosa, normal discharge   pH:    Cervix: multiparous appearance   Adnexa: no mass, fullness, tenderness   Bony Pelvis: gynecoid  System: Breast:  normal appearance, no masses or tenderness   Skin: normal coloration and turgor, no rashes    Neurologic: oriented, grossly non-focal   Extremities: normal strength, tone, and muscle mass   HEENT neck supple  with midline trachea   Mouth/Teeth mucous membranes moist, pharynx normal without lesions   Neck supple   Cardiovascular: regular rate and rhythm   Respiratory:  appears well, vitals normal, no respiratory distress, acyanotic, normal RR, ear and throat exam is normal, neck free of mass or lymphadenopathy, chest clear, no wheezing, crepitations, rhonchi, normal symmetric air entry   Abdomen: soft, non-tender; bowel sounds normal; no masses,  no organomegaly   Urinary: urethral meatus normal      Assessment:    Pregnancy: [facility] Patient Active Problem List   Diagnosis Date Noted  . Supervision of other normal pregnancy, antepartum 12/19/2018  . Abdominal pain during pregnancy in first trimester 11/20/2018  . Intrauterine pregnancy 11/20/2018  . Pregnancy with uncertain fetal viability 11/20/2018  . Uterine scar from previous cesarean delivery affecting pregnancy 11/20/2018  . Migraine without aura and with status migrainosus, not intractable 11/05/2016  . Muscle spasm 11/05/2016  . Herpes genitalis in women 09/28/2016  . Depressive disorder, not elsewhere classified 06/08/2012        Plan:  Initial labs drawn. Prenatal vitamins. Problem list reviewed and updated. Genetic Screening discussed Panorama: requested.  Ultrasound discussed; fetal survey: requested.  Follow up in 4 weeks. 50% of 30 min visit spent on counseling and coordination of care.   Routines reviewed Rx Flexeril for headaches (has worked in prior pregnancy) May want panorama test this time Plan repeat C/S due to hx prior C/S x 2   Hansel Feinstein 12/19/2018

## 2018-12-19 NOTE — Patient Instructions (Signed)
First Trimester of Pregnancy The first trimester of pregnancy is from week 1 until the end of week 13 (months 1 through 3). A week after a sperm fertilizes an egg, the egg will implant on the wall of the uterus. This embryo will begin to develop into a baby. Genes from you and your partner will form the baby. The female genes will determine whether the baby will be a boy or a girl. At 6-8 weeks, the eyes and face will be formed, and the heartbeat can be seen on ultrasound. At the end of 12 weeks, all the baby's organs will be formed. Now that you are pregnant, you will want to do everything you can to have a healthy baby. Two of the most important things are to get good prenatal care and to follow your health care provider's instructions. Prenatal care is all the medical care you receive before the baby's birth. This care will help prevent, find, and treat any problems during the pregnancy and childbirth. Body changes during your first trimester Your body goes through many changes during pregnancy. The changes vary from woman to woman.  You may gain or lose a couple of pounds at first.  You may feel sick to your stomach (nauseous) and you may throw up (vomit). If the vomiting is uncontrollable, call your health care provider.  You may tire easily.  You may develop headaches that can be relieved by medicines. All medicines should be approved by your health care provider.  You may urinate more often. Painful urination may mean you have a bladder infection.  You may develop heartburn as a result of your pregnancy.  You may develop constipation because certain hormones are causing the muscles that push stool through your intestines to slow down.  You may develop hemorrhoids or swollen veins (varicose veins).  Your breasts may begin to grow larger and become tender. Your nipples may stick out more, and the tissue that surrounds them (areola) may become darker.  Your gums may bleed and may be  sensitive to brushing and flossing.  Dark spots or blotches (chloasma, mask of pregnancy) may develop on your face. This will likely fade after the baby is born.  Your menstrual periods will stop.  You may have a loss of appetite.  You may develop cravings for certain kinds of food.  You may have changes in your emotions from day to day, such as being excited to be pregnant or being concerned that something may go wrong with the pregnancy and baby.  You may have more vivid and strange dreams.  You may have changes in your hair. These can include thickening of your hair, rapid growth, and changes in texture. Some women also have hair loss during or after pregnancy, or hair that feels dry or thin. Your hair will most likely return to normal after your baby is born. What to expect at prenatal visits During a routine prenatal visit:  You will be weighed to make sure you and the baby are growing normally.  Your blood pressure will be taken.  Your abdomen will be measured to track your baby's growth.  The fetal heartbeat will be listened to between weeks 10 and 14 of your pregnancy.  Test results from any previous visits will be discussed. Your health care provider may ask you:  How you are feeling.  If you are feeling the baby move.  If you have had any abnormal symptoms, such as leaking fluid, bleeding, severe headaches, or abdominal   cramping.  If you are using any tobacco products, including cigarettes, chewing tobacco, and electronic cigarettes.  If you have any questions. Other tests that may be performed during your first trimester include:  Blood tests to find your blood type and to check for the presence of any previous infections. The tests will also be used to check for low iron levels (anemia) and protein on red blood cells (Rh antibodies). Depending on your risk factors, or if you previously had diabetes during pregnancy, you may have tests to check for high blood sugar  that affects pregnant women (gestational diabetes).  Urine tests to check for infections, diabetes, or protein in the urine.  An ultrasound to confirm the proper growth and development of the baby.  Fetal screens for spinal cord problems (spina bifida) and Down syndrome.  HIV (human immunodeficiency virus) testing. Routine prenatal testing includes screening for HIV, unless you choose not to have this test.  You may need other tests to make sure you and the baby are doing well. Follow these instructions at home: Medicines  Follow your health care provider's instructions regarding medicine use. Specific medicines may be either safe or unsafe to take during pregnancy.  Take a prenatal vitamin that contains at least 600 micrograms (mcg) of folic acid.  If you develop constipation, try taking a stool softener if your health care provider approves. Eating and drinking   Eat a balanced diet that includes fresh fruits and vegetables, whole grains, good sources of protein such as meat, eggs, or tofu, and low-fat dairy. Your health care provider will help you determine the amount of weight gain that is right for you.  Avoid raw meat and uncooked cheese. These carry germs that can cause birth defects in the baby.  Eating four or five small meals rather than three large meals a day may help relieve nausea and vomiting. If you start to feel nauseous, eating a few soda crackers can be helpful. Drinking liquids between meals, instead of during meals, also seems to help ease nausea and vomiting.  Limit foods that are high in fat and processed sugars, such as fried and sweet foods.  To prevent constipation: ? Eat foods that are high in fiber, such as fresh fruits and vegetables, whole grains, and beans. ? Drink enough fluid to keep your urine clear or pale yellow. Activity  Exercise only as directed by your health care provider. Most women can continue their usual exercise routine during  pregnancy. Try to exercise for 30 minutes at least 5 days a week. Exercising will help you: ? Control your weight. ? Stay in shape. ? Be prepared for labor and delivery.  Experiencing pain or cramping in the lower abdomen or lower back is a good sign that you should stop exercising. Check with your health care provider before continuing with normal exercises.  Try to avoid standing for long periods of time. Move your legs often if you must stand in one place for a long time.  Avoid heavy lifting.  Wear low-heeled shoes and practice good posture.  You may continue to have sex unless your health care provider tells you not to. Relieving pain and discomfort  Wear a good support bra to relieve breast tenderness.  Take warm sitz baths to soothe any pain or discomfort caused by hemorrhoids. Use hemorrhoid cream if your health care provider approves.  Rest with your legs elevated if you have leg cramps or low back pain.  If you develop varicose veins in   your legs, wear support hose. Elevate your feet for 15 minutes, 3-4 times a day. Limit salt in your diet. Prenatal care  Schedule your prenatal visits by the twelfth week of pregnancy. They are usually scheduled monthly at first, then more often in the last 2 months before delivery.  Write down your questions. Take them to your prenatal visits.  Keep all your prenatal visits as told by your health care provider. This is important. Safety  Wear your seat belt at all times when driving.  Make a list of emergency phone numbers, including numbers for family, friends, the hospital, and police and fire departments. General instructions  Ask your health care provider for a referral to a local prenatal education class. Begin classes no later than the beginning of month 6 of your pregnancy.  Ask for help if you have counseling or nutritional needs during pregnancy. Your health care provider can offer advice or refer you to specialists for help  with various needs.  Do not use hot tubs, steam rooms, or saunas.  Do not douche or use tampons or scented sanitary pads.  Do not cross your legs for long periods of time.  Avoid cat litter boxes and soil used by cats. These carry germs that can cause birth defects in the baby and possibly loss of the fetus by miscarriage or stillbirth.  Avoid all smoking, herbs, alcohol, and medicines not prescribed by your health care provider. Chemicals in these products affect the formation and growth of the baby.  Do not use any products that contain nicotine or tobacco, such as cigarettes and e-cigarettes. If you need help quitting, ask your health care provider. You may receive counseling support and other resources to help you quit.  Schedule a dentist appointment. At home, brush your teeth with a soft toothbrush and be gentle when you floss. Contact a health care provider if:  You have dizziness.  You have mild pelvic cramps, pelvic pressure, or nagging pain in the abdominal area.  You have persistent nausea, vomiting, or diarrhea.  You have a bad smelling vaginal discharge.  You have pain when you urinate.  You notice increased swelling in your face, hands, legs, or ankles.  You are exposed to fifth disease or chickenpox.  You are exposed to German measles (rubella) and have never had it. Get help right away if:  You have a fever.  You are leaking fluid from your vagina.  You have spotting or bleeding from your vagina.  You have severe abdominal cramping or pain.  You have rapid weight gain or loss.  You vomit blood or material that looks like coffee grounds.  You develop a severe headache.  You have shortness of breath.  You have any kind of trauma, such as from a fall or a car accident. Summary  The first trimester of pregnancy is from week 1 until the end of week 13 (months 1 through 3).  Your body goes through many changes during pregnancy. The changes vary from  woman to woman.  You will have routine prenatal visits. During those visits, your health care provider will examine you, discuss any test results you may have, and talk with you about how you are feeling. This information is not intended to replace advice given to you by your health care provider. Make sure you discuss any questions you have with your health care provider. Document Released: 12/22/2000 Document Revised: 12/10/2016 Document Reviewed: 12/10/2015 Elsevier Patient Education  2020 [employer] Inc.  

## 2018-12-20 LAB — GC/CHLAMYDIA PROBE AMP (~~LOC~~) NOT AT ARMC
Chlamydia: NEGATIVE
Comment: NEGATIVE
Comment: NORMAL
Neisseria Gonorrhea: NEGATIVE

## 2018-12-21 LAB — URINE CULTURE: Organism ID, Bacteria: NO GROWTH

## 2019-01-04 LAB — OBSTETRIC PANEL, INCLUDING HIV
Antibody Screen: NEGATIVE
Basophils Absolute: 0 10*3/uL (ref 0.0–0.2)
Basos: 0 %
EOS (ABSOLUTE): 0.1 10*3/uL (ref 0.0–0.4)
Eos: 1 %
HIV Screen 4th Generation wRfx: NONREACTIVE
Hematocrit: 34.5 % (ref 34.0–46.6)
Hemoglobin: 12.1 g/dL (ref 11.1–15.9)
Hepatitis B Surface Ag: NEGATIVE
Immature Grans (Abs): 0 10*3/uL (ref 0.0–0.1)
Immature Granulocytes: 0 %
Lymphocytes Absolute: 2.9 10*3/uL (ref 0.7–3.1)
Lymphs: 37 %
MCH: 28.9 pg (ref 26.6–33.0)
MCHC: 35.1 g/dL (ref 31.5–35.7)
MCV: 83 fL (ref 79–97)
Monocytes Absolute: 0.5 10*3/uL (ref 0.1–0.9)
Monocytes: 7 %
Neutrophils Absolute: 4.3 10*3/uL (ref 1.4–7.0)
Neutrophils: 55 %
Platelets: 303 10*3/uL (ref 150–450)
RBC: 4.18 x10E6/uL (ref 3.77–5.28)
RDW: 14 % (ref 11.7–15.4)
RPR Ser Ql: NONREACTIVE
Rh Factor: POSITIVE
Rubella Antibodies, IGG: 2.97 index (ref >0.99)
WBC: 7.8 10*3/uL (ref 3.4–10.8)

## 2019-01-04 LAB — SMN1 COPY NUMBER ANALYSIS (SMA CARRIER SCREENING)

## 2019-01-08 ENCOUNTER — Telehealth: Payer: Self-pay

## 2019-01-08 DIAGNOSIS — Z3480 Encounter for supervision of other normal pregnancy, unspecified trimester: Secondary | ICD-10-CM

## 2019-01-08 NOTE — Telephone Encounter (Signed)
Patient called stating that her headaches are getting worse. Patient states that flerixil just makes her sleepy and doesn't really help her headaches. Patient offered refferal to [facility] and states she is willing to go. Patient also wondering if she can have script for foiercet.   Patient also states "I dont feel as pregnant as I used to". Patient states she has had an increase in energy and decrease in breast tenderness. Assured her she is 11.2 weeks and this could be just normal pregnancy symptoms. Denies any bleeding. Will place order for her anatomy scan to be set up as patient is having virtual visit next visit. Kathrene Alu RN

## 2019-01-09 MED ORDER — BUTALBITAL-APAP-CAFFEINE 50-325-40 MG PO CAPS: 1.0000 | ORAL_CAPSULE | Freq: Four times a day (QID) | ORAL | 3 refills | Status: DC | PRN

## 2019-01-09 NOTE — Telephone Encounter (Signed)
Prescription sent. She should use this as minimally as possible. Fioricet, when taken on a regular basis, can cause rebound headaches when it wears off.

## 2019-01-09 NOTE — Addendum Note (Signed)
Addended by: Truett Mainland on: 01/09/2019 08:00 AM   Modules accepted: Orders

## 2019-01-09 NOTE — Telephone Encounter (Signed)
Patient called and made aware that script for Fioricet sent to her pharmacy. And informed patient that she needs to use this as little as possible as Dr. Nehemiah Settle states that it can cause rebound headaches. Patient states understanding. Patient also made aware of appointment on Jan. 29 with [facility] the headache specialist. Patient accepts appointment. Kathrene Alu RN

## 2019-01-18 ENCOUNTER — Telehealth (INDEPENDENT_AMBULATORY_CARE_PROVIDER_SITE_OTHER): Payer: Medicaid Other | Admitting: Family Medicine

## 2019-01-18 VITALS — BP 125/87 | HR 81

## 2019-01-18 DIAGNOSIS — Z3A33 33 weeks gestation of pregnancy: Secondary | ICD-10-CM | POA: Diagnosis not present

## 2019-01-18 DIAGNOSIS — A6009 Herpesviral infection of other urogenital tract: Secondary | ICD-10-CM

## 2019-01-18 DIAGNOSIS — G43001 Migraine without aura, not intractable, with status migrainosus: Secondary | ICD-10-CM

## 2019-01-18 DIAGNOSIS — O34219 Maternal care for unspecified type scar from previous cesarean delivery: Secondary | ICD-10-CM | POA: Diagnosis not present

## 2019-01-18 DIAGNOSIS — Z3480 Encounter for supervision of other normal pregnancy, unspecified trimester: Secondary | ICD-10-CM

## 2019-01-18 DIAGNOSIS — Z98891 History of uterine scar from previous surgery: Secondary | ICD-10-CM

## 2019-01-18 NOTE — Progress Notes (Signed)
   TELEHEALTH OBSTETRICS PRENATAL VIRTUAL VIDEO VISIT ENCOUNTER NOTE  Provider location: Center for [facility]   I connected with Brianna Palmer on 01/18/19 at  1:15 PM EST by MyChart Video Encounter at home and verified that I am speaking with the correct person using two identifiers.   I discussed the limitations, risks, security and privacy concerns of performing an evaluation and management service virtually and the availability of in person appointments. I also discussed with the patient that there may be a patient responsible charge related to this service. The patient expressed understanding and agreed to proceed. Subjective:  Brianna Palmer is a 34 y.o. [id number] at [redacted]w[redacted]d being seen today for ongoing prenatal care.  She is currently monitored for the following issues for this low-risk pregnancy and has Depressive disorder, not elsewhere classified; Herpes genitalis in women; Migraine without aura and with status migrainosus, not intractable; Muscle spasm; Abdominal pain during pregnancy in first trimester; Intrauterine pregnancy; Pregnancy with uncertain fetal viability; Uterine scar from previous cesarean delivery affecting pregnancy; and Supervision of other normal pregnancy, antepartum on their problem list.  Patient reports no complaints.  Contractions: Not present. Vag. Bleeding: None.  Movement: Absent. Denies any leaking of fluid.   The following portions of the patient's history were reviewed and updated as appropriate: allergies, current medications, past family history, past medical history, past social history, past surgical history and problem list.   Objective:   Vitals:   01/18/19 1305  BP: 125/87  Pulse: 81    Fetal Status:     Movement: Absent     General:  Alert, oriented and cooperative. Patient is in no acute distress.  Respiratory: Normal respiratory effort, no problems with respiration noted  Mental Status: Normal mood and affect.  Normal behavior. Normal judgment and thought content.  Rest of physical exam deferred due to type of encounter  Imaging: No results found.  Assessment and Plan:  Pregnancy: [id number] at [redacted]w[redacted]d 1. Supervision of other normal pregnancy, antepartum No fetal movement yet  2. Status post cesarean section 2 prior sections, would like to VBAC - discussed that will not induce with 2 prior section, but can labor  3. Uterine scar from previous cesarean delivery affecting pregnancy  4. Herpes genitalis in women Will need valtrex  5. Migraine without aura and with status migrainosus, not intractable controlled  Preterm labor symptoms and general obstetric precautions including but not limited to vaginal bleeding, contractions, leaking of fluid and fetal movement were reviewed in detail with the patient. I discussed the assessment and treatment plan with the patient. The patient was provided an opportunity to ask questions and all were answered. The patient agreed with the plan and demonstrated an understanding of the instructions. The patient was advised to call back or seek an in-person office evaluation/go to [facility] at [facility] for any urgent or concerning symptoms. Please refer to After Visit Summary for other counseling recommendations.   I provided 12 minutes of face-to-face time during this encounter.  Return in about 4 weeks (around 02/15/2019) for In Office, OB f/u.  Future Appointments  Date Time Provider Department Center  02/09/2019  9:30 AM Teague Docia Chuck [facility]  03/02/2019 10:30 AM [facility] [place] 1 WH-MFC[facility]  03/02/2019 10:40 AM [facility]-M[facility] NURSE [facility]US    Levie Heritage, DO Center for [employer], [facility]

## 2019-01-23 ENCOUNTER — Telehealth: Payer: Self-pay

## 2019-01-23 NOTE — Telephone Encounter (Signed)
Patient called the after line yesterday evening saying she doesn't "feel pregnant anymore". They advised her to go to the Emergency room last night.   Patient called office this morning and spoke with Michael Boston first saying that she doesn't have anymore pregnancy symptoms- lack of tender breasts. Patient told Michael Boston I would scan her if she came to the office.   I then picked up the phone to speak with the patient who says she has lack of pregnancy symptoms. I explained that tender breast and nausea can slack off due to her entered the second trimester (13.3 weeks). Patient advised to go to hospital to be checked out. Patient doesn't want to do this.  Patient offered appointment later in the week with provider but with the understanding that I told her to go to MAU for evaluation. Patient then states her husband is about to get a vasectomy and needs to know if this is still viable pregnancy. Patient advised we can see her later this week for an ob visit. Armandina Stammer RN

## 2019-01-25 ENCOUNTER — Other Ambulatory Visit: Payer: Self-pay

## 2019-01-25 ENCOUNTER — Ambulatory Visit (INDEPENDENT_AMBULATORY_CARE_PROVIDER_SITE_OTHER): Payer: Medicaid Other | Admitting: Family Medicine

## 2019-01-25 VITALS — BP 106/72 | HR 93 | Wt 171.1 lb

## 2019-01-25 DIAGNOSIS — O26891 Other specified pregnancy related conditions, first trimester: Secondary | ICD-10-CM

## 2019-01-25 DIAGNOSIS — G43001 Migraine without aura, not intractable, with status migrainosus: Secondary | ICD-10-CM

## 2019-01-25 DIAGNOSIS — Z3480 Encounter for supervision of other normal pregnancy, unspecified trimester: Secondary | ICD-10-CM

## 2019-01-25 DIAGNOSIS — Z3A13 13 weeks gestation of pregnancy: Secondary | ICD-10-CM

## 2019-01-25 DIAGNOSIS — A6009 Herpesviral infection of other urogenital tract: Secondary | ICD-10-CM

## 2019-01-25 NOTE — Progress Notes (Signed)
   PRENATAL VISIT NOTE  Subjective:  Brianna Palmer is a 34 y.o. [id number] at [redacted]w[redacted]d being seen today for ongoing prenatal care.  She is currently monitored for the following issues for this low-risk pregnancy and has Depressive disorder, not elsewhere classified; Herpes genitalis in women; Migraine without aura and with status migrainosus, not intractable; Muscle spasm; Abdominal pain during pregnancy in first trimester; Intrauterine pregnancy; Pregnancy with uncertain fetal viability; Uterine scar from previous cesarean delivery affecting pregnancy; and Supervision of other normal pregnancy, antepartum on their problem list.  Patient reports headache.  Contractions: Not present. Vag. Bleeding: None.  Movement: Absent. Denies leaking of fluid.   The following portions of the patient's history were reviewed and updated as appropriate: allergies, current medications, past family history, past medical history, past social history, past surgical history and problem list.   Objective:   Vitals:   01/25/19 1342  BP: 106/72  Pulse: 93  Weight: 171 lb 1.3 oz (77.6 kg)    Fetal Status: Fetal Heart Rate (bpm): 156 Fundal Height: 13 cm Movement: Absent     General:  Alert, oriented and cooperative. Patient is in no acute distress.  Skin: Skin is warm and dry. No rash noted.   Cardiovascular: Normal heart rate noted  Respiratory: Normal respiratory effort, no problems with respiration noted  Abdomen: Soft, gravid, appropriate for gestational age.  Pain/Pressure: Present     Pelvic: Cervical exam deferred        Extremities: Normal range of motion.  Edema: None  Mental Status: Normal mood and affect. Normal behavior. Normal judgment and thought content.   Assessment and Plan:  Pregnancy: [id number] at [redacted]w[redacted]d  1. Supervision of other normal pregnancy, antepartum FHT normal  2. Migraine without aura and with status migrainosus, not intractable Has appt with headache clinic  3. Herpes genitalis in  women ppx at 35-36 weeks  Preterm labor symptoms and general obstetric precautions including but not limited to vaginal bleeding, contractions, leaking of fluid and fetal movement were reviewed in detail with the patient. Please refer to After Visit Summary for other counseling recommendations.   Return in about 4 weeks (around 02/22/2019) for HR OB f/u, In Office.  Future Appointments  Date Time Provider Department Center  02/09/2019  9:30 AM Teague Docia Chuck [facility]  03/02/2019 10:30 AM [facility] [place] 1 WH-MFCUS [facility]-US  03/02/2019 10:40 AM [facility]-MFC NURSE [facility]-MFC M[facility]-US    Levie Heritage, DO

## 2019-02-09 ENCOUNTER — Institutional Professional Consult (permissible substitution): Payer: Medicaid Other | Admitting: Physician Assistant

## 2019-02-09 DIAGNOSIS — R519 Headache, unspecified: Secondary | ICD-10-CM

## 2019-03-01 ENCOUNTER — Encounter: Payer: Medicaid Other | Admitting: Obstetrics & Gynecology

## 2019-03-02 ENCOUNTER — Ambulatory Visit (HOSPITAL_COMMUNITY): Payer: Medicaid Other | Admitting: *Deleted

## 2019-03-02 ENCOUNTER — Ambulatory Visit (HOSPITAL_COMMUNITY): Payer: Self-pay | Admitting: Genetic Counselor

## 2019-03-02 ENCOUNTER — Encounter (HOSPITAL_COMMUNITY): Payer: Self-pay

## 2019-03-02 ENCOUNTER — Ambulatory Visit (HOSPITAL_COMMUNITY)
Admission: RE | Admit: 2019-03-02 | Discharge: 2019-03-02 | Disposition: A | Discharge status: Home / Self Care | Payer: Medicaid Other | Source: Ambulatory Visit | Attending: Family Medicine | Admitting: Family Medicine

## 2019-03-02 ENCOUNTER — Ambulatory Visit (HOSPITAL_COMMUNITY): Payer: Medicaid Other

## 2019-03-02 ENCOUNTER — Other Ambulatory Visit: Payer: Self-pay

## 2019-03-02 DIAGNOSIS — O34219 Maternal care for unspecified type scar from previous cesarean delivery: Secondary | ICD-10-CM

## 2019-03-02 DIAGNOSIS — O283 Abnormal ultrasonic finding on antenatal screening of mother: Secondary | ICD-10-CM

## 2019-03-02 DIAGNOSIS — O358XX0 Maternal care for other (suspected) fetal abnormality and damage, not applicable or unspecified: Secondary | ICD-10-CM

## 2019-03-02 DIAGNOSIS — Z3A18 18 weeks gestation of pregnancy: Secondary | ICD-10-CM | POA: Diagnosis not present

## 2019-03-02 DIAGNOSIS — O359XX0 Maternal care for (suspected) fetal abnormality and damage, unspecified, not applicable or unspecified: Secondary | ICD-10-CM | POA: Diagnosis not present

## 2019-03-02 DIAGNOSIS — Z31430 Encounter of female for testing for genetic disease carrier status for procreative management: Secondary | ICD-10-CM

## 2019-03-02 DIAGNOSIS — Z3480 Encounter for supervision of other normal pregnancy, unspecified trimester: Secondary | ICD-10-CM

## 2019-03-02 DIAGNOSIS — R109 Unspecified abdominal pain: Secondary | ICD-10-CM

## 2019-03-02 DIAGNOSIS — O26891 Other specified pregnancy related conditions, first trimester: Secondary | ICD-10-CM

## 2019-03-02 DIAGNOSIS — Z3490 Encounter for supervision of normal pregnancy, unspecified, unspecified trimester: Secondary | ICD-10-CM

## 2019-03-02 NOTE — Consult Note (Signed)
MFM Note  This patient was seen for a detailed fetal anatomy scan. She denies any significant past medical history and denies any problems in her current pregnancy.    The patient has not had any screening tests for fetal aneuploidy drawn in her current pregnancy.  She was informed that the fetal growth and amniotic fluid level were appropriate for her gestational age.   On today's exam, echogenic bowel was noted.  There is also a cystic structure noted within the echogenic bowel.  The causes of echogenic bowel including a normal variant, fetal aneuploidy, swallowed  blood, cystic fibrosis, viral infections, and a gastrointestinal tract abnormality in the fetus were discussed. She denies any recent vaginal bleeding.    Due to the echogenic bowel noted today, the patient will have her blood drawn for CMV and toxoplasmosis antibody titer levels along with testing to determine if she is a carrier for cystic fibrosis.  She will also have a cell free DNA test drawn today.  The patient was advised that the cystic structure within the echogenic bowel may be a sign of a fetal gastrointestinal abnormality.  We will not be able to determine if the baby has a gastrointestinal tract abnormality until after birth.  She was advised that gastrointestinal tract abnormalities may be corrected after birth.  We will continue to follow her with serial exams to assess this finding.  We will also consider a consult with pediatric surgery should this finding persist later in her pregnancy.  Due to the echogenic bowel noted today, the patient was offered and declined an amniocentesis for definitive diagnosis of fetal aneuploidy and viral infections.  We will await the results of the blood tests.  The patient also met with our genetic counselor today to discuss the significance of these findings.    The limitations of ultrasound in the detection of all anomalies was discussed today.  A follow-up exam was scheduled in 4  weeks.  A total of 30 minutes was spent counseling and coordinating the care for this patient.  Greater than 50% of the time was spent in direct face-to-face contact.

## 2019-03-05 ENCOUNTER — Other Ambulatory Visit (HOSPITAL_COMMUNITY): Payer: Self-pay | Admitting: *Deleted

## 2019-03-05 ENCOUNTER — Telehealth (HOSPITAL_COMMUNITY): Payer: Self-pay | Admitting: Genetic Counselor

## 2019-03-05 DIAGNOSIS — O358XX0 Maternal care for other (suspected) fetal abnormality and damage, not applicable or unspecified: Secondary | ICD-10-CM

## 2019-03-05 DIAGNOSIS — O35DXX0 Maternal care for other (suspected) fetal abnormality and damage, fetal gastrointestinal anomalies, not applicable or unspecified: Secondary | ICD-10-CM

## 2019-03-05 NOTE — Telephone Encounter (Signed)
Called Ms. Theiler to discuss that her prenatal infection testing was normal. CMV IgG was elevated indicating a past exposure to CMV; however, CMV IgM was normal, indicating that there has been no recent/current CMV infection. We reviewed that it is common to have had a past exposure to CMV; many people are unaware that they were ever affected. Toxoplasmosis IgM and IgG were both normal as well. This indicates that echogenic bowel identified on ultrasound is likely not due to an intrauterine infection.  We reviewed that additional screening for chromosomal aneuploidies and cystic fibrosis carrier status are still pending. Results from MaterniT21 noninvasive prenatal screening (NIPS) should be available later this week or early next week. Results from cystic fibrosis carrier screening will likely take another 2-3 weeks to be returned. I will continue to call Ms. Currie with updates as more results become available. She confirmed that she had no further questions at this time.  Gershon Crane, MS Genetic Counselor

## 2019-03-05 NOTE — Progress Notes (Signed)
I briefly met with Brianna Palmer to discuss the finding of echogenic bowel identified on her anatomy ultrasound today. We discussed the possible causes for echogenic bowel, including normal variation, fetal blood ingestion, fetal aneuploidy, cystic fibrosis, growth restriction, infection, and a fetal gastrointestinal malformation or obstruction. Most babies with echogenic bowel identified on ultrasound are normal.   We discussed available testing options to attempt to determine the etiology of the fetus's echogenic bowel. Firstly, we could pursue noninvasive prenatal screening (NIPS) for chromosomal aneuploidies. Specifically, we discussed that NIPS analyzes cell free DNA originating from the placenta that is found in the maternal blood circulation during pregnancy. This test is not diagnostic for chromosome conditions, but can provide information regarding the presence or absence of extra fetal DNA for chromosomes 13, 18, 21, and the sex chromosomes. Thus, it would not identify or rule out all fetal aneuploidy. The reported detection rate is 91-99% for trisomies 21, 18, 13, and sex chromosome aneuploidies. The false positive rate is reported to be less than 0.1% for any of these conditions.   Secondly, we discussed that carrier screening for cystic fibrosis is available. Cystic fibrosis (CF) is an inherited disease characterized by the buildup of thick, sticky mucus that can damage many of the body's organs. The most common signs and symptoms of CF include progressive damage to the respiratory system and chronic digestive system problems. Some fetuses with CF demonstrate echogenic bowel on prenatal ultrasound. Features of CF and their severity varies among affected individuals. CF is inherited in an autosomal recessive pattern, where both parents must be carriers of the condition to have a 1 in 4 (25%) risk of having an affected child. We discussed that if Brianna Palmer were to be identified as a carrier for CF,  carrier screening would then be recommended for her partner to refine the risks for CF in the current pregnancy.   Thirdly, testing for infections via blood is available. Maternal infections contracted during pregnancy can sometimes be passed to the fetus. If a fetus becomes infected, the fetus is at risk for developing problems, such as echogenic bowel. We discussed the option of performing a blood test for common infections like cytomegalovirus and toxoplasmosis, both of which can be associated with echogenic bowel. If it is determined that Brianna Palmer has had a recent infection, amniotic fluid obtained from an amniocentesis can also be tested to learn if a maternal infection has reached the fetus.  Finally, prenatal diagnostic testing for fetal aneuploidy, cystic fibrosis, and prenatal infections is available via amniocentesis. We discussed the technical aspects of the procedure and quoted up to a 1 in 500 (0.2%) risk for spontaneous pregnancy loss or other adverse pregnancy outcomes as a result of amniocentesis. Cultured cells from an amniocentesis sample allow for the visualization of a fetal karyotype, which can detect >99% of chromosomal aberrations. Chromosomal microarray can also be performed to identify smaller deletions or duplications of fetal chromosomal material. Amniocentesis could also be performed to assess whether the baby is affected by CF or prenatal infections such as CMV or toxoplasmosis. After careful consideration, Brianna Palmer declined amniocentesis at this time. She understands that amniocentesis is available at any point after 16 weeks of pregnancy and that she may opt to undergo the procedure at a later date should she change her mind. She also understands that we will continue to follow her via ultrasound to assess the echogenic bowel and fetal growth.  Brianna Palmer had her blood drawn for MaterniT21 NIPS, CF carrier  screening, CMV IgG and IgM, and toxoplasmosis IgG and IgM today.  Results from testing for infections will likely become available by early next week. Results from [place] will take 5-7 days to be returned. Results from CF carrier screening will take 2-3 weeks to be returned. I will call Brianna Palmer when results become available.   I provided Brianna Palmer with a patient information sheet on echogenic bowel from the [place]. Brianna Palmer confirmed that she had no further questions at this time.  I counseled Brianna Palmer regarding the above risks and available options. The approximate face-to-face time with the genetic counselor was 15 minutes.   Buelah Manis, MS Genetic Counselor

## 2019-03-06 ENCOUNTER — Telehealth: Payer: Self-pay

## 2019-03-06 NOTE — Telephone Encounter (Signed)
Patient called stating that she would like her partner to come to visit with her in the office due to the distressing news from her last ultrasound.   (Patient is referencing echogenic bowel found on ultrasound 03-02-2019) I explained to her that we are still not allowing visitors in the office at this time, but that we would allow him to be on the phone and discuss with her and the doctor at her appointment. Patient made aware that this is what we have been doing for our patients in the last year due to covid. Patient states that "that's not the answer he is going to want" and I proceeded to follow up with patient about ensuring she has a follow up ultrasound scheduled and she hung up.   Patient called back and left voicemail on office line (as I was on the phone with another patient when she called back)  I returned call to patient and was on the phone at length with her discussing her concerns (=28 mins). Patient brought up concerns that I have swept her concerns under the rug and that black women have been mistreated historically in the past by the medical system.  I expressed my concerns that she felt this way as she has been a long standing patient of our practice and in no way want her to think that I have no concern for her. I listened to her concerns of being pregnant and age 34 and having 2 previous c-sections. I did explain that [facility] for women[facility] as a whole does recognize health disparities and measures to help with this is  she was seen by MFM and had genetic counseling appointment and we even use babyscripts as another point of interaction with the patient.

## 2019-03-06 NOTE — Telephone Encounter (Signed)
After long discussion with the patient she expressed her thoughts and concerns and I reassured her that I and this practice does have concern for her and this pregnancy.  We then talked about echogenic bowel and how this is part of the whole puzzle and we need more information. Assured her the MaternTi 21 will give [place] more information and hopefully this will be back at the time of her appointment with Dr. Adrian Blackwater this Thursday. I encouraged her to express her concern over not being heard with him as well on Thursday. Patient accepts this advice and plans to attend her appointment on Thursday. Armandina Stammer RN

## 2019-03-08 ENCOUNTER — Ambulatory Visit (INDEPENDENT_AMBULATORY_CARE_PROVIDER_SITE_OTHER): Payer: Medicaid Other | Admitting: Family Medicine

## 2019-03-08 ENCOUNTER — Other Ambulatory Visit: Payer: Self-pay

## 2019-03-08 VITALS — BP 109/66 | HR 83 | Wt 174.0 lb

## 2019-03-08 DIAGNOSIS — O34219 Maternal care for unspecified type scar from previous cesarean delivery: Secondary | ICD-10-CM

## 2019-03-08 DIAGNOSIS — A6009 Herpesviral infection of other urogenital tract: Secondary | ICD-10-CM

## 2019-03-08 DIAGNOSIS — O98312 Other infections with a predominantly sexual mode of transmission complicating pregnancy, second trimester: Secondary | ICD-10-CM

## 2019-03-08 DIAGNOSIS — O358XX0 Maternal care for other (suspected) fetal abnormality and damage, not applicable or unspecified: Secondary | ICD-10-CM

## 2019-03-08 DIAGNOSIS — Z3480 Encounter for supervision of other normal pregnancy, unspecified trimester: Secondary | ICD-10-CM

## 2019-03-08 DIAGNOSIS — Z3A19 19 weeks gestation of pregnancy: Secondary | ICD-10-CM

## 2019-03-08 NOTE — Progress Notes (Signed)
   PRENATAL VISIT NOTE  Subjective:  Brianna Palmer is a 34 y.o. [id number] at [redacted]w[redacted]d being seen today for ongoing prenatal care.  She is currently monitored for the following issues for this high-risk pregnancy and has Depressive disorder, not elsewhere classified; Herpes genitalis in women; Migraine without aura and with status migrainosus, not intractable; Uterine scar from previous cesarean delivery affecting pregnancy; Supervision of other normal pregnancy, antepartum; and Suspected damage to fetus from other disease in mother, affecting management of mother, antepartum condition or complication on their problem list.  Patient reports no complaints.  Contractions: Not present. Vag. Bleeding: None.  Movement: Present. Denies leaking of fluid.   The following portions of the patient's history were reviewed and updated as appropriate: allergies, current medications, past family history, past medical history, past social history, past surgical history and problem list.   Objective:   Vitals:   03/08/19 1023  BP: 109/66  Pulse: 83  Weight: 174 lb (78.9 kg)    Fetal Status: Fetal Heart Rate (bpm): 143   Movement: Present     General:  Alert, oriented and cooperative. Patient is in no acute distress.  Skin: Skin is warm and dry. No rash noted.   Cardiovascular: Normal heart rate noted  Respiratory: Normal respiratory effort, no problems with respiration noted  Abdomen: Soft, gravid, appropriate for gestational age.  Pain/Pressure: Absent     Pelvic: Cervical exam deferred        Extremities: Normal range of motion.  Edema: Trace  Mental Status: Normal mood and affect. Normal behavior. Normal judgment and thought content.   Assessment and Plan:  Pregnancy: [id number] at [redacted]w[redacted]d 1. Supervision of other normal pregnancy, antepartum FHT and FH normal  2. Suspected damage to fetus from disease in mother, antepartum condition, single or unspecified fetus Echogenic bowel and cystic structure within  echogenic area. CMV IgG elevated, but IgM negative. Toxoplasmosis negative. Still waiting on CF and NIPs.  Follow up US on 3/22. Depending on growth, may need to refer to Peds surgery for prenatal consult.  3. Uterine scar from previous cesarean delivery affecting pregnancy  4. Herpes genitalis in women Prophylaxis at 35 weeks.  Preterm labor symptoms and general obstetric precautions including but not limited to vaginal bleeding, contractions, leaking of fluid and fetal movement were reviewed in detail with the patient. Please refer to After Visit Summary for other counseling recommendations.   No follow-ups on file.  Future Appointments  Date Time Provider Department Center  04/02/2019 10:30 AM [facility] NURSE [facility]  04/02/2019 10:30 AM [facility] [place] 1 WH-MFCUS MFC-US  04/06/2019 10:15 AM Willodean Rosenthal, MD [facility] None    Levie Heritage, DO

## 2019-03-12 ENCOUNTER — Telehealth (HOSPITAL_COMMUNITY): Payer: Self-pay | Admitting: Genetic Counselor

## 2019-03-12 NOTE — Telephone Encounter (Signed)
I called Ms. Brianna Palmer to discuss her negative carrier screening results for cystic fibrosis (CF). CF carrier screening for 97 common mutations was ordered due to the finding of fetal echogenic bowel on ultrasound. Ms. Brianna Palmer was negative for the mutations analyzed in the CFTR gene associated with CF. Based on this negative result and her ethnic background, she has a 1 in 383 residual risk of being a carrier for CF. Thus, the couple's risk to have a child with CF has been significantly reduced, but not completely eliminated, and CF is unlikely to be the explanation for echogenic bowel seen on ultrasound.  We discussed that Ms. Brianna Palmer's results from MaterniT21 noninvasive prenatal screening (NIPS) are still pending. Unfortunately, several of our samples drawn around the time Ms. Brianna Palmer's sample was collected have been delayed in reaching the testing laboratory due to adverse weather. If her specimen is unable to be successfully processed, Ms. Brianna Palmer indicated that she would be open to having another sample drawn at the time of her next ultrasound. I will continue to keep her updated as I hear from the laboratory regarding her NIPS result.  Ms. Brianna Palmer had several excellent questions during our conversation today. She inquired what next steps would look like should NIPS results be normal. We reviewed that diagnostic testing via amniocentesis is always an option to confirm that the fetus truly does not have a prenatal infection, CF, or a chromosomal abnormality. Otherwise, we will continue to monitor the fetus via ultrasound to determine if the echogenic bowel changes, resolves, or progresses or if any other findings develop that may impact delivery plans or postnatal care. If a gastrointestinal blockage or anomaly is suspected to be the cause of the echogenic bowel, the baby will be assessed postnatally to determine if he has any abnormalities that may need correction. I reminded Ms. Brianna Palmer that echogenic bowel  can be seen in healthy babies and that no other anomalies were seen on her ultrasound on 2/19; however, ultrasound cannot detect all possible fetal anomalies. Ms. Brianna Palmer was reassured by the continued negative results and confirmed that she had no further questions at this time.   Gershon Crane, MS, [facility] Genetic Counselor

## 2019-03-13 ENCOUNTER — Other Ambulatory Visit (HOSPITAL_COMMUNITY): Payer: Self-pay | Admitting: Obstetrics

## 2019-03-14 ENCOUNTER — Telehealth (HOSPITAL_COMMUNITY): Payer: Self-pay | Admitting: Genetic Counselor

## 2019-03-14 LAB — CYSTIC FIBROSIS MUTATION 97: Interpretation: NOT DETECTED

## 2019-03-14 LAB — MATERNIT21 PLUS CORE+SCA

## 2019-03-14 LAB — CMV ABS, IGG+IGM (CYTOMEGALOVIRUS)
CMV Ab - IgG: 9.4 U/mL — ABNORMAL HIGH (ref 0.00–0.59)
CMV IgM Ser EIA-aCnc: <30 AU/mL (ref 0.0–29.9)

## 2019-03-14 LAB — INFECT DISEASE AB IGM REFLEX 1

## 2019-03-14 LAB — TOXOPLASMA GONDII ANTIBODY, IGG: Toxoplasma IgG Ratio: <3 IU/mL (ref 0.0–7.1)

## 2019-03-14 LAB — TOXOPLASMA GONDII ANTIBODY, IGM: Toxoplasma Antibody- IgM: 3.5 AU/mL (ref 0.0–7.9)

## 2019-03-14 NOTE — Telephone Encounter (Signed)
I called [name] to give her an update on her MaterniT21 noninvasive prenatal screening (NIPS). I contacted the testing laboratory today and unfortunately they confirmed that Brianna Palmer's sample has been held up by [employer] due to the inclement weather throughout the [place] in the past few weeks. The laboratory has not yet received her sample, so it will likely be degraded by the time it reaches them. [name] still desires a redraw for that testing but indicated that she cannot take any more time off of work to have another sample drawn. We will plan on redrawing for MaterniT21 NIPS at [name]'s follow-up appointment on 3/22.  Gershon Crane, MS, [facility] Genetic Counselor

## 2019-03-31 IMAGING — US US OB TRANSVAGINAL
1 series · 15 of 28 positions shown · non-contrast
Comparison: 08/28/2016

CLINICAL DATA: First trimester pregnancy with inconclusive fetal
viability.

EXAM:
TRANSVAGINAL OB ULTRASOUND
TECHNIQUE: Transvaginal ultrasound was performed for complete evaluation of the
gestation as well as the maternal uterus, adnexal regions, and
pelvic cul-de-sac.

[Series 1: us ob transvaginal · 15 of 46 slices shown]
[im 1/46]
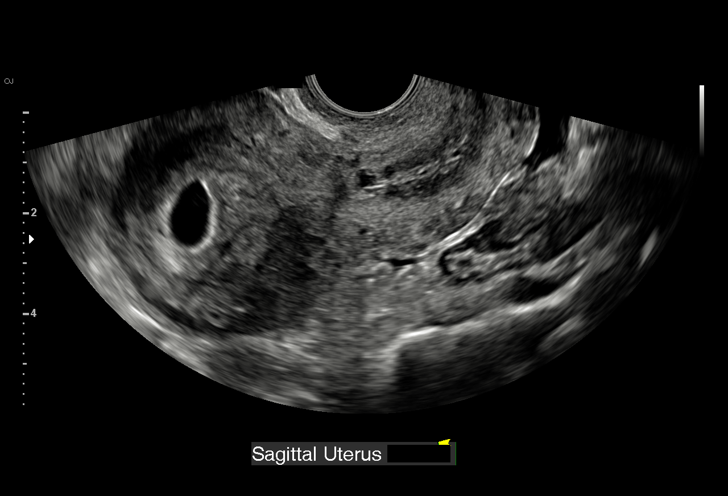
[im 4/46]
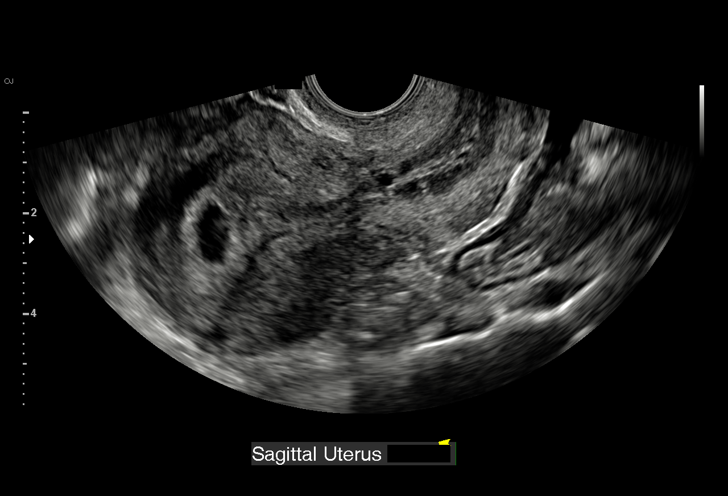
[im 7/46]
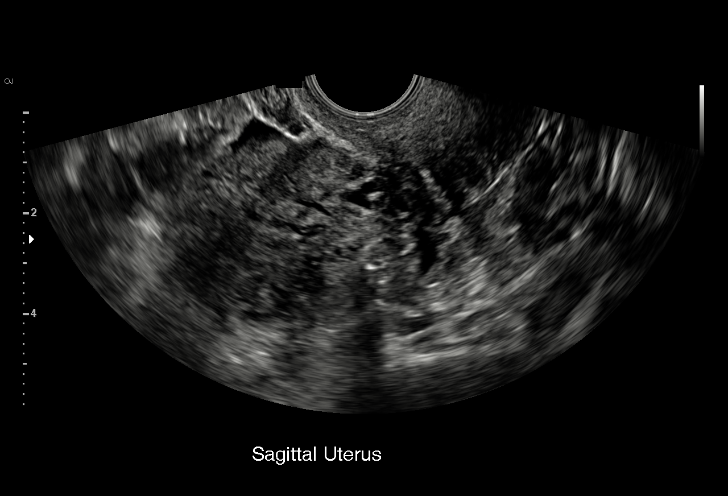
[im 11/46]
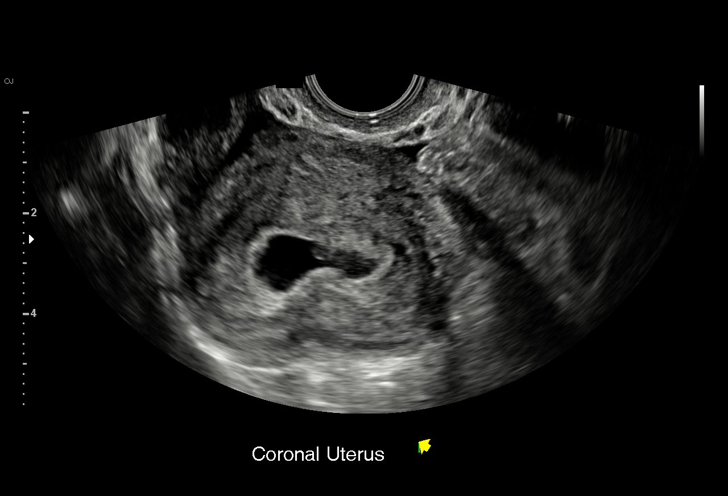
[im 14/46]
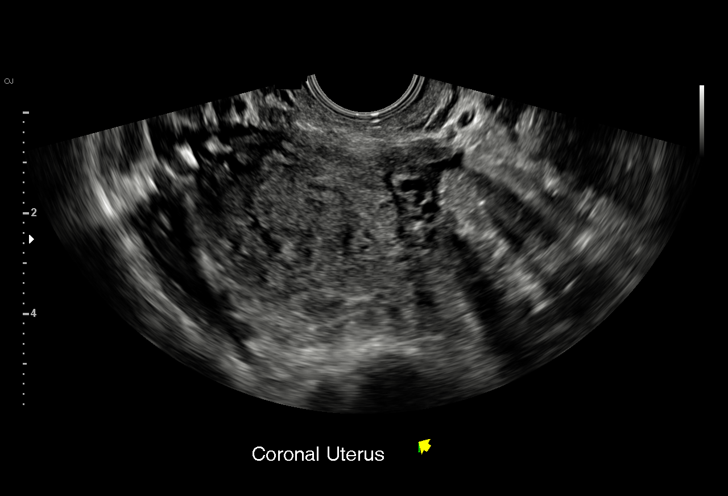
[im 17/46]
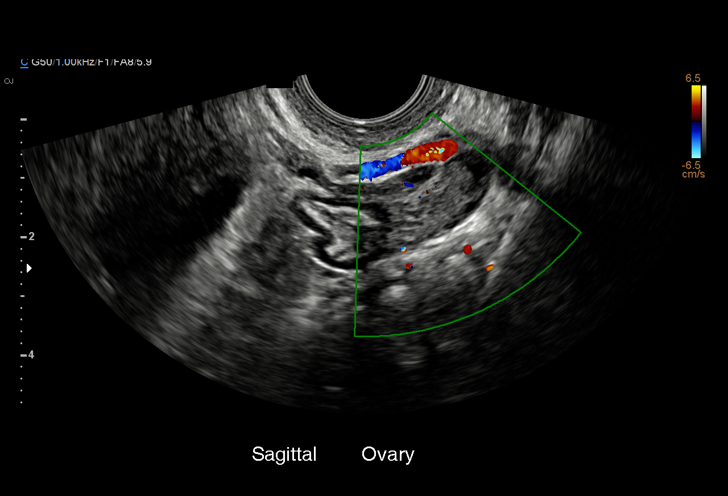
[im 21/46]
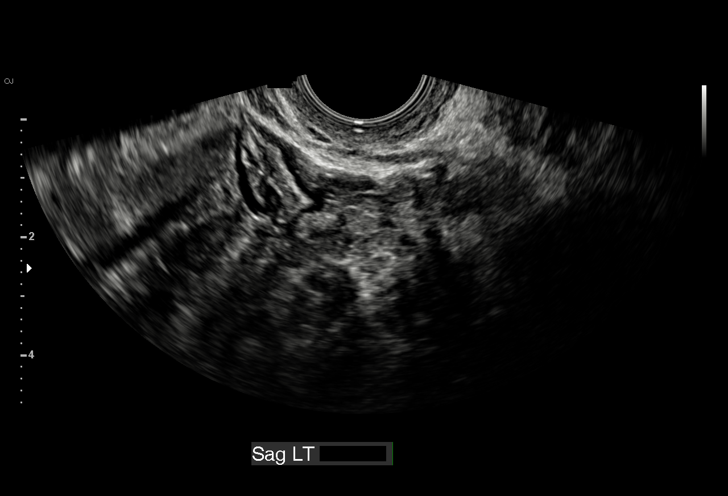
[im 24/46]
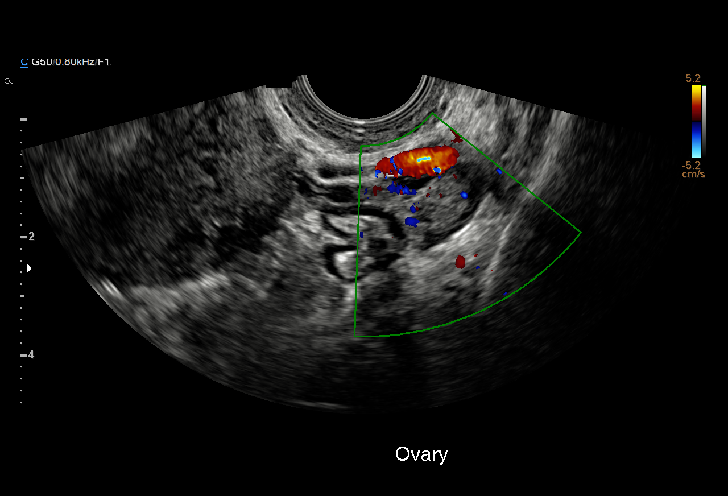
[im 26/46]
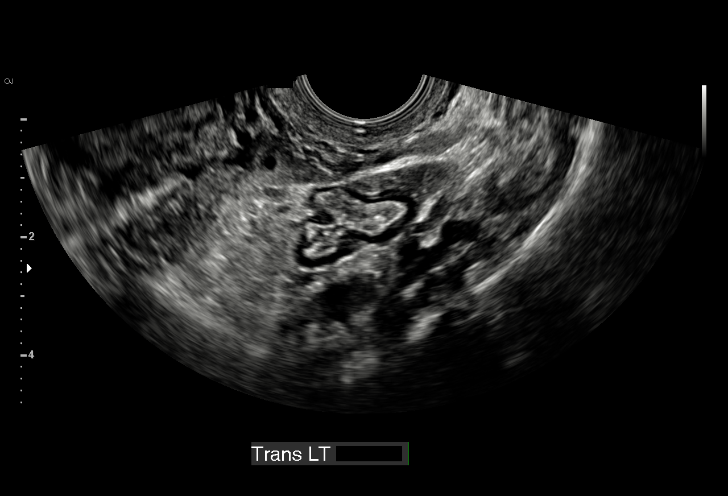
[im 29/46]
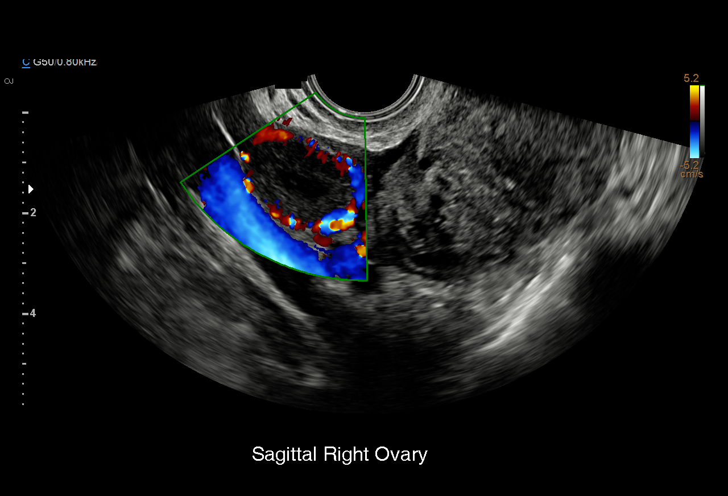
[im 32/46]
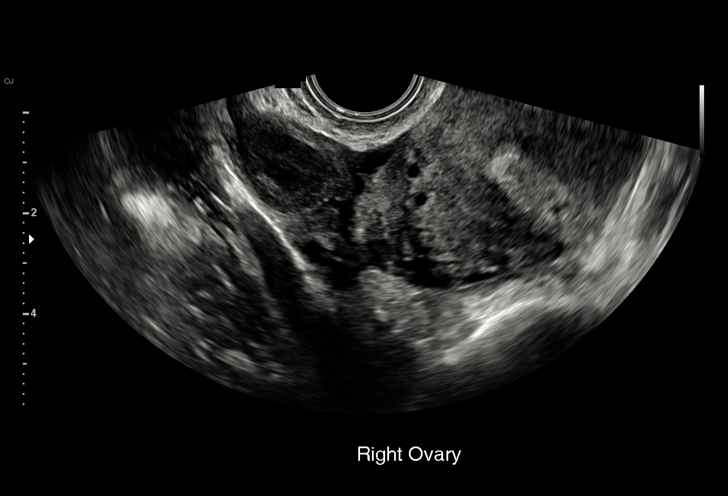
[im 36/46]
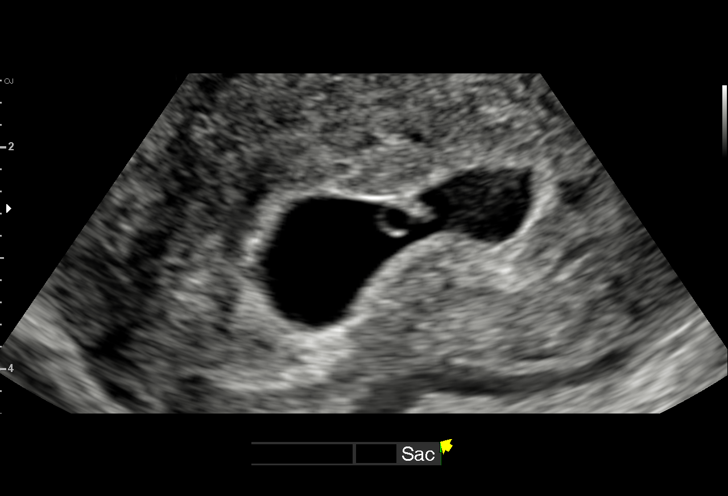
[im 39/46]
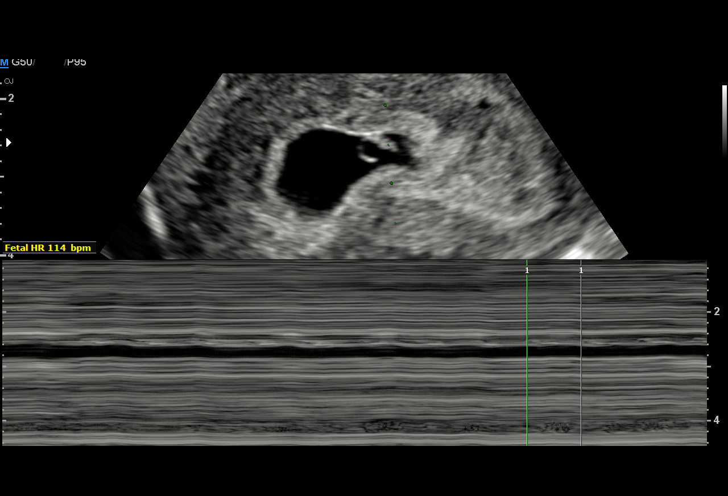
[im 42/46]
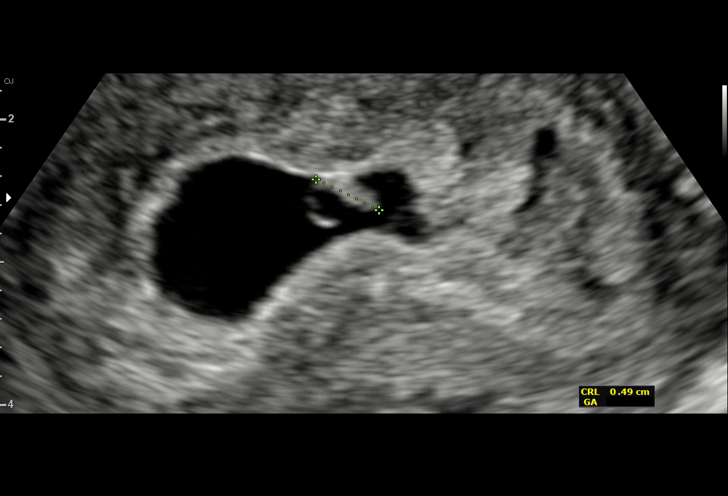
[im 46/46]
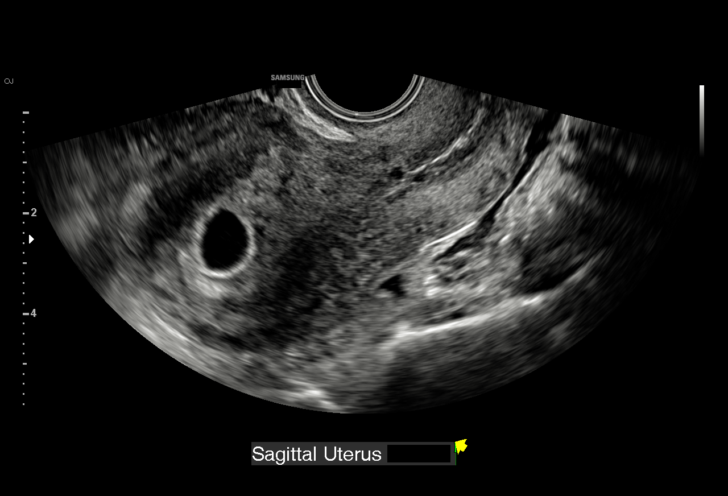

[15 of 28 positions shown; findings below may reference images not displayed]

FINDINGS: Intrauterine gestational sac: Single

Yolk sac:  Visualized.

Embryo:  Visualized.

Cardiac Activity: Visualized.

Heart Rate: 114 bpm

CRL:   5  mm   6 w 1 d                  US EDC: 04/27/2017

Subchorionic hemorrhage:  None visualized.

Maternal uterus/adnexae: Small right ovarian corpus luteum noted.
Normal appearance of left ovary. No mass or abnormal free fluid
identified.
IMPRESSION: Single living IUP measuring 6 weeks 1 day, with US EDC of
04/27/2017.

## 2019-04-02 ENCOUNTER — Other Ambulatory Visit (HOSPITAL_COMMUNITY): Payer: Self-pay

## 2019-04-02 ENCOUNTER — Other Ambulatory Visit (HOSPITAL_COMMUNITY): Payer: Self-pay | Admitting: *Deleted

## 2019-04-02 ENCOUNTER — Ambulatory Visit (HOSPITAL_COMMUNITY): Admission: RE | Admit: 2019-04-02 | Payer: Medicaid Other | Source: Ambulatory Visit

## 2019-04-02 ENCOUNTER — Ambulatory Visit (HOSPITAL_COMMUNITY)
Admission: RE | Admit: 2019-04-02 | Discharge: 2019-04-02 | Disposition: A | Discharge status: Home / Self Care | Payer: Medicaid Other | Source: Ambulatory Visit | Attending: Obstetrics | Admitting: Obstetrics

## 2019-04-02 ENCOUNTER — Other Ambulatory Visit: Payer: Self-pay

## 2019-04-02 ENCOUNTER — Encounter (HOSPITAL_COMMUNITY): Payer: Self-pay

## 2019-04-02 ENCOUNTER — Ambulatory Visit (HOSPITAL_COMMUNITY): Payer: Medicaid Other

## 2019-04-02 ENCOUNTER — Ambulatory Visit (HOSPITAL_COMMUNITY): Payer: Medicaid Other | Admitting: *Deleted

## 2019-04-02 DIAGNOSIS — O358XX0 Maternal care for other (suspected) fetal abnormality and damage, not applicable or unspecified: Secondary | ICD-10-CM | POA: Diagnosis present

## 2019-04-02 DIAGNOSIS — IMO0002 Reserved for concepts with insufficient information to code with codable children: Secondary | ICD-10-CM

## 2019-04-02 DIAGNOSIS — Z3480 Encounter for supervision of other normal pregnancy, unspecified trimester: Secondary | ICD-10-CM | POA: Insufficient documentation

## 2019-04-02 DIAGNOSIS — O99212 Obesity complicating pregnancy, second trimester: Secondary | ICD-10-CM

## 2019-04-02 DIAGNOSIS — O359XX0 Maternal care for (suspected) fetal abnormality and damage, unspecified, not applicable or unspecified: Secondary | ICD-10-CM

## 2019-04-02 DIAGNOSIS — O283 Abnormal ultrasonic finding on antenatal screening of mother: Secondary | ICD-10-CM

## 2019-04-02 DIAGNOSIS — O34219 Maternal care for unspecified type scar from previous cesarean delivery: Secondary | ICD-10-CM

## 2019-04-02 DIAGNOSIS — Z3A23 23 weeks gestation of pregnancy: Secondary | ICD-10-CM | POA: Diagnosis not present

## 2019-04-02 DIAGNOSIS — O35DXX0 Maternal care for other (suspected) fetal abnormality and damage, fetal gastrointestinal anomalies, not applicable or unspecified: Secondary | ICD-10-CM

## 2019-04-03 ENCOUNTER — Inpatient Hospital Stay (HOSPITAL_COMMUNITY): Admission: RE | Admit: 2019-04-03 | Payer: Medicaid Other | Source: Ambulatory Visit

## 2019-04-06 ENCOUNTER — Other Ambulatory Visit: Payer: Self-pay

## 2019-04-06 ENCOUNTER — Ambulatory Visit (INDEPENDENT_AMBULATORY_CARE_PROVIDER_SITE_OTHER): Payer: Medicaid Other | Admitting: Obstetrics & Gynecology

## 2019-04-06 ENCOUNTER — Encounter: Payer: Self-pay | Admitting: Obstetrics & Gynecology

## 2019-04-06 VITALS — BP 111/71 | HR 88 | Wt 180.0 lb

## 2019-04-06 DIAGNOSIS — Z3480 Encounter for supervision of other normal pregnancy, unspecified trimester: Secondary | ICD-10-CM

## 2019-04-06 DIAGNOSIS — G43001 Migraine without aura, not intractable, with status migrainosus: Secondary | ICD-10-CM

## 2019-04-06 DIAGNOSIS — Z3A23 23 weeks gestation of pregnancy: Secondary | ICD-10-CM

## 2019-04-06 DIAGNOSIS — O358XX0 Maternal care for other (suspected) fetal abnormality and damage, not applicable or unspecified: Secondary | ICD-10-CM

## 2019-04-06 DIAGNOSIS — O34219 Maternal care for unspecified type scar from previous cesarean delivery: Secondary | ICD-10-CM

## 2019-04-06 DIAGNOSIS — A6009 Herpesviral infection of other urogenital tract: Secondary | ICD-10-CM

## 2019-04-06 NOTE — Progress Notes (Signed)
   PRENATAL VISIT NOTE  Subjective:  Brianna Palmer is a 34 y.o. V6P0141 at [redacted]w[redacted]d being seen today for ongoing prenatal care.  She is currently monitored for the following issues for this low-risk pregnancy and has Depressive disorder, not elsewhere classified; Herpes genitalis in women; Migraine without aura and with status migrainosus, not intractable; Uterine scar from previous cesarean delivery affecting pregnancy; Supervision of other normal pregnancy, antepartum; and Suspected damage to fetus from other disease in mother, affecting management of mother, antepartum condition or complication on their problem list.  Patient reports fatigue and decreased energy. .  Contractions: Not present. Vag. Bleeding: None.  Movement: Present. Denies leaking of fluid.   The following portions of the patient's history were reviewed and updated as appropriate: allergies, current medications, past family history, past medical history, past social history, past surgical history and problem list.   Objective:   Vitals:   04/06/19 1020  BP: 111/71  Pulse: 88  Weight: 180 lb (81.6 kg)    Fetal Status: Fetal Heart Rate (bpm): 151 Fundal Height: 24 cm Movement: Present     General:  Alert, oriented and cooperative. Patient is in no acute distress.  Skin: Skin is warm and dry. No rash noted.   Cardiovascular: Normal heart rate noted  Respiratory: Normal respiratory effort, no problems with respiration noted  Abdomen: Soft, gravid, appropriate for gestational age.  Pain/Pressure: Absent     Pelvic: Cervical exam deferred        Extremities: Normal range of motion.  Edema: Trace  Mental Status: Normal mood and affect. Normal behavior. Normal judgment and thought content.   Assessment and Plan:  Pregnancy: [id number] at [redacted]w[redacted]d 1. Supervision of other normal pregnancy, antepartum Normal FHR and FH 2 hour GTT next visit.   2. Suspected damage to fetus from disease in mother, antepartum condition, single or  unspecified fetus Awaiting genetic testing results  S/p MFM consult and genetics consult  3. Uterine scar from previous cesarean delivery affecting pregnancy Need to review mode of delivery options next viist  4. Migraine without aura and with status migrainosus, not intractable  5. Herpes genitalis in women Begin suppression at 36 weeks  Preterm labor symptoms and general obstetric precautions including but not limited to vaginal bleeding, contractions, leaking of fluid and fetal movement were reviewed in detail with the patient. Please refer to After Visit Summary for other counseling recommendations.   Return in about 4 weeks (around 05/04/2019) for in person.  Future Appointments  Date Time Provider Department Center  05/02/2019  3:45 PM [facility] NURSE [facility]  05/02/2019  3:45 PM [facility] [place] 3 [facility]    Willodean Rosenthal, MD

## 2019-04-07 LAB — MATERNIT21 PLUS CORE+SCA
Fetal Fraction: 9
Monosomy X (Turner Syndrome): NOT DETECTED
Result (T21): NEGATIVE
Trisomy 13 (Patau syndrome): NEGATIVE
Trisomy 18 (Edwards syndrome): NEGATIVE
Trisomy 21 (Down syndrome): NEGATIVE
XXX (Triple X Syndrome): NOT DETECTED
XXY (Klinefelter Syndrome): NOT DETECTED
XYY (Jacobs Syndrome): NOT DETECTED

## 2019-04-09 ENCOUNTER — Telehealth (HOSPITAL_COMMUNITY): Payer: Self-pay | Admitting: Genetic Counselor

## 2019-04-09 NOTE — Telephone Encounter (Signed)
I called Ms. Suits to discuss her negative noninvasive prenatal screening (NIPS)/cell free DNA (cfDNA) testing result. Specifically, Ms. Weil had MaterniT21 testing through [employer]. Testing was offered because of echogenic bowel noted on ultrasound. These negative results demonstrated an expected representation of chromosome 21, 18, 13, and sex chromosome material, greatly reducing the likelihood of trisomies 38, 80, or 65 and sex chromosome aneuploidies for the pregnancy.   NIPS analyzes placental (fetal) DNA in maternal circulation. NIPS is considered to be highly specific and sensitive, but is not considered to be a diagnostic test. This testing identifies 91-99% of pregnancies with trisomies 21, 13, and 18, as well as sex chromosome abnormalities, but does not test for all genetic conditions. Diagnostic testing via amniocentesis is available should Ms. Seipp be interested in confirming this result.   We reviewed that cystic fibrosis, a prenatal infection, and chromosomal aneuploidies are likely not the cause for the echogenic bowel noted on ultrasound given all of her negative testing results. We will continue to follow Ms. Bos via ultrasound throughout the remainder of the pregnancy. If a gastrointestinal blockage or anomaly is suspected, postnatal imaging could be performed to characterize and/or identify the abnormality. However, we again reviewed that echogenic bowel can be a normal variant seen in healthy babies and that we may never identify a cause for the echogenic bowel. Ms. Whitsitt was extremely relieved to hear of her normal NIPS result and confirmed that she had no further questions at this time.  Gershon Crane, MS Genetic Counselor

## 2019-04-19 ENCOUNTER — Telehealth: Payer: Self-pay

## 2019-04-19 NOTE — Telephone Encounter (Signed)
Pt called stating wanting to know what is safe to take for allergies. Pt states her mucus is green and she's not sure she has a sinus infection. Pt was given list of safe medications and advised to call the office if symptoms persists. Understanding was voiced. Filippa Yarbough l Amaiya Scruton, CMA

## 2019-05-02 ENCOUNTER — Ambulatory Visit (HOSPITAL_COMMUNITY)
Admission: RE | Admit: 2019-05-02 | Discharge: 2019-05-02 | Disposition: A | Discharge status: Home / Self Care | Payer: Medicaid Other | Source: Ambulatory Visit | Attending: Obstetrics and Gynecology | Admitting: Obstetrics and Gynecology

## 2019-05-02 ENCOUNTER — Ambulatory Visit (HOSPITAL_COMMUNITY): Payer: Medicaid Other | Admitting: *Deleted

## 2019-05-02 ENCOUNTER — Other Ambulatory Visit: Payer: Self-pay

## 2019-05-02 ENCOUNTER — Other Ambulatory Visit (HOSPITAL_COMMUNITY): Payer: Self-pay | Admitting: Obstetrics

## 2019-05-02 ENCOUNTER — Encounter (HOSPITAL_COMMUNITY): Payer: Self-pay

## 2019-05-02 DIAGNOSIS — IMO0002 Reserved for concepts with insufficient information to code with codable children: Secondary | ICD-10-CM

## 2019-05-02 DIAGNOSIS — O34219 Maternal care for unspecified type scar from previous cesarean delivery: Secondary | ICD-10-CM

## 2019-05-02 DIAGNOSIS — O358XX0 Maternal care for other (suspected) fetal abnormality and damage, not applicable or unspecified: Secondary | ICD-10-CM

## 2019-05-02 DIAGNOSIS — Z362 Encounter for other antenatal screening follow-up: Secondary | ICD-10-CM

## 2019-05-02 DIAGNOSIS — O365920 Maternal care for other known or suspected poor fetal growth, second trimester, not applicable or unspecified: Secondary | ICD-10-CM

## 2019-05-02 DIAGNOSIS — Z3480 Encounter for supervision of other normal pregnancy, unspecified trimester: Secondary | ICD-10-CM

## 2019-05-02 DIAGNOSIS — O359XX0 Maternal care for (suspected) fetal abnormality and damage, unspecified, not applicable or unspecified: Secondary | ICD-10-CM

## 2019-05-02 DIAGNOSIS — O283 Abnormal ultrasonic finding on antenatal screening of mother: Secondary | ICD-10-CM | POA: Diagnosis not present

## 2019-05-02 DIAGNOSIS — Z3A27 27 weeks gestation of pregnancy: Secondary | ICD-10-CM

## 2019-05-03 ENCOUNTER — Inpatient Hospital Stay (HOSPITAL_COMMUNITY)
Admission: AD | Admit: 2019-05-03 | Discharge: 2019-05-03 | Disposition: A | Discharge status: Home / Self Care | Payer: Medicaid Other | Attending: Family Medicine | Admitting: Family Medicine

## 2019-05-03 ENCOUNTER — Encounter (HOSPITAL_COMMUNITY): Payer: Self-pay | Admitting: Family Medicine

## 2019-05-03 DIAGNOSIS — Z3480 Encounter for supervision of other normal pregnancy, unspecified trimester: Secondary | ICD-10-CM

## 2019-05-03 DIAGNOSIS — O26892 Other specified pregnancy related conditions, second trimester: Secondary | ICD-10-CM | POA: Diagnosis not present

## 2019-05-03 DIAGNOSIS — Z3A27 27 weeks gestation of pregnancy: Secondary | ICD-10-CM

## 2019-05-03 DIAGNOSIS — O99891 Other specified diseases and conditions complicating pregnancy: Secondary | ICD-10-CM

## 2019-05-03 DIAGNOSIS — Z87891 Personal history of nicotine dependence: Secondary | ICD-10-CM | POA: Diagnosis not present

## 2019-05-03 DIAGNOSIS — O365990 Maternal care for other known or suspected poor fetal growth, unspecified trimester, not applicable or unspecified: Secondary | ICD-10-CM

## 2019-05-03 DIAGNOSIS — G441 Vascular headache, not elsewhere classified: Secondary | ICD-10-CM

## 2019-05-03 DIAGNOSIS — O34219 Maternal care for unspecified type scar from previous cesarean delivery: Secondary | ICD-10-CM

## 2019-05-03 DIAGNOSIS — R030 Elevated blood-pressure reading, without diagnosis of hypertension: Secondary | ICD-10-CM | POA: Diagnosis present

## 2019-05-03 LAB — CBC
HCT: 39.3 % (ref 36.0–46.0)
Hemoglobin: 13.6 g/dL (ref 12.0–15.0)
MCH: 29.9 pg (ref 26.0–34.0)
MCHC: 34.6 g/dL (ref 30.0–36.0)
MCV: 86.4 fL (ref 80.0–100.0)
Platelets: 271 10*3/uL (ref 150–400)
RBC: 4.55 MIL/uL (ref 3.87–5.11)
RDW: 13.4 % (ref 11.5–15.5)
WBC: 10.7 10*3/uL — ABNORMAL HIGH (ref 4.0–10.5)
nRBC: 0.2 % (ref 0.0–0.2)

## 2019-05-03 LAB — COMPREHENSIVE METABOLIC PANEL
ALT: 18 U/L (ref 0–44)
AST: 21 U/L (ref 15–41)
Albumin: 2.7 g/dL — ABNORMAL LOW (ref 3.5–5.0)
Alkaline Phosphatase: 76 U/L (ref 38–126)
Anion gap: 8 (ref 5–15)
BUN: 9 mg/dL (ref 6–20)
CO2: 22 mmol/L (ref 22–32)
Calcium: 9.1 mg/dL (ref 8.9–10.3)
Chloride: 107 mmol/L (ref 98–111)
Creatinine, Ser: 0.74 mg/dL (ref 0.44–1.00)
GFR calc Af Amer: >60 mL/min (ref >60)
GFR calc non Af Amer: >60 mL/min (ref >60)
Glucose, Bld: 73 mg/dL (ref 70–99)
Potassium: 3.8 mmol/L (ref 3.5–5.1)
Sodium: 137 mmol/L (ref 135–145)
Total Bilirubin: 0.2 mg/dL — ABNORMAL LOW (ref 0.3–1.2)
Total Protein: 7 g/dL (ref 6.5–8.1)

## 2019-05-03 LAB — URINALYSIS, ROUTINE W REFLEX MICROSCOPIC
Bilirubin Urine: NEGATIVE
Glucose, UA: NEGATIVE mg/dL
Hgb urine dipstick: NEGATIVE
Ketones, ur: NEGATIVE mg/dL
Leukocytes,Ua: NEGATIVE
Nitrite: NEGATIVE
Protein, ur: NEGATIVE mg/dL
Specific Gravity, Urine: 1.009 (ref 1.005–1.030)
pH: 6 (ref 5.0–8.0)

## 2019-05-03 LAB — PROTEIN / CREATININE RATIO, URINE
Creatinine, Urine: 54.18 mg/dL
Protein Creatinine Ratio: 0.15 mg/mg{Cre} (ref 0.00–0.15)
Total Protein, Urine: 8 mg/dL

## 2019-05-03 MED ORDER — BUTALBITAL-APAP-CAFFEINE 50-325-40 MG PO CAPS: 1.0000 | ORAL_CAPSULE | Freq: Four times a day (QID) | ORAL | 3 refills | Status: DC | PRN

## 2019-05-03 MED ORDER — BUTALBITAL-APAP-CAFFEINE 50-325-40 MG PO TABS
2.0000 | ORAL_TABLET | Freq: Once | ORAL | Status: AC
  Administered 2019-05-03: 2 via ORAL
  Filled 2019-05-03: qty 2

## 2019-05-03 NOTE — Discharge Instructions (Signed)
Tension Headache, Adult A tension headache is pain, pressure, or aching in your head. Tension headaches can last from 30 minutes to several days. Follow these instructions at home: Managing pain  Take over-the-counter and prescription medicines only as told by your doctor.  When you have a headache, lie down in a dark, quiet room.  If told, put ice on your head and neck: ? Put ice in a plastic bag. ? Place a towel between your skin and the bag. ? Leave the ice on for 20 minutes, 2-3 times a day.  If told, put heat on the back of your neck. Do this as often as your doctor tells you to. Use the kind of heat that your doctor recommends, such as a moist heat pack or a heating pad. ? Place a towel between your skin and the heat. ? Leave the heat on for 20-30 minutes. ? Remove the heat if your skin turns bright red. Eating and drinking  Eat meals on a regular schedule.  Watch how much alcohol you drink: ? If you are a woman and are not pregnant, do not drink more than 1 drink a day. ? If you are a man, do not drink more than 2 drinks a day.  Drink enough fluid to keep your pee (urine) pale yellow.  Do not use a lot of caffeine, or stop using caffeine. Lifestyle  Get enough sleep. Get 7-9 hours of sleep each night. Or get the amount of sleep that your doctor tells you to.  At bedtime, remove all electronic devices from your room. Examples of electronic devices are computers, phones, and tablets.  Find ways to lessen your stress. Some things that can lessen stress are: ? Exercise. ? Deep breathing. ? Yoga. ? Music. ? Positive thoughts.  Sit up straight. Do not tighten (tense) your muscles.  Do not use any products that have nicotine or tobacco in them, such as cigarettes and e-cigarettes. If you need help quitting, ask your doctor. General instructions   Keep all follow-up visits as told by your doctor. This is important.  Avoid things that can bring on headaches. Keep a  journal to find out if certain things bring on headaches. For example, write down: ? What you eat and drink. ? How much sleep you get. ? Any change to your diet or medicines. Contact a doctor if:  Your headache does not get better.  Your headache comes back.  You have a headache and sounds, light, or smells bother you.  You feel sick to your stomach (nauseous) or you throw up (vomit).  Your stomach hurts. Get help right away if:  You suddenly get a very bad headache along with any of these: ? A stiff neck. ? Feeling sick to your stomach. ? Throwing up. ? Feeling weak. ? Trouble seeing. ? Feeling short of breath. ? A rash. ? Feeling unusually sleepy. ? Trouble speaking. ? Pain in your eye or ear. ? Trouble walking or balancing. ? Feeling like you will pass out (faint). ? Passing out. Summary  A tension headache is pain, pressure, or aching in your head.  Tension headaches can last from 30 minutes to several days.  Lifestyle changes and medicines may help relieve pain. This information is not intended to replace advice given to you by your health care provider. Make sure you discuss any questions you have with your health care provider. Document Revised: 10/25/2018 Document Reviewed: 04/09/2016 Elsevier Patient Education  2020 Elsevier Inc.  

## 2019-05-03 NOTE — MAU Provider Note (Addendum)
Patient Brianna Palmer is a 34 y.o. [id number]  at [redacted]w[redacted]d here with complaints of headache and elevated blood pressure at home. Blood pressure was 150/93 and 125/92 at home. She denies blurry vision, nausea, floating spots, decreased fetal movements, VB, loss of fluid. Patient reports significant stress after learning that fetus is FGR in third percentile (diagnosed yesterday) and has echogenic bowel and cystic structure within echogenic area.    Her history is significant for 2 c/sections; she denies any other medical history.    History     CSN: [id number]  Arrival date and time: 05/03/19 0957   None     Chief Complaint  Patient presents with  . Headache   Headache  This is a new problem. The current episode started yesterday. The problem has been unchanged. The quality of the pain is described as dull, aching and pulsating. The pain is at a severity of 6/10. Pertinent negatives include no blurred vision, fever, nausea, visual change or vomiting.  She did not sleep well last night; she has received some distressing news about her fetus and feels "foggy".   OB History    Gravida  6   Para  2   Term  2   Preterm      AB  3   Living  2     SAB  2   TAB  1   Ectopic      Multiple  0   Live Births  2           Past Medical History:  Diagnosis Date  . Headache   . HSV infection     Past Surgical History:  Procedure Laterality Date  . BUNIONECTOMY    . CESAREAN SECTION N/A 03/30/2012   Procedure: CESAREAN SECTION;  Surgeon: Antionette Char, MD;  Location: [facility];  Service: Obstetrics;  Laterality: N/A;  Primary Cesarean Section Delivery Baby Boy @ 0157, Apgars 8/9  . CESAREAN SECTION N/A 04/22/2017   Procedure: CESAREAN SECTION;  Surgeon: Lesly Dukes, MD;  Location: [facility];  Service: Obstetrics;  Laterality: N/A;    Family History  Adopted: Yes  Problem Relation Age of Onset  . Other Neg Hx   . Cancer Neg Hx   . Hypertension Neg Hx   .  Stroke Neg Hx     Social History   Tobacco Use  . Smoking status: Former Smoker    Packs/day: 0.25    Types: Cigarettes    Quit date: 07/01/2013    Years since quitting: 5.8  . Smokeless tobacco: Never Used  Substance Use Topics  . Alcohol use: No    Alcohol/week: 0.0 standard drinks    Comment: socially  . Drug use: No    Allergies: No Known Allergies  No medications prior to admission.    Review of Systems  Constitutional: Negative for fever.  Eyes: Negative.  Negative for blurred vision.  Cardiovascular: Negative.   Gastrointestinal: Negative for nausea and vomiting.  Musculoskeletal: Negative.   Allergic/Immunologic: Negative.   Neurological: Positive for headaches.   Physical Exam   Blood pressure 119/84, pulse (!) 106, temperature 98.5 F (36.9 C), temperature source Oral, resp. rate 18, height 5\' 3"  (1.6 m), weight 85.3 kg, SpO2 100 %.  Physical Exam  Constitutional: She appears well-developed.  HENT:  Head: Normocephalic.  Respiratory: Effort normal.  GI: She exhibits no distension and no mass. There is no abdominal tenderness. There is no rebound and no guarding.  No RUQ pain  Musculoskeletal:        General: Normal range of motion.     Cervical back: Normal range of motion.  Neurological: She is alert.  Skin: Skin is warm.    MAU Course  Procedures  MDM -NST: 135 bpm, mod var, present acel, neg decels, no contractions -will draw pre-e labs; BPs slightly elevated (x 1) in MAU but not other concerning symptoms> CBC, CMP and PCR are normal. UA is normal, no signs of dehydration to explain HA.  -HA now a 2/10 with fioricet.   Patient Vitals for the past 24 hrs:  BP Temp Temp src Pulse Resp SpO2 Height Weight  05/03/19 1355 119/84 -- -- (!) 106 -- -- -- --  05/03/19 1301 111/74 -- -- 86 -- -- -- --  05/03/19 1246 113/79 -- -- 91 -- -- -- --  05/03/19 1231 115/70 -- -- 86 -- -- -- --  05/03/19 1216 106/61 -- -- 84 -- 100 % -- --  05/03/19 1211 --  -- -- -- -- 100 % -- --  05/03/19 1206 -- -- -- -- -- 100 % -- --  05/03/19 1201 111/68 -- -- 84 -- 100 % -- --  05/03/19 1156 -- -- -- -- -- 100 % -- --  05/03/19 1146 117/68 -- -- 81 -- 100 % -- --  05/03/19 1141 -- -- -- -- -- 100 % -- --  05/03/19 1136 -- -- -- -- -- 99 % -- --  05/03/19 1131 117/69 -- -- 87 -- 100 % -- --  05/03/19 1126 -- -- -- -- -- 100 % -- --  05/03/19 1121 -- -- -- -- -- 100 % -- --  05/03/19 1116 117/72 -- -- 93 -- 100 % -- --  05/03/19 1111 -- -- -- -- -- 100 % -- --  05/03/19 1106 -- -- -- -- -- 100 % -- --  05/03/19 1105 109/73 -- -- 85 -- -- -- --  05/03/19 1101 115/76 -- -- 89 -- 100 % -- --  05/03/19 1056 -- -- -- -- -- 99 % -- --  05/03/19 1052 -- -- -- -- -- 100 % -- --  05/03/19 1051 -- -- -- -- -- 100 % -- --  05/03/19 1046 -- -- -- -- -- 100 % -- --  05/03/19 1043 124/89 -- -- 92 -- -- -- --  05/03/19 1041 -- -- -- -- -- 99 % -- --  05/03/19 1013 134/90 98.5 F (36.9 C) Oral (!) 101 18 99 % 5\' 3"  (1.6 m) 85.3 kg     Assessment and Plan   1. Other vascular headache   2. Supervision of other normal pregnancy, antepartum   3. Uterine scar from previous cesarean delivery affecting pregnancy    2. Patient stable for discharge with recommendation to check blood pressure at home when she feels like she is having a headache; return to MAU if elevated pressures or if HA not relieved by Tylenol, hydration or rest.  Return to MAU if develops elevated pressures and floating spots, severe RUQ pain, other signs/symptoms of pre-e or decreased fetal movements. Marland Kitchen Keep follow up appt tomorrow and all MFM visits. Continue to watch BP at weekly appointments for signs of developing pre-e or GHTN.   3. All questions answered, patient stable for discharge.   Mervyn Skeeters Erabella Kuipers 05/03/2019, 2:14 PM

## 2019-05-03 NOTE — MAU Note (Signed)
Pt has had headache most of night. Tried eating and headache went away for awhile, but has come back. Took several BP and got several 150s - 90s.  Also says she feels "super tired" Denies vision changes and RUQ or swelling. Learned fetus is IUGR yesterday. Denies contractions, LOF, or VB. Feels decreased fetal movement.

## 2019-05-04 ENCOUNTER — Ambulatory Visit (INDEPENDENT_AMBULATORY_CARE_PROVIDER_SITE_OTHER): Payer: Medicaid Other | Admitting: Obstetrics & Gynecology

## 2019-05-04 ENCOUNTER — Other Ambulatory Visit (HOSPITAL_COMMUNITY): Payer: Self-pay | Admitting: *Deleted

## 2019-05-04 ENCOUNTER — Telehealth: Payer: Self-pay | Admitting: Obstetrics & Gynecology

## 2019-05-04 ENCOUNTER — Other Ambulatory Visit: Payer: Self-pay

## 2019-05-04 VITALS — BP 123/79 | HR 95 | Wt 186.0 lb

## 2019-05-04 DIAGNOSIS — G43001 Migraine without aura, not intractable, with status migrainosus: Secondary | ICD-10-CM

## 2019-05-04 DIAGNOSIS — O0990 Supervision of high risk pregnancy, unspecified, unspecified trimester: Secondary | ICD-10-CM

## 2019-05-04 DIAGNOSIS — O99352 Diseases of the nervous system complicating pregnancy, second trimester: Secondary | ICD-10-CM

## 2019-05-04 DIAGNOSIS — O98312 Other infections with a predominantly sexual mode of transmission complicating pregnancy, second trimester: Secondary | ICD-10-CM

## 2019-05-04 DIAGNOSIS — O365920 Maternal care for other known or suspected poor fetal growth, second trimester, not applicable or unspecified: Secondary | ICD-10-CM

## 2019-05-04 DIAGNOSIS — O358XX0 Maternal care for other (suspected) fetal abnormality and damage, not applicable or unspecified: Secondary | ICD-10-CM

## 2019-05-04 DIAGNOSIS — O34219 Maternal care for unspecified type scar from previous cesarean delivery: Secondary | ICD-10-CM

## 2019-05-04 DIAGNOSIS — O365930 Maternal care for other known or suspected poor fetal growth, third trimester, not applicable or unspecified: Secondary | ICD-10-CM

## 2019-05-04 DIAGNOSIS — Z3A27 27 weeks gestation of pregnancy: Secondary | ICD-10-CM

## 2019-05-04 DIAGNOSIS — O365990 Maternal care for other known or suspected poor fetal growth, unspecified trimester, not applicable or unspecified: Secondary | ICD-10-CM

## 2019-05-04 DIAGNOSIS — A6009 Herpesviral infection of other urogenital tract: Secondary | ICD-10-CM

## 2019-05-04 NOTE — Progress Notes (Signed)
   PRENATAL VISIT NOTE  Subjective:  Brianna Palmer is a 34 y.o. Q2V9563 at [redacted]w[redacted]d being seen today for ongoing prenatal care.  She is currently monitored for the following issues for this high-risk pregnancy and has Depressive disorder, not elsewhere classified; Herpes genitalis in women; Migraine without aura and with status migrainosus, not intractable; Uterine scar from previous cesarean delivery affecting pregnancy; Supervision of high risk pregnancy, antepartum; Suspected damage to fetus from other disease in mother, affecting management of mother, antepartum condition or complication; and Pregnancy affected by fetal growth restriction on their problem list.  Patient reports no complaints.  Contractions: Irritability. Vag. Bleeding: None.  Movement: Present. Denies leaking of fluid.   The following portions of the patient's history were reviewed and updated as appropriate: allergies, current medications, past family history, past medical history, past social history, past surgical history and problem list.   Objective:   Vitals:   05/04/19 1106  BP: 123/79  Pulse: 95  Weight: 186 lb (84.4 kg)    Fetal Status: Fetal Heart Rate (bpm): 143   Movement: Present     General:  Alert, oriented and cooperative. Patient is in no acute distress.  Skin: Skin is warm and dry. No rash noted.   Cardiovascular: Normal heart rate noted  Respiratory: Normal respiratory effort, no problems with respiration noted  Abdomen: Soft, gravid, appropriate for gestational age.  Pain/Pressure: Absent     Pelvic: Cervical exam deferred        Extremities: Normal range of motion.  Edema: Trace  Mental Status: Normal mood and affect. Normal behavior. Normal judgment and thought content.   Assessment and Plan:  Pregnancy: [id number] at [redacted]w[redacted]d 1. Supervision of high risk pregnancy, antepartum Pt is very concerned about the infants growth retstiction. Needs GTT today I received a msg that pt was unhappy with her  care. I discussed this with her today. She reports that she is just stressed this pregnancy and is happy that we brought this up for further discussion. No specific issues to discuss. I invited her to transfer offices if she desired but, she declined stating that her other pregnancies were WNL and this one is worrying her.      2. Pregnancy affected by fetal growth restriction 05/02/2019  Est. FW:     876  gm    1 lb 15 oz       3  %  Umbilical artery Doppler showed normal forward  diastolic flow and occasionally increased S/D ratio.  Appears normal and there is no evidence of previa or accreta.  3. Suspected damage to fetus from disease in mother, antepartum condition, single or unspecified fetus  4. Uterine scar from previous cesarean delivery affecting pregnancy  5. Migraine without aura and with status migrainosus, not intractable  6. Herpes genitalis in women Begin anti viral at at 36 weeks or sooner prn   Preterm labor symptoms and general obstetric precautions including but not limited to vaginal bleeding, contractions, leaking of fluid and fetal movement were reviewed in detail with the patient. Please refer to After Visit Summary for other counseling recommendations.   Return in about 2 weeks (around 05/18/2019) for in person.  Future Appointments  Date Time Provider Department Center  05/09/2019  7:45 AM [facility] [place] 5 [facility]  05/18/2019  7:30 AM [facility] [place] 1 [facility]-US  05/18/2019  8:45 AM [facility] NST [facility]  05/25/2019 11:30 AM [facility] [place] 3 [facility]    Willodean Rosenthal, MD

## 2019-05-05 LAB — GLUCOSE TOLERANCE, 2 HOURS W/ 1HR
Glucose, 1 hour: 123 mg/dL (ref 65–179)
Glucose, 2 hour: 110 mg/dL (ref 65–152)
Glucose, Fasting: 71 mg/dL (ref 65–91)

## 2019-05-05 LAB — CBC
Hematocrit: 39.2 % (ref 34.0–46.6)
Hemoglobin: 13.6 g/dL (ref 11.1–15.9)
MCH: 30.2 pg (ref 26.6–33.0)
MCHC: 34.7 g/dL (ref 31.5–35.7)
MCV: 87 fL (ref 79–97)
Platelets: 293 10*3/uL (ref 150–450)
RBC: 4.5 x10E6/uL (ref 3.77–5.28)
RDW: 13.9 % (ref 11.7–15.4)
WBC: 10.3 10*3/uL (ref 3.4–10.8)

## 2019-05-05 LAB — HIV ANTIBODY (ROUTINE TESTING W REFLEX): HIV Screen 4th Generation wRfx: NONREACTIVE

## 2019-05-05 LAB — RPR: RPR Ser Ql: NONREACTIVE

## 2019-05-09 ENCOUNTER — Other Ambulatory Visit: Payer: Self-pay

## 2019-05-09 ENCOUNTER — Ambulatory Visit (HOSPITAL_COMMUNITY): Payer: Medicaid Other | Admitting: *Deleted

## 2019-05-09 ENCOUNTER — Ambulatory Visit (HOSPITAL_COMMUNITY)
Admission: RE | Admit: 2019-05-09 | Discharge: 2019-05-09 | Disposition: A | Discharge status: Home / Self Care | Payer: Medicaid Other | Source: Ambulatory Visit | Attending: Obstetrics and Gynecology | Admitting: Obstetrics and Gynecology

## 2019-05-09 ENCOUNTER — Encounter (HOSPITAL_COMMUNITY): Payer: Self-pay

## 2019-05-09 DIAGNOSIS — O365930 Maternal care for other known or suspected poor fetal growth, third trimester, not applicable or unspecified: Secondary | ICD-10-CM | POA: Diagnosis present

## 2019-05-09 DIAGNOSIS — O0990 Supervision of high risk pregnancy, unspecified, unspecified trimester: Secondary | ICD-10-CM | POA: Diagnosis present

## 2019-05-09 DIAGNOSIS — O358XX0 Maternal care for other (suspected) fetal abnormality and damage, not applicable or unspecified: Secondary | ICD-10-CM

## 2019-05-09 DIAGNOSIS — O359XX0 Maternal care for (suspected) fetal abnormality and damage, unspecified, not applicable or unspecified: Secondary | ICD-10-CM

## 2019-05-09 DIAGNOSIS — Z3A28 28 weeks gestation of pregnancy: Secondary | ICD-10-CM

## 2019-05-09 DIAGNOSIS — O365920 Maternal care for other known or suspected poor fetal growth, second trimester, not applicable or unspecified: Secondary | ICD-10-CM

## 2019-05-09 DIAGNOSIS — O283 Abnormal ultrasonic finding on antenatal screening of mother: Secondary | ICD-10-CM | POA: Diagnosis present

## 2019-05-09 DIAGNOSIS — O34219 Maternal care for unspecified type scar from previous cesarean delivery: Secondary | ICD-10-CM | POA: Insufficient documentation

## 2019-05-09 DIAGNOSIS — O99212 Obesity complicating pregnancy, second trimester: Secondary | ICD-10-CM

## 2019-05-09 DIAGNOSIS — E669 Obesity, unspecified: Secondary | ICD-10-CM

## 2019-05-09 NOTE — Telephone Encounter (Signed)
Pt called. She did not come in for her appointment. She forgot that she had a visit. She will come in now.   Brianna Palmer, M.D., Evern Core

## 2019-05-09 NOTE — Procedures (Signed)
Brianna Palmer 1985-12-23 [redacted]w[redacted]d  Fetus A Non-Stress Test Interpretation for 05/09/19  Indication: increased dopplers  Fetal Heart Rate A Mode: External Baseline Rate (A): 135 bpm Variability: Moderate Accelerations: 10 x 10 Decelerations: None Multiple birth?: No  Uterine Activity Mode: Palpation, Toco Contraction Frequency (min): None Resting Tone Palpated: Relaxed Resting Time: Adequate  Interpretation (Fetal Testing) Nonstress Test Interpretation: Reactive Comments: Reviewed tracing with Dr. Judeth Cornfield

## 2019-05-14 ENCOUNTER — Telehealth: Payer: Self-pay

## 2019-05-14 NOTE — Telephone Encounter (Signed)
Pt called stating baby is having hiccups. Pt made aware that hiccups are normal. Pt also states baby has been less active for two days. Advised pt go to [facility] to be seen about decreased fetal movements. Understanding was voiced.  Hetvi Shawhan l Amena Dockham, CMA

## 2019-05-18 ENCOUNTER — Other Ambulatory Visit: Payer: Self-pay

## 2019-05-18 ENCOUNTER — Ambulatory Visit (HOSPITAL_COMMUNITY): Payer: Medicaid Other | Attending: Obstetrics and Gynecology

## 2019-05-18 ENCOUNTER — Ambulatory Visit: Payer: Medicaid Other | Admitting: *Deleted

## 2019-05-18 ENCOUNTER — Ambulatory Visit (HOSPITAL_COMMUNITY): Payer: Medicaid Other

## 2019-05-18 DIAGNOSIS — O365930 Maternal care for other known or suspected poor fetal growth, third trimester, not applicable or unspecified: Secondary | ICD-10-CM | POA: Diagnosis not present

## 2019-05-18 DIAGNOSIS — E669 Obesity, unspecified: Secondary | ICD-10-CM

## 2019-05-18 DIAGNOSIS — O34219 Maternal care for unspecified type scar from previous cesarean delivery: Secondary | ICD-10-CM

## 2019-05-18 DIAGNOSIS — O0990 Supervision of high risk pregnancy, unspecified, unspecified trimester: Secondary | ICD-10-CM | POA: Insufficient documentation

## 2019-05-18 DIAGNOSIS — O283 Abnormal ultrasonic finding on antenatal screening of mother: Secondary | ICD-10-CM

## 2019-05-18 DIAGNOSIS — O358XX0 Maternal care for other (suspected) fetal abnormality and damage, not applicable or unspecified: Secondary | ICD-10-CM | POA: Diagnosis not present

## 2019-05-18 DIAGNOSIS — O359XX0 Maternal care for (suspected) fetal abnormality and damage, unspecified, not applicable or unspecified: Secondary | ICD-10-CM

## 2019-05-18 DIAGNOSIS — O99213 Obesity complicating pregnancy, third trimester: Secondary | ICD-10-CM

## 2019-05-18 DIAGNOSIS — Z3A29 29 weeks gestation of pregnancy: Secondary | ICD-10-CM

## 2019-05-18 NOTE — Procedures (Signed)
Brianna Palmer 09/29/1985 [redacted]w[redacted]d  Fetus A Non-Stress Test Interpretation for 05/18/19  Indication: IUGR  Fetal Heart Rate A Mode: External Baseline Rate (A): 130 bpm Variability: Moderate Accelerations: 10 x 10 Decelerations: None Multiple birth?: No  Uterine Activity Mode: Palpation, Toco Contraction Frequency (min): None Resting Tone Palpated: Relaxed Resting Time: Adequate  Interpretation (Fetal Testing) Nonstress Test Interpretation: Reactive Overall Impression: Reassuring for gestational age Comments: Tracing reviewed by Dr. Parke Poisson

## 2019-05-23 ENCOUNTER — Other Ambulatory Visit: Payer: Self-pay

## 2019-05-23 ENCOUNTER — Ambulatory Visit (INDEPENDENT_AMBULATORY_CARE_PROVIDER_SITE_OTHER): Payer: Medicaid Other | Admitting: Obstetrics & Gynecology

## 2019-05-23 VITALS — BP 119/78 | HR 67 | Wt 192.0 lb

## 2019-05-23 DIAGNOSIS — G43001 Migraine without aura, not intractable, with status migrainosus: Secondary | ICD-10-CM

## 2019-05-23 DIAGNOSIS — A6009 Herpesviral infection of other urogenital tract: Secondary | ICD-10-CM

## 2019-05-23 DIAGNOSIS — O34219 Maternal care for unspecified type scar from previous cesarean delivery: Secondary | ICD-10-CM

## 2019-05-23 DIAGNOSIS — O365990 Maternal care for other known or suspected poor fetal growth, unspecified trimester, not applicable or unspecified: Secondary | ICD-10-CM

## 2019-05-23 DIAGNOSIS — O0990 Supervision of high risk pregnancy, unspecified, unspecified trimester: Secondary | ICD-10-CM

## 2019-05-23 DIAGNOSIS — O358XX0 Maternal care for other (suspected) fetal abnormality and damage, not applicable or unspecified: Secondary | ICD-10-CM

## 2019-05-23 NOTE — Progress Notes (Signed)
   PRENATAL VISIT NOTE  Subjective:  Brianna Palmer is a 34 y.o. O2V0350 at [redacted]w[redacted]d being seen today for ongoing prenatal care.  She is currently monitored for the following issues for this high-risk pregnancy and has Depressive disorder, not elsewhere classified; Herpes genitalis in women; Migraine without aura and with status migrainosus, not intractable; Uterine scar from previous cesarean delivery affecting pregnancy; Supervision of high risk pregnancy, antepartum; Suspected damage to fetus from other disease in mother, affecting management of mother, antepartum condition or complication; and Pregnancy affected by fetal growth restriction on their problem list.  Patient reports no complaints.  Contractions: Irritability. Vag. Bleeding: None.  Movement: Present. Denies leaking of fluid.   The following portions of the patient's history were reviewed and updated as appropriate: allergies, current medications, past family history, past medical history, past social history, past surgical history and problem list.   Objective:   Vitals:   05/23/19 1530  BP: 119/78  Pulse: 67  Weight: 192 lb (87.1 kg)    Fetal Status: Fetal Heart Rate (bpm): 140   Movement: Present     General:  Alert, oriented and cooperative. Patient is in no acute distress.  Skin: Skin is warm and dry. No rash noted.   Cardiovascular: Normal heart rate noted  Respiratory: Normal respiratory effort, no problems with respiration noted  Abdomen: Soft, gravid, appropriate for gestational age.  Pain/Pressure: Absent     Pelvic: Cervical exam deferred        Extremities: Normal range of motion.  Edema: Trace  Mental Status: Normal mood and affect. Normal behavior. Normal judgment and thought content.   Assessment and Plan:  Pregnancy: [id number] at [redacted]w[redacted]d 1. Supervision of high risk pregnancy, antepartum FH is actually still WNL despite growth restriction FHR WNL Begin BPP at 32 weeks per MFM Weekly dopplers      2.  Pregnancy affected by fetal growth restriction 57/2021 Doppler studies of the umbilical arteries performed due to  fetal growth restriction showed an elevated S/D ratio from  4.72.  There were no signs of absent or reversed end-  diastolic flow noted today.  3. Suspected damage to fetus from disease in mother, antepartum condition, single or unspecified fetus  4. Uterine scar from previous cesarean delivery affecting pregnancy Prev c-section.  Declines TOLAC    5. Migraine without aura and with status migrainosus, not intractable  6. Herpes genitalis in women Begin suppression at 36 weeks   Preterm labor symptoms and general obstetric precautions including but not limited to vaginal bleeding, contractions, leaking of fluid and fetal movement were reviewed in detail with the patient. Please refer to After Visit Summary for other counseling recommendations.   Return in about 2 weeks (around 06/06/2019) for in person.  Future Appointments  Date Time Provider Department Center  05/25/2019 11:30 AM [facility] NURSE [facility]  05/25/2019 11:30 AM [facility] US3 [facility]    Willodean Rosenthal, MD

## 2019-05-25 ENCOUNTER — Ambulatory Visit: Payer: Medicaid Other | Admitting: *Deleted

## 2019-05-25 ENCOUNTER — Other Ambulatory Visit (HOSPITAL_COMMUNITY): Payer: Self-pay | Admitting: Obstetrics and Gynecology

## 2019-05-25 ENCOUNTER — Other Ambulatory Visit: Payer: Self-pay | Admitting: *Deleted

## 2019-05-25 ENCOUNTER — Ambulatory Visit (HOSPITAL_COMMUNITY): Payer: Medicaid Other | Attending: Obstetrics and Gynecology

## 2019-05-25 ENCOUNTER — Other Ambulatory Visit: Payer: Self-pay

## 2019-05-25 DIAGNOSIS — O34219 Maternal care for unspecified type scar from previous cesarean delivery: Secondary | ICD-10-CM | POA: Diagnosis present

## 2019-05-25 DIAGNOSIS — O365930 Maternal care for other known or suspected poor fetal growth, third trimester, not applicable or unspecified: Secondary | ICD-10-CM

## 2019-05-25 DIAGNOSIS — E669 Obesity, unspecified: Secondary | ICD-10-CM

## 2019-05-25 DIAGNOSIS — O358XX0 Maternal care for other (suspected) fetal abnormality and damage, not applicable or unspecified: Secondary | ICD-10-CM

## 2019-05-25 DIAGNOSIS — O99213 Obesity complicating pregnancy, third trimester: Secondary | ICD-10-CM

## 2019-05-25 DIAGNOSIS — O0990 Supervision of high risk pregnancy, unspecified, unspecified trimester: Secondary | ICD-10-CM | POA: Insufficient documentation

## 2019-05-25 DIAGNOSIS — Z3A30 30 weeks gestation of pregnancy: Secondary | ICD-10-CM

## 2019-05-25 DIAGNOSIS — O365990 Maternal care for other known or suspected poor fetal growth, unspecified trimester, not applicable or unspecified: Secondary | ICD-10-CM

## 2019-05-25 DIAGNOSIS — O283 Abnormal ultrasonic finding on antenatal screening of mother: Secondary | ICD-10-CM

## 2019-05-25 NOTE — Procedures (Signed)
Brianna Palmer 1985-07-18 [redacted]w[redacted]d  Fetus A Non-Stress Test Interpretation for 05/25/19  Indication: IUGR  Fetal Heart Rate A Mode: External Baseline Rate (A): 130 bpm Variability: Moderate Accelerations: 10 x 10 Decelerations: None Multiple birth?: No  Uterine Activity Mode: Palpation, Toco Contraction Frequency (min): None Resting Tone Palpated: Relaxed Resting Time: Adequate  Interpretation (Fetal Testing) Nonstress Test Interpretation: Reactive Overall Impression: Reassuring for gestational age Comments: Tracing reviewed by Dr. Judeth Cornfield

## 2019-05-28 ENCOUNTER — Inpatient Hospital Stay (HOSPITAL_COMMUNITY)
Admission: AD | Admit: 2019-05-28 | Discharge: 2019-05-28 | Disposition: A | Discharge status: Home / Self Care | Payer: Medicaid Other | Attending: Obstetrics and Gynecology | Admitting: Obstetrics and Gynecology

## 2019-05-28 ENCOUNTER — Encounter (HOSPITAL_COMMUNITY): Payer: Self-pay | Admitting: Obstetrics and Gynecology

## 2019-05-28 DIAGNOSIS — Z3A31 31 weeks gestation of pregnancy: Secondary | ICD-10-CM | POA: Insufficient documentation

## 2019-05-28 DIAGNOSIS — Z87891 Personal history of nicotine dependence: Secondary | ICD-10-CM | POA: Diagnosis not present

## 2019-05-28 DIAGNOSIS — O368130 Decreased fetal movements, third trimester, not applicable or unspecified: Secondary | ICD-10-CM | POA: Diagnosis not present

## 2019-05-28 DIAGNOSIS — O4703 False labor before 37 completed weeks of gestation, third trimester: Secondary | ICD-10-CM

## 2019-05-28 DIAGNOSIS — O34219 Maternal care for unspecified type scar from previous cesarean delivery: Secondary | ICD-10-CM | POA: Diagnosis not present

## 2019-05-28 DIAGNOSIS — Z3689 Encounter for other specified antenatal screening: Secondary | ICD-10-CM | POA: Diagnosis not present

## 2019-05-28 DIAGNOSIS — N939 Abnormal uterine and vaginal bleeding, unspecified: Secondary | ICD-10-CM | POA: Diagnosis present

## 2019-05-28 DIAGNOSIS — O0990 Supervision of high risk pregnancy, unspecified, unspecified trimester: Secondary | ICD-10-CM

## 2019-05-28 LAB — WET PREP, GENITAL
Clue Cells Wet Prep HPF POC: NONE SEEN
Sperm: NONE SEEN
Trich, Wet Prep: NONE SEEN
Yeast Wet Prep HPF POC: NONE SEEN

## 2019-05-28 NOTE — MAU Note (Signed)
Chief Complaint:  Decreased Fetal Movement, vaginal bleeding, and abdominal pain affecting pregnancy.   First Provider Initiated Contact with Patient 05/28/19 1552     HPI  HPI: Brianna Palmer is a 34 y.o. [id number] at 42w2dwho presents to maternity admissions reporting concern for decreased fetal movement, vaginal bleeding, and abdominal pain. Starting last night the patient is feeling decreased fetal movements, as baby did not move much during sleep and is not as reactive to patient placing hand on belly. Today morning patient saw a speck of blood in the toilet. Patient has also been experiencing diffuse abdominal pain that is cramp-like and episodic in nature, and these episodes have gradually occurred closer in proximity. The patient has not experienced these symptoms before during this pregnancy. The patient denies LOF, persistent vaginal bleeding, vaginal itching/burning, urinary symptoms, h/a, dizziness, n/v, diarrhea, constipation or fever/chills. She denies headache, visual changes or RUQ abdominal pain. Patient does not endorse flank pain, but states that she has had chronic back pain throughout the pregnancy. Last intercourse was on 05/26/19. Patient has not taken her Butalbital recently.   Past Medical History: Past Medical History:  Diagnosis Date  . Headache   . HSV infection     Past obstetric history: OB History  Gravida Para Term Preterm AB Living  6 2 2   3 2   SAB TAB Ectopic Multiple Live Births  2 1   0 2    # Outcome Date GA Lbr Len/2nd Weight Sex Delivery Anes PTL Lv  6 Current           5 Term 04/22/17 [redacted]w[redacted]d  3395 g M CS-LTranv Spinal  LIV  4 Term 03/30/12 [redacted]w[redacted]d  2830 g M CS-LVertical EPI  LIV  3 SAB 2012          2 SAB 2012 [redacted]w[redacted]d         1 TAB 2007            Past Surgical History: Past Surgical History:  Procedure Laterality Date  . BUNIONECTOMY    . CESAREAN SECTION N/A 03/30/2012   Procedure: CESAREAN SECTION;  Surgeon: 04/01/2012, MD;  Location: [facility];  Service: Obstetrics;  Laterality: N/A;  Primary Cesarean Section Delivery Baby Boy @ 0157, Apgars 8/9  . CESAREAN SECTION N/A 04/22/2017   Procedure: CESAREAN SECTION;  Surgeon: 06/22/2017, MD;  Location: [facility] BIRTHING SUITES;  Service: Obstetrics;  Laterality: N/A;    Family History: Family History  Adopted: Yes  Problem Relation Age of Onset  . Other Neg Hx   . Cancer Neg Hx   . Hypertension Neg Hx   . Stroke Neg Hx     Social History: Social History   Tobacco Use  . Smoking status: Former Smoker    Packs/day: 0.25    Types: Cigarettes    Quit date: 07/01/2013    Years since quitting: 5.9  . Smokeless tobacco: Never Used  Substance Use Topics  . Alcohol use: No    Alcohol/week: 0.0 standard drinks    Comment: socially  . Drug use: No    Allergies: No Known Allergies  Meds:  Medications Prior to Admission  Medication Sig Dispense Refill Last Dose  . Butalbital-APAP-Caffeine 50-325-40 MG capsule Take 1 capsule by mouth every 6 (six) hours as needed for headache. 30 capsule 3 Past Month at Unknown time  . Prenatal Vit-Fe Fumarate-FA (PRENATAL VITAMINS PO) Take by mouth.   05/28/2019 at Unknown time  . cyclobenzaprine (FLEXERIL) 10 MG tablet  Take 1 tablet (10 mg total) by mouth 3 (three) times daily as needed for muscle spasms. (Patient not taking: Reported on 03/02/2019) 30 tablet 2 More than a month at Unknown time    I have reviewed patient's Past Medical Hx, Surgical Hx, Family Hx, Social Hx, medications and allergies.   ROS:  Review of Systems Other systems negative  Physical Exam   Patient Vitals for the past 24 hrs:  BP Temp Temp src Pulse Resp SpO2  05/28/19 1524 131/79 97.8 F (36.6 C) Oral 79 16 100 %   Constitutional: Well-developed,well-nourished, in moderate distress due to abdominal pain Cardiovascular: normal rate and rhythm, no extra heart sounds, no murmurs, rubs, or gallops Respiratory: normal effort, clear to auscultation  bilaterally GI: Abd soft, non-tender, gravid appropriate for gestational age. No rebound or guarding. MS: No pitting edema, moves all extremities equally Neurologic: Alert and oriented x 4, cranial nerves grossly intact, no gross focal deficits  NOT ASSESSED BY ME: PELVIC EXAM: Cervix pink, visually closed, without lesion, significant white creamy discharge, vaginal walls and external genitalia normal Bimanual exam: Cervix firm, posterior, neg CMT, uterus nontender, Fundal Height consistent with dates, adnexa without tenderness, enlargement, or mass   FHT:  Baseline approx. 135 , good variability, accelerations present, no decelerations Contractions: rare, and irregular   Labs: Genital Wet Prep: Moderate leukocytosis, negative for Clue cells, Trichomonas, and Yeast.  GC/Chlamydia Probe: Results pending  Imaging:  N/A   MAU Course/MDM: I have reviewed results.  NST reviewed Consulted Laury Deep with presentation, exam findings and test results.  Treatments in MAU included...  Assessment: 1. Abdominal Pain: Wet Prep was negative for BV, Trich, and Yeast Infection with moderate leukocytosis. Upon reassessment, patient no longer endorses abdominal pain. Suggested patient return if abdominal pain returns as no further management is indicated.   2. Vaginal Bleeding: Given no blood was seen on pelvic exam, bleeding may be Renal or GI in origin. Suggested patient return if bleeding occurs again.  3. Decreased Fetal Movement: NST showed healthy fetus with variability and absence of decels, indicating adequate fetal movement. Patient is scheduled for Fetal Doppler tomorrow per routine. No further management here needed.  Plan: Discharge home Labor precautions and fetal kick counts Follow up in Office for prenatal visits and recheck   Pt stable at time of discharge.  Ok Edwards, MS3 05/28/2019 4:06 PM

## 2019-05-28 NOTE — Discharge Instructions (Signed)
Return to MAU:  If you have heavy bleeding that soaks through more that 2 pads per hour for an hour or more  If you bleed so much that you feel like you might pass out or you do pass out  If you have significant abdominal pain that is not improved with Tylenol 1000 mg every 6 hours as needed for pain  If you develop a fever > 100.5   

## 2019-05-28 NOTE — MAU Provider Note (Signed)
History     CSN: [id number]  Arrival date and time: 05/28/19 1508   First Provider Initiated Contact with Patient 05/28/19 1552      Chief Complaint  Patient presents with  . Decreased Fetal Movement  . Vaginal Bleeding   HPI Ms.  Brianna Palmer is a 34 y.o. year old [phone] female at [redacted]w[redacted]d weeks gestation who presents to MAU reporting DFM, abdominal pain and bleeding. She reports that the DFM started last night. She reports the baby "didn't move as much as usual at night. This AM she saw a "speck of blood in the toilet. She is having generalized abdominal pain; cramp-like. She feels they have gotten closer together. She denies persistent VB, LOF, vaginal irritation, N/V/D, H/A, visual changes, or RUQ pain. She denies flank pain, but does report sciatica pain that is chronic. Last SI was 05/26/2019. She receives high-risk OB care with [facility] and [facility]. She is scheduled to receive BMZ on 5/18 & 5/19. She reports that [facility] wants to have her delivered by 34 wks.  OB History    Gravida  6   Para  2   Term  2   Preterm      AB  3   Living  2     SAB  2   TAB  1   Ectopic      Multiple  0   Live Births  2           Past Medical History:  Diagnosis Date  . Headache   . HSV infection     Past Surgical History:  Procedure Laterality Date  . BUNIONECTOMY    . CESAREAN SECTION N/A 03/30/2012   Procedure: CESAREAN SECTION;  Surgeon: Antionette Char, MD;  Location: [facility];  Service: Obstetrics;  Laterality: N/A;  Primary Cesarean Section Delivery Baby Boy @ 0157, Apgars 8/9  . CESAREAN SECTION N/A 04/22/2017   Procedure: CESAREAN SECTION;  Surgeon: Lesly Dukes, MD;  Location: [facility] BIRTHING SUIT[facility];  Service: Obstetrics;  Laterality: N/A;    Family History  Adopted: Yes  Problem Relation Age of Onset  . Other Neg Hx   . Cancer Neg Hx   . Hypertension Neg Hx   . Stroke Neg Hx     Social History   Tobacco Use  . Smoking status: Former Smoker    Packs/day:  0.25    Types: Cigarettes    Quit date: 07/01/2013    Years since quitting: 5.9  . Smokeless tobacco: Never Used  Substance Use Topics  . Alcohol use: No    Alcohol/week: 0.0 standard drinks    Comment: socially  . Drug use: No    Allergies: No Known Allergies  Medications Prior to Admission  Medication Sig Dispense Refill Last Dose  . Butalbital-APAP-Caffeine 50-325-40 MG capsule Take 1 capsule by mouth every 6 (six) hours as needed for headache. 30 capsule 3 Past Month at Unknown time  . Prenatal Vit-Fe Fumarate-FA (PRENATAL VITAMINS PO) Take by mouth.   05/28/2019 at Unknown time  . cyclobenzaprine (FLEXERIL) 10 MG tablet Take 1 tablet (10 mg total) by mouth 3 (three) times daily as needed for muscle spasms. (Patient not taking: Reported on 03/02/2019) 30 tablet 2 More than a month at Unknown time    Review of Systems  Constitutional: Negative.   HENT: Negative.   Eyes: Negative.   Respiratory: Negative.   Cardiovascular: Negative.   Gastrointestinal: Negative.   Genitourinary: Positive for pelvic pain (occ cramping) and vaginal  bleeding. Negative for vaginal discharge.  Musculoskeletal: Negative.   Skin: Negative.   Allergic/Immunologic: Negative.   Neurological: Negative.   Hematological: Negative.   Psychiatric/Behavioral: Negative.    Physical Exam   Blood pressure 131/79, pulse 79, temperature 97.8 F (36.6 C), temperature source Oral, resp. rate 16, SpO2 100 %.  Physical Exam  Nursing note and vitals reviewed. Constitutional: She is oriented to person, place, and time. She appears well-developed and well-nourished.  HENT:  Head: Normocephalic and atraumatic.  Eyes: Pupils are equal, round, and reactive to light.  Cardiovascular: Normal rate.  Respiratory: Effort normal.  GI: Soft.  Genitourinary:    Genitourinary Comments: Uterus: gravid, SE: cervix is smooth, pink, no lesions, moderate amt of thick, creamy, white vaginal d/c -- WP, GC/CT done, no CMT or  friability, no adnexal tenderness -- NO evidence of bleeding anywhere in vaginal, vault or cervical   Musculoskeletal:        General: Normal range of motion.     Cervical back: Normal range of motion.  Neurological: She is alert and oriented to person, place, and time.  Skin: Skin is warm and dry.  Psychiatric: She has a normal mood and affect. Her behavior is normal. Judgment and thought content normal.    MAU Course  Procedures  MDM Wet Prep Reassessment at [facility] -- patient declined recheck of cervix  Results for orders placed or performed during the hospital encounter of 05/28/19 (from the past 24 hour(s))  Wet prep, genital     Status: Abnormal   Collection Time: 05/28/19  4:28 PM   Specimen: Vaginal  Result Value Ref Range   Yeast Wet Prep HPF POC NONE SEEN NONE SEEN   Trich, Wet Prep NONE SEEN NONE SEEN   Clue Cells Wet Prep HPF POC NONE SEEN NONE SEEN   WBC, Wet Prep HPF POC MODERATE (A) NONE SEEN   Sperm NONE SEEN     Assessment and Plan  NST (non-stress test) reactive  - Reassurance given that baby's FHR tracing is normal and shows no signs of distress - Discussed [facility]  Preterm uterine contractions in third trimester, antepartum - Return to MAU:  If you have heavy bleeding that soaks through more that 2 pads per hour for an hour or more  If you bleed so much that you feel like you might pass out or you do pass out  If you have significant abdominal pain that is not improved with Tylenol 1000 mg every 6 hours as needed for pain  If you develop a fever > 100.5   - Discharge patient - Keep scheduled appts with [facility] and [facility] - Patient verbalized an understanding of the plan of care and agrees.    Laury Deep, CNM 05/28/2019, 3:52 PM

## 2019-05-28 NOTE — MAU Note (Signed)
Brianna Palmer is a 34 y.o. at [redacted]w[redacted]d here in MAU reporting: DFM, states movements are not as strong. Denies pain or LOF. Having some mucus bleeding.   Onset of complaint: today  Pain score: 0/10  Vitals:   05/28/19 1524  BP: 131/79  Pulse: 79  Resp: 16  Temp: 97.8 F (36.6 C)  SpO2: 100%     FHT:140  Lab orders placed from triage: UA  Pt needing to use the bathroom, doppler FHT and pt assisted to bathroom. Will proceed with the rest of triage after she is finished.

## 2019-05-29 ENCOUNTER — Encounter: Payer: Self-pay | Admitting: *Deleted

## 2019-05-29 ENCOUNTER — Other Ambulatory Visit: Payer: Self-pay

## 2019-05-29 ENCOUNTER — Ambulatory Visit: Payer: Medicaid Other | Attending: Obstetrics and Gynecology | Admitting: *Deleted

## 2019-05-29 ENCOUNTER — Ambulatory Visit: Payer: Medicaid Other | Admitting: *Deleted

## 2019-05-29 ENCOUNTER — Ambulatory Visit: Payer: Medicaid Other

## 2019-05-29 DIAGNOSIS — O365930 Maternal care for other known or suspected poor fetal growth, third trimester, not applicable or unspecified: Secondary | ICD-10-CM | POA: Insufficient documentation

## 2019-05-29 DIAGNOSIS — Z3A31 31 weeks gestation of pregnancy: Secondary | ICD-10-CM | POA: Diagnosis not present

## 2019-05-29 DIAGNOSIS — O0990 Supervision of high risk pregnancy, unspecified, unspecified trimester: Secondary | ICD-10-CM

## 2019-05-29 DIAGNOSIS — O34219 Maternal care for unspecified type scar from previous cesarean delivery: Secondary | ICD-10-CM

## 2019-05-29 DIAGNOSIS — O365990 Maternal care for other known or suspected poor fetal growth, unspecified trimester, not applicable or unspecified: Secondary | ICD-10-CM

## 2019-05-29 LAB — GC/CHLAMYDIA PROBE AMP (~~LOC~~) NOT AT ARMC
Chlamydia: NEGATIVE
Comment: NEGATIVE
Comment: NORMAL
Neisseria Gonorrhea: NEGATIVE

## 2019-05-29 MED ORDER — BETAMETHASONE SOD PHOS & ACET 6 (3-3) MG/ML IJ SUSP
12.0000 mg | Freq: Once | INTRAMUSCULAR | Status: AC
  Administered 2019-05-29: 12 mg via INTRAMUSCULAR

## 2019-05-29 NOTE — Procedures (Signed)
Leyla Soliz 1985-02-07 [redacted]w[redacted]d  Fetus A Non-Stress Test Interpretation for 05/29/19  Indication: IUGR  Fetal Heart Rate A Mode: External Baseline Rate (A): 130 bpm Variability: Moderate Accelerations: 15 x 15 Decelerations: None Multiple birth?: No  Uterine Activity Mode: Palpation, Toco Contraction Frequency (min): None Resting Tone Palpated: Relaxed Resting Time: Adequate  Interpretation (Fetal Testing) Nonstress Test Interpretation: Reactive Comments: EFM tracing reviewed by Dr. Judeth Cornfield

## 2019-05-30 ENCOUNTER — Ambulatory Visit: Payer: Medicaid Other

## 2019-05-30 ENCOUNTER — Ambulatory Visit: Payer: Medicaid Other | Attending: Obstetrics and Gynecology | Admitting: *Deleted

## 2019-05-30 ENCOUNTER — Ambulatory Visit: Payer: Medicaid Other | Admitting: *Deleted

## 2019-05-30 DIAGNOSIS — O368130 Decreased fetal movements, third trimester, not applicable or unspecified: Secondary | ICD-10-CM | POA: Insufficient documentation

## 2019-05-30 DIAGNOSIS — O0990 Supervision of high risk pregnancy, unspecified, unspecified trimester: Secondary | ICD-10-CM

## 2019-05-30 DIAGNOSIS — Z3A31 31 weeks gestation of pregnancy: Secondary | ICD-10-CM | POA: Insufficient documentation

## 2019-05-30 DIAGNOSIS — O34219 Maternal care for unspecified type scar from previous cesarean delivery: Secondary | ICD-10-CM

## 2019-05-30 DIAGNOSIS — O368190 Decreased fetal movements, unspecified trimester, not applicable or unspecified: Secondary | ICD-10-CM

## 2019-05-30 MED ORDER — BETAMETHASONE SOD PHOS & ACET 6 (3-3) MG/ML IJ SUSP
12.0000 mg | Freq: Once | INTRAMUSCULAR | Status: AC
  Administered 2019-05-30: 12 mg via INTRAMUSCULAR

## 2019-05-30 NOTE — Progress Notes (Unsigned)
Pt here in [facility] for second BMZ injection today. Reports decreased fetal movement. States she has only felt her baby move x2 this morning. She also reports cramping and uterine irritability.  Denies vaginal bleeding or loss of fluid.  Pt informed to do kick counts per RN.  Dr. Judeth Cornfield notified of this decreased FM with pt now having 4 FM in last 15 minutes.  After speaking with Dr. Judeth Cornfield, Pt was placed on fetal monitor.

## 2019-05-30 NOTE — Procedures (Signed)
Dominic Rhome [birth year] [redacted]w[redacted]d  Fetus A Non-Stress Test Interpretation for 05/30/19  Indication: Decreased Fetal Movement  Fetal Heart Rate A Mode: External Baseline Rate (A): 120 bpm Variability: Moderate Accelerations: 15 x 15 Decelerations: None Multiple birth?: No  Uterine Activity Mode: Palpation, Toco Contraction Frequency (min): none Resting Tone Palpated: Relaxed Resting Time: Adequate  Interpretation (Fetal Testing) Nonstress Test Interpretation: Reactive Overall Impression: Reassuring for gestational age Comments: Reviewed tracing with Dr. Judeth Cornfield

## 2019-06-04 ENCOUNTER — Encounter: Payer: Self-pay | Admitting: *Deleted

## 2019-06-04 ENCOUNTER — Other Ambulatory Visit: Payer: Self-pay

## 2019-06-04 ENCOUNTER — Ambulatory Visit: Payer: Medicaid Other | Admitting: *Deleted

## 2019-06-04 ENCOUNTER — Ambulatory Visit: Payer: Medicaid Other | Attending: Obstetrics and Gynecology

## 2019-06-04 ENCOUNTER — Other Ambulatory Visit: Payer: Self-pay | Admitting: Obstetrics and Gynecology

## 2019-06-04 DIAGNOSIS — O358XX0 Maternal care for other (suspected) fetal abnormality and damage, not applicable or unspecified: Secondary | ICD-10-CM | POA: Diagnosis not present

## 2019-06-04 DIAGNOSIS — O0990 Supervision of high risk pregnancy, unspecified, unspecified trimester: Secondary | ICD-10-CM | POA: Insufficient documentation

## 2019-06-04 DIAGNOSIS — O359XX0 Maternal care for (suspected) fetal abnormality and damage, unspecified, not applicable or unspecified: Secondary | ICD-10-CM

## 2019-06-04 DIAGNOSIS — O99213 Obesity complicating pregnancy, third trimester: Secondary | ICD-10-CM

## 2019-06-04 DIAGNOSIS — O365990 Maternal care for other known or suspected poor fetal growth, unspecified trimester, not applicable or unspecified: Secondary | ICD-10-CM | POA: Diagnosis present

## 2019-06-04 DIAGNOSIS — O283 Abnormal ultrasonic finding on antenatal screening of mother: Secondary | ICD-10-CM

## 2019-06-04 DIAGNOSIS — O34219 Maternal care for unspecified type scar from previous cesarean delivery: Secondary | ICD-10-CM | POA: Diagnosis present

## 2019-06-04 DIAGNOSIS — O365930 Maternal care for other known or suspected poor fetal growth, third trimester, not applicable or unspecified: Secondary | ICD-10-CM | POA: Diagnosis present

## 2019-06-04 DIAGNOSIS — Z3A32 32 weeks gestation of pregnancy: Secondary | ICD-10-CM

## 2019-06-04 NOTE — Procedures (Signed)
Brianna Palmer 08/01/1985 [redacted]w[redacted]d  Fetus A Non-Stress Test Interpretation for 06/04/19  Indication: IUGR  Fetal Heart Rate A Mode: External Baseline Rate (A): 135 bpm Variability: Moderate Accelerations: 15 x 15 Decelerations: None Multiple birth?: No  Uterine Activity Mode: Toco Contraction Frequency (min): none noted Resting Tone Palpated: Relaxed Resting Time: Adequate  Interpretation (Fetal Testing) Nonstress Test Interpretation: Reactive Comments: FHR tracing rev'd by Dr. Parke Poisson

## 2019-06-07 ENCOUNTER — Encounter: Payer: Self-pay | Admitting: *Deleted

## 2019-06-07 ENCOUNTER — Other Ambulatory Visit: Payer: Self-pay

## 2019-06-07 ENCOUNTER — Ambulatory Visit: Payer: Medicaid Other | Attending: Obstetrics and Gynecology

## 2019-06-07 ENCOUNTER — Ambulatory Visit: Payer: Medicaid Other | Admitting: *Deleted

## 2019-06-07 DIAGNOSIS — O358XX0 Maternal care for other (suspected) fetal abnormality and damage, not applicable or unspecified: Secondary | ICD-10-CM | POA: Diagnosis not present

## 2019-06-07 DIAGNOSIS — O0990 Supervision of high risk pregnancy, unspecified, unspecified trimester: Secondary | ICD-10-CM

## 2019-06-07 DIAGNOSIS — O34219 Maternal care for unspecified type scar from previous cesarean delivery: Secondary | ICD-10-CM

## 2019-06-07 DIAGNOSIS — O99213 Obesity complicating pregnancy, third trimester: Secondary | ICD-10-CM

## 2019-06-07 DIAGNOSIS — O283 Abnormal ultrasonic finding on antenatal screening of mother: Secondary | ICD-10-CM

## 2019-06-07 DIAGNOSIS — O359XX0 Maternal care for (suspected) fetal abnormality and damage, unspecified, not applicable or unspecified: Secondary | ICD-10-CM | POA: Diagnosis not present

## 2019-06-07 DIAGNOSIS — Z3A32 32 weeks gestation of pregnancy: Secondary | ICD-10-CM

## 2019-06-07 DIAGNOSIS — O365930 Maternal care for other known or suspected poor fetal growth, third trimester, not applicable or unspecified: Secondary | ICD-10-CM | POA: Diagnosis not present

## 2019-06-07 DIAGNOSIS — O365990 Maternal care for other known or suspected poor fetal growth, unspecified trimester, not applicable or unspecified: Secondary | ICD-10-CM | POA: Diagnosis present

## 2019-06-07 DIAGNOSIS — E669 Obesity, unspecified: Secondary | ICD-10-CM

## 2019-06-08 ENCOUNTER — Ambulatory Visit (INDEPENDENT_AMBULATORY_CARE_PROVIDER_SITE_OTHER): Payer: Medicaid Other | Admitting: Obstetrics & Gynecology

## 2019-06-08 ENCOUNTER — Other Ambulatory Visit: Payer: Self-pay | Admitting: *Deleted

## 2019-06-08 VITALS — BP 119/79 | HR 96 | Wt 191.0 lb

## 2019-06-08 DIAGNOSIS — O0993 Supervision of high risk pregnancy, unspecified, third trimester: Secondary | ICD-10-CM

## 2019-06-08 DIAGNOSIS — O365990 Maternal care for other known or suspected poor fetal growth, unspecified trimester, not applicable or unspecified: Secondary | ICD-10-CM

## 2019-06-08 DIAGNOSIS — O0990 Supervision of high risk pregnancy, unspecified, unspecified trimester: Secondary | ICD-10-CM

## 2019-06-08 DIAGNOSIS — Z23 Encounter for immunization: Secondary | ICD-10-CM

## 2019-06-08 DIAGNOSIS — Z98891 History of uterine scar from previous surgery: Secondary | ICD-10-CM

## 2019-06-08 DIAGNOSIS — Z8619 Personal history of other infectious and parasitic diseases: Secondary | ICD-10-CM

## 2019-06-08 DIAGNOSIS — O365931 Maternal care for other known or suspected poor fetal growth, third trimester, fetus 1: Secondary | ICD-10-CM

## 2019-06-08 DIAGNOSIS — Z3A32 32 weeks gestation of pregnancy: Secondary | ICD-10-CM

## 2019-06-08 MED ORDER — VALACYCLOVIR HCL 500 MG PO TABS: 500.0000 mg | ORAL_TABLET | Freq: Two times a day (BID) | ORAL | 6 refills | Status: DC

## 2019-06-08 NOTE — Patient Instructions (Addendum)
Return to office for any scheduled appointments. Call the office or go to the MAU at [facility] at [facility] if:  You begin to have strong, frequent contractions  Your water breaks.  Sometimes it is a big gush of fluid, sometimes it is just a trickle that keeps getting your panties wet or running down your legs  You have vaginal bleeding.  It is normal to have a small amount of spotting if your cervix was checked.   You do not feel your baby moving like normal.  If you do not, get something to eat and drink and lay down and focus on feeling your baby move.   If your baby is still not moving like normal, you should call the office or go to MAU.  Any other obstetric concerns.   Postpartum Tubal Ligation Postpartum tubal ligation (PPTL) is a procedure to close the fallopian tubes. This is done so that you cannot get pregnant. When the fallopian tubes are closed, the eggs that the ovaries release cannot enter the uterus, and sperm cannot reach the eggs. PPTL is done right after childbirth or 1-2 days after childbirth, before the uterus returns to its normal location. If you have a cesarean section, it can be performed at the same time as the procedure. Having this done after childbirth does not make your stay in the hospital longer. PPTL is sometimes called "getting your tubes tied." You should not have this procedure if you want to get pregnant again or if you are unsure about having more children. Tell a health care provider about:  Any allergies you have.  All medicines you are taking, including vitamins, herbs, eye drops, creams, and over-the-counter medicines.  Any problems you or family members have had with anesthetic medicines.  Any blood disorders you have.  Any surgeries you have had.  Any medical conditions you have or have had.  Any past pregnancies. What are the risks? Generally, this is a safe procedure. However, problems may occur,  including:  Infection.  Bleeding.  Injury to other organs in the abdomen.  Side effects from anesthetic medicines.  Failure of the procedure. If this happens, you could get pregnant.  Having a fertilized egg attach outside the uterus (ectopic pregnancy). What happens before the procedure?  Ask your health care provider about: ? How much pain you can expect to have. ? What medicines you will be given for pain, especially if you are planning to breastfeed. What happens during the procedure? If you had a vaginal delivery:  You will be given one or more of the following: ? A medicine to help you relax (sedative). ? A medicine to numb the area (local anesthetic). ? A medicine to make you fall asleep (general anesthetic). ? A medicine that is injected into an area of your body to numb everything below the injection site (regional anesthetic).  If you have been given a general anesthetic, a tube will be put down your throat to help you breathe.  An IV will be inserted into one of your veins.  Your bladder may be emptied with a small tube (catheter).  An incision will be made just below your belly button.  Your fallopian tubes will be located and brought up through the incision.  Your fallopian tubes will be tied off, burned (cauterized), or blocked with a clip, ring, or clamp. A small part in the center of each fallopian tube may be removed.  The incision will be closed with  stitches (sutures).  A bandage (dressing) will be placed over the incision. If you had a cesarean delivery:  Tubal ligation will be done through the incision that was used for the cesarean delivery of your baby.  The incision will be closed with sutures.  A dressing will be placed over the incision. The procedure may vary among health care providers and hospitals. What happens after the procedure?  Your blood pressure, heart rate, breathing rate, and blood oxygen level will be monitored until you  leave the hospital.  You will be given pain medicine as needed.  Do not drive for 24 hours if you were given a sedative during your procedure. Summary  Postpartum tubal ligation is a procedure that closes the fallopian tubes so you cannot get pregnant anymore.  This procedure is done while you are still in the hospital after childbirth. If you have a cesarean section, it can be performed at the same time.  Having this done after childbirth does not make your stay in the hospital longer.  Postpartum tubal ligation is considered permanent. You should not have this procedure if you want to get pregnant again or if you are unsure about having more children.  Talk to your health care provider to see if this procedure is right for you. This information is not intended to replace advice given to you by your health care provider. Make sure you discuss any questions you have with your health care provider. Document Revised: 06/12/2018 Document Reviewed: 11/17/2017 Elsevier Patient Education  2020 [employer].

## 2019-06-08 NOTE — Progress Notes (Signed)
PRENATAL VISIT NOTE  Subjective:  Brianna Palmer is a 34 y.o. [id number] at [redacted]w[redacted]d being seen today for ongoing prenatal care.  She is currently monitored for the following issues for this high-risk pregnancy and has Depressive disorder, not elsewhere classified; Herpes genitalis in women; Migraine without aura and with status migrainosus, not intractable; Status post repeat low transverse cesarean section; Supervision of high risk pregnancy, antepartum; Suspected damage to fetus from other disease in mother, affecting management of mother, antepartum condition or complication; and Pregnancy affected by fetal growth restriction on their problem list.  Patient reports no complaints. Generally, worried about baby given FGR and elevated dopplers.   Contractions: Irregular. Vag. Bleeding: None.  Movement: Present. Denies leaking of fluid.   The following portions of the patient's history were reviewed and updated as appropriate: allergies, current medications, past family history, past medical history, past social history, past surgical history and problem list.   Objective:   Vitals:   06/08/19 1003  BP: 119/79  Pulse: 96  Weight: 191 lb (86.6 kg)    Fetal Status: Fetal Heart Rate (bpm): 151   Movement: Present     General:  Alert, oriented and cooperative. Patient is in no acute distress.  Skin: Skin is warm and dry. No rash noted.   Cardiovascular: Normal heart rate noted  Respiratory: Normal respiratory effort, no problems with respiration noted  Abdomen: Soft, gravid, appropriate for gestational age.  Pain/Pressure: Absent     Pelvic: Cervical exam deferred        Extremities: Normal range of motion.  Edema: None  Mental Status: Normal mood and affect. Normal behavior. Normal judgment and thought content.   Imaging: [place] MFM FETAL BPP WO NON STRESS  Result Date: 06/07/2019 ----------------------------------------------------------------------  OBSTETRICS REPORT                        (Signed Final 06/07/2019 04:20 pm) ---------------------------------------------------------------------- Patient Info  ID #:       [id number]                          D.O.B.:  Mar 19, 1985 (33 yrs)  Name:       Brianna Palmer                   Visit Date: 06/07/2019 12:34 pm ---------------------------------------------------------------------- Performed By  Attending:        Noralee Space MD        Ref. Address:     [address]                                                             Rd                                                             [employer]  [id number]  Performed By:     Tommie RaymondMesha Tester BS,       [facility]                    RDMS, RVT                                Fetal Care  Referred By:      Levie HeritageJACOB J STINSON                    MD ---------------------------------------------------------------------- Orders  #  Description                           Code        Ordered By  1  US MFM FETAL BPP WO NON               76819.01    RAVI SHANKAR     STRESS  2  US MFM UA CORD DOPPLER                [id number]    [facility] ----------------------------------------------------------------------  #  Order #                     Accession #                Episode #  1  [id number]                   [id number]                 [id number]  2  [id number]                   [id number]                 [id number] ---------------------------------------------------------------------- Indications  [redacted] weeks gestation of pregnancy                Z3A.32  Maternal care for known or suspected poor      O36.5930  fetal growth, third trimester, not applicable or  unspecified IUGR  Echogenic bowel                                O35.8XX0 028.3  History of cesarean delivery, currently        O34.219  pregnant  Fetal abnormality - other known or             O35.9XX0  suspected (cystic area in abdomen/pelvis)  Obesity complicating pregnancy, third           O99.213  trimester ---------------------------------------------------------------------- Vital Signs                                                 Height:        5'3" ---------------------------------------------------------------------- Fetal Evaluation  Num Of Fetuses:         1  Fetal Heart Rate(bpm):  138  Cardiac Activity:       Observed  Presentation:           Cephalic  Placenta:  Posterior  P. Cord Insertion:      Previously Visualized  Amniotic Fluid  AFI FV:      Within normal limits  AFI Sum(cm)     %Tile       Largest Pocket(cm)  10.5            21          3.4  RUQ(cm)       RLQ(cm)       LUQ(cm)        LLQ(cm)  3.4           1             3.4            2.7 ---------------------------------------------------------------------- Biophysical Evaluation  Amniotic F.V:   Pocket => 2 cm             F. Tone:        Observed  F. Movement:    Observed                   Score:          8/8  F. Breathing:   Observed ---------------------------------------------------------------------- OB History  Gravidity:    6         Term:   2         SAB:   2  TOP:          1        Living:  2 ---------------------------------------------------------------------- Gestational Age  Best:          32w 5d     Det. By:  Loman Chroman         EDD:   07/28/19                                      (12/04/18) ---------------------------------------------------------------------- Doppler - Fetal Vessels  Umbilical Artery   S/D     %tile      RI               PI                     ADFV    RDFV   5.13   > 97.5    0.81             1.32                        No      No ---------------------------------------------------------------------- Cervix Uterus Adnexa  Cervix  Not visualized (advanced GA >24wks)  Uterus  No abnormality visualized.  Right Ovary  No adnexal mass visualized.  Left Ovary  No adnexal mass visualized.  Cul De Sac  No free fluid seen.  Adnexa  No abnormality visualized.  ---------------------------------------------------------------------- Impression  Patient with severe fetal growth restriction return for antenatal  testing.  She had completed a course of betamethasone.  At  her last visit NST was performed that was reassuring.  Patient  reports good fetal movements.  On today's ultrasound, amniotic fluid is normal and good fetal  activity seen.  Antenatal testing is reassuring.  BPP 8/8.  Umbilical artery Doppler showed increased S/D ratio (no  absent or reversed flow.  I have reassured the patient of the findings (no progression of  Doppler).  I informed her that timing of delivery will  be based  on follow-up fetal growth assessment and antenatal testing.  If absent end-diastolic flow is seen, we would recommend  delivery at [redacted] weeks gestation.  Her blood pressure today at our office is 126/86 mmHg. ---------------------------------------------------------------------- Recommendations  Twice-weekly BPP and UA Doppler to continue.  -Weekly NST. ----------------------------------------------------------------------                  Noralee Space, MD Electronically Signed Final Report   06/07/2019 04:20 pm ----------------------------------------------------------------------  [place] MFM UA CORD DOPPLER  Result Date: 06/07/2019 ----------------------------------------------------------------------  OBSTETRICS REPORT                       (Signed Final 06/07/2019 04:20 pm) ---------------------------------------------------------------------- Patient Info  ID #:       [id number]                          D.O.B.:  [birth year] (33 yrs)  Name:       Brianna Palmer                   Visit Date: 06/07/2019 12:34 pm ---------------------------------------------------------------------- Performed By  Attending:        Noralee Space MD        Ref. Address:     [address]                                                              [place]                                                             [id number]  Performed By:     Tommie Raymond BS,       Location[facility]                    RDMS, RVT                                Fetal Care  Referred By:      Levie Heritage                    MD ---------------------------------------------------------------------- Orders  #  Description                           Code        Ordered By  1  [place] MFM FETAL BPP WO NON               76819.01    [facility]  STRESS  2  [place] MFM UA CORD DOPPLER                76820.02    [facility] ----------------------------------------------------------------------  #  Order #                     Accession #                Episode #  1  [id number]                   [id number]                 [id number]  2  [id number]                   [id number]                 [id number] ---------------------------------------------------------------------- Indications  [redacted] weeks gestation of pregnancy                Z3A.32  Maternal care for known or suspected poor      O36.5930  fetal growth, third trimester, not applicable or  unspecified IUGR  Echogenic bowel                                O35.8XX0 028.3  History of cesarean delivery, currently        O34.219  pregnant  Fetal abnormality - other known or             O35.9XX0  suspected (cystic area in abdomen/pelvis)  Obesity complicating pregnancy, third          O99.213  trimester ---------------------------------------------------------------------- Vital Signs                                                 Height:        5'3" ---------------------------------------------------------------------- Fetal Evaluation  Num Of Fetuses:         1  Fetal Heart Rate(bpm):  138  Cardiac Activity:       Observed  Presentation:           Cephalic  Placenta:               Posterior  P. Cord Insertion:      Previously Visualized  Amniotic Fluid  AFI FV:      Within normal limits  AFI Sum(cm)     %Tile       Largest  Pocket(cm)  10.5            21          3.4  RUQ(cm)       RLQ(cm)       LUQ(cm)        LLQ(cm)  3.4           1             3.4            2.7 ---------------------------------------------------------------------- Biophysical Evaluation  Amniotic F.V:   Pocket => 2 cm             F. Tone:        Observed  F. Movement:    Observed  Score:          8/8  F. Breathing:   Observed ---------------------------------------------------------------------- OB History  Gravidity:    6         Term:   2         SAB:   2  TOP:          1        Living:  2 ---------------------------------------------------------------------- Gestational Age  Best:          32w 5d     Det. By:  Marcella Dubs         EDD:   07/28/19                                      (12/04/18) ---------------------------------------------------------------------- Doppler - Fetal Vessels  Umbilical Artery   S/D     %tile      RI               PI                     ADFV    RDFV   5.13   > 97.5    0.81             1.32                        No      No ---------------------------------------------------------------------- Cervix Uterus Adnexa  Cervix  Not visualized (advanced GA >24wks)  Uterus  No abnormality visualized.  Right Ovary  No adnexal mass visualized.  Left Ovary  No adnexal mass visualized.  Cul De Sac  No free fluid seen.  Adnexa  No abnormality visualized. ---------------------------------------------------------------------- Impression  Patient with severe fetal growth restriction return for antenatal  testing.  She had completed a course of betamethasone.  At  her last visit NST was performed that was reassuring.  Patient  reports good fetal movements.  On today's ultrasound, amniotic fluid is normal and good fetal  activity seen.  Antenatal testing is reassuring.  BPP 8/8.  Umbilical artery Doppler showed increased S/D ratio (no  absent or reversed flow.  I have reassured the patient of the findings (no progression of   Doppler).  I informed her that timing of delivery will be based  on follow-up fetal growth assessment and antenatal testing.  If absent end-diastolic flow is seen, we would recommend  delivery at [redacted] weeks gestation.  Her blood pressure today at our office is 126/86 mmHg. ---------------------------------------------------------------------- Recommendations  Twice-weekly BPP and UA Doppler to continue.  -Weekly NST. ----------------------------------------------------------------------                  Noralee Space, MD Electronically Signed Final Report   06/07/2019 04:20 pm ----------------------------------------------------------------------   Assessment and Plan:  Pregnancy: [id number] at [redacted]w[redacted]d 1. Pregnancy affected by fetal growth restriction Increased dopplers. Continue twice-weekly BPP and UA Doppler and weekly NST. Possible delivery in next few weeks.  2. Status post repeat low transverse cesarean section Desires RCS, timing to be determined by her twice weekly testing/MFM. No scheduling done for now. Husband already got and cleared after vasectomy, patient considering BTS at time of RCS, but does not wants tubes removed, just wants clips placed. Medicaid papers signed today.   3. Need for Tdap vaccination Tdap given.  - Tdap vaccine greater than or equal to  7yo IM  4. History of herpes simplex infection Denies ever having an outbreak, just positive antibodies but does report oral lesions after pregnancy. Recommended HSV suppression given potential severity of neonatal HSV encephalopathy, also avoiding kissing babies on mouth, refraining from giving oral sex if she has symptoms/blisters in mouth etc. Will start now as she may deliver in the next few weeks. - valACYclovir (VALTREX) 500 MG tablet; Take 1 tablet (500 mg total) by mouth 2 (two) times daily. Can also take two tablets by mouth at same time if tolerated  Dispense: 60 tablet; Refill: 6  5. [redacted] weeks gestation of pregnancy 6.  Supervision of high risk pregnancy, antepartum Preterm labor symptoms and general obstetric precautions including but not limited to vaginal bleeding, contractions, leaking of fluid and fetal movement were reviewed in detail with the patient. Please refer to After Visit Summary for other counseling recommendations.   Return in about 2 weeks (around 06/22/2019) for OFFICE OB Visit.  Future Appointments  Date Time Provider Department Center  06/12/2019 11:15 AM [facility] NURSE [facility]  06/12/2019 11:15 AM [facility] US2 [facility]  06/12/2019  1:15 PM [facility] NST [facility]  06/15/2019  2:00 PM [facility] US1 [facility]  06/19/2019  2:15 PM [facility] US2 [facility]  06/19/2019  3:15 PM [facility] NST [facility]  06/22/2019  7:45 AM [facility] US4 [facility]  06/25/2019 10:15 AM Willodean Rosenthal, MD [facility] None  06/26/2019  2:00 PM [facility] US1 [facility]  06/26/2019  3:15 PM [facility] NST [facility]    Jaynie Collins, MD

## 2019-06-12 ENCOUNTER — Inpatient Hospital Stay (HOSPITAL_COMMUNITY): Payer: Medicaid Other | Admitting: Anesthesiology

## 2019-06-12 ENCOUNTER — Encounter (HOSPITAL_COMMUNITY)
Admission: AD | Disposition: A | Discharge status: Home / Self Care | Payer: Self-pay | Source: Home / Self Care | Attending: Family Medicine

## 2019-06-12 ENCOUNTER — Ambulatory Visit: Payer: Medicaid Other | Admitting: *Deleted

## 2019-06-12 ENCOUNTER — Other Ambulatory Visit: Payer: Self-pay

## 2019-06-12 ENCOUNTER — Ambulatory Visit (HOSPITAL_BASED_OUTPATIENT_CLINIC_OR_DEPARTMENT_OTHER): Payer: Medicaid Other

## 2019-06-12 ENCOUNTER — Ambulatory Visit: Payer: Medicaid Other

## 2019-06-12 ENCOUNTER — Inpatient Hospital Stay (HOSPITAL_COMMUNITY)
Admission: AD | Admit: 2019-06-12 | Discharge: 2019-06-15 | DRG: 784 | Disposition: A | Discharge status: Home / Self Care | Payer: Medicaid Other | Attending: Family Medicine | Admitting: Family Medicine

## 2019-06-12 ENCOUNTER — Encounter (HOSPITAL_COMMUNITY): Payer: Self-pay | Admitting: Obstetrics & Gynecology

## 2019-06-12 ENCOUNTER — Encounter: Payer: Self-pay | Admitting: *Deleted

## 2019-06-12 DIAGNOSIS — O9832 Other infections with a predominantly sexual mode of transmission complicating childbirth: Secondary | ICD-10-CM | POA: Diagnosis present

## 2019-06-12 DIAGNOSIS — O34211 Maternal care for low transverse scar from previous cesarean delivery: Secondary | ICD-10-CM | POA: Diagnosis present

## 2019-06-12 DIAGNOSIS — O283 Abnormal ultrasonic finding on antenatal screening of mother: Secondary | ICD-10-CM | POA: Diagnosis not present

## 2019-06-12 DIAGNOSIS — O1424 HELLP syndrome, complicating childbirth: Principal | ICD-10-CM | POA: Diagnosis present

## 2019-06-12 DIAGNOSIS — Z3A33 33 weeks gestation of pregnancy: Secondary | ICD-10-CM

## 2019-06-12 DIAGNOSIS — Z87891 Personal history of nicotine dependence: Secondary | ICD-10-CM | POA: Diagnosis not present

## 2019-06-12 DIAGNOSIS — O0990 Supervision of high risk pregnancy, unspecified, unspecified trimester: Secondary | ICD-10-CM

## 2019-06-12 DIAGNOSIS — O34219 Maternal care for unspecified type scar from previous cesarean delivery: Secondary | ICD-10-CM

## 2019-06-12 DIAGNOSIS — O365990 Maternal care for other known or suspected poor fetal growth, unspecified trimester, not applicable or unspecified: Secondary | ICD-10-CM | POA: Insufficient documentation

## 2019-06-12 DIAGNOSIS — O1423 HELLP syndrome (HELLP), third trimester: Secondary | ICD-10-CM

## 2019-06-12 DIAGNOSIS — O365930 Maternal care for other known or suspected poor fetal growth, third trimester, not applicable or unspecified: Secondary | ICD-10-CM | POA: Diagnosis present

## 2019-06-12 DIAGNOSIS — O99213 Obesity complicating pregnancy, third trimester: Secondary | ICD-10-CM

## 2019-06-12 DIAGNOSIS — A6000 Herpesviral infection of urogenital system, unspecified: Secondary | ICD-10-CM | POA: Diagnosis present

## 2019-06-12 DIAGNOSIS — O358XX0 Maternal care for other (suspected) fetal abnormality and damage, not applicable or unspecified: Secondary | ICD-10-CM | POA: Diagnosis present

## 2019-06-12 DIAGNOSIS — Z20822 Contact with and (suspected) exposure to covid-19: Secondary | ICD-10-CM | POA: Diagnosis present

## 2019-06-12 DIAGNOSIS — E669 Obesity, unspecified: Secondary | ICD-10-CM

## 2019-06-12 DIAGNOSIS — O1420 HELLP syndrome (HELLP), unspecified trimester: Secondary | ICD-10-CM | POA: Diagnosis present

## 2019-06-12 DIAGNOSIS — Z302 Encounter for sterilization: Secondary | ICD-10-CM | POA: Diagnosis not present

## 2019-06-12 DIAGNOSIS — Z3009 Encounter for other general counseling and advice on contraception: Secondary | ICD-10-CM | POA: Diagnosis present

## 2019-06-12 DIAGNOSIS — R519 Headache, unspecified: Secondary | ICD-10-CM | POA: Diagnosis present

## 2019-06-12 DIAGNOSIS — Z98891 History of uterine scar from previous surgery: Secondary | ICD-10-CM

## 2019-06-12 LAB — TYPE AND SCREEN
ABO/RH(D): B POS
Antibody Screen: NEGATIVE

## 2019-06-12 LAB — URINALYSIS, ROUTINE W REFLEX MICROSCOPIC
Bilirubin Urine: NEGATIVE
Glucose, UA: NEGATIVE mg/dL
Hgb urine dipstick: NEGATIVE
Ketones, ur: NEGATIVE mg/dL
Leukocytes,Ua: NEGATIVE
Nitrite: NEGATIVE
Protein, ur: NEGATIVE mg/dL
Specific Gravity, Urine: 1.004 — ABNORMAL LOW (ref 1.005–1.030)
pH: 7 (ref 5.0–8.0)

## 2019-06-12 LAB — COMPREHENSIVE METABOLIC PANEL
ALT: 1045 U/L — ABNORMAL HIGH (ref 0–44)
AST: 900 U/L — ABNORMAL HIGH (ref 15–41)
Albumin: 2.7 g/dL — ABNORMAL LOW (ref 3.5–5.0)
Alkaline Phosphatase: 160 U/L — ABNORMAL HIGH (ref 38–126)
Anion gap: 8 (ref 5–15)
BUN: 9 mg/dL (ref 6–20)
CO2: 24 mmol/L (ref 22–32)
Calcium: 9.3 mg/dL (ref 8.9–10.3)
Chloride: 106 mmol/L (ref 98–111)
Creatinine, Ser: 0.94 mg/dL (ref 0.44–1.00)
GFR calc Af Amer: >60 mL/min (ref >60)
GFR calc non Af Amer: >60 mL/min (ref >60)
Glucose, Bld: 82 mg/dL (ref 70–99)
Potassium: 4.2 mmol/L (ref 3.5–5.1)
Sodium: 138 mmol/L (ref 135–145)
Total Bilirubin: 0.6 mg/dL (ref 0.3–1.2)
Total Protein: 7 g/dL (ref 6.5–8.1)

## 2019-06-12 LAB — CBC
HCT: 43.3 % (ref 36.0–46.0)
HCT: 42.9 % (ref 36.0–46.0)
Hemoglobin: 14.5 g/dL (ref 12.0–15.0)
Hemoglobin: 14.5 g/dL (ref 12.0–15.0)
MCH: 29.2 pg (ref 26.0–34.0)
MCH: 29.5 pg (ref 26.0–34.0)
MCHC: 33.5 g/dL (ref 30.0–36.0)
MCHC: 33.8 g/dL (ref 30.0–36.0)
MCV: 87.3 fL (ref 80.0–100.0)
MCV: 87.2 fL (ref 80.0–100.0)
Platelets: 212 10*3/uL (ref 150–400)
Platelets: 224 10*3/uL (ref 150–400)
RBC: 4.96 MIL/uL (ref 3.87–5.11)
RBC: 4.92 MIL/uL (ref 3.87–5.11)
RDW: 14.2 % (ref 11.5–15.5)
RDW: 14.3 % (ref 11.5–15.5)
WBC: 11.5 10*3/uL — ABNORMAL HIGH (ref 4.0–10.5)
WBC: 14.3 10*3/uL — ABNORMAL HIGH (ref 4.0–10.5)
nRBC: 1.4 % — ABNORMAL HIGH (ref 0.0–0.2)
nRBC: 1 % — ABNORMAL HIGH (ref 0.0–0.2)

## 2019-06-12 LAB — SARS CORONAVIRUS 2 BY RT PCR (HOSPITAL ORDER, PERFORMED IN ~~LOC~~ HOSPITAL LAB): SARS Coronavirus 2: NEGATIVE

## 2019-06-12 LAB — PROTEIN / CREATININE RATIO, URINE
Creatinine, Urine: 23.82 mg/dL
Protein Creatinine Ratio: 0.63 mg/mg{Cre} — ABNORMAL HIGH (ref 0.00–0.15)
Total Protein, Urine: 15 mg/dL

## 2019-06-12 LAB — ABO/RH: ABO/RH(D): B POS

## 2019-06-12 LAB — CREATININE, SERUM
Creatinine, Ser: 1.04 mg/dL — ABNORMAL HIGH (ref 0.44–1.00)
GFR calc Af Amer: >60 mL/min (ref >60)
GFR calc non Af Amer: >60 mL/min (ref >60)

## 2019-06-12 SURGERY — Surgical Case
Anesthesia: Spinal | Wound class: Clean Contaminated

## 2019-06-12 MED ORDER — FENTANYL CITRATE (PF) 100 MCG/2ML IJ SOLN
INTRAMUSCULAR | Status: DC | PRN
  Administered 2019-06-12: 15 ug via INTRATHECAL

## 2019-06-12 MED ORDER — NALOXONE HCL 4 MG/10ML IJ SOLN
1.0000 ug/kg/h | INTRAVENOUS | Status: DC | PRN
  Filled 2019-06-12: qty 5

## 2019-06-12 MED ORDER — ONDANSETRON HCL 4 MG/2ML IJ SOLN
INTRAMUSCULAR | Status: AC
  Filled 2019-06-12: qty 2

## 2019-06-12 MED ORDER — DIPHENHYDRAMINE HCL 50 MG/ML IJ SOLN
12.5000 mg | INTRAMUSCULAR | Status: DC | PRN
  Administered 2019-06-12: 12.5 mg via INTRAVENOUS

## 2019-06-12 MED ORDER — FENTANYL CITRATE (PF) 100 MCG/2ML IJ SOLN
INTRAMUSCULAR | Status: AC
  Filled 2019-06-12: qty 2

## 2019-06-12 MED ORDER — NALOXONE HCL 0.4 MG/ML IJ SOLN: 0.4000 mg | INTRAMUSCULAR | Status: DC | PRN

## 2019-06-12 MED ORDER — SIMETHICONE 80 MG PO CHEW: 80.0000 mg | CHEWABLE_TABLET | ORAL | Status: DC | PRN

## 2019-06-12 MED ORDER — KETOROLAC TROMETHAMINE 30 MG/ML IJ SOLN: 30.0000 mg | Freq: Four times a day (QID) | INTRAMUSCULAR | Status: DC | PRN

## 2019-06-12 MED ORDER — PHENYLEPHRINE HCL-NACL 20-0.9 MG/250ML-% IV SOLN
INTRAVENOUS | Status: AC
  Filled 2019-06-12: qty 250

## 2019-06-12 MED ORDER — ONDANSETRON HCL 4 MG/2ML IJ SOLN: 4.0000 mg | Freq: Three times a day (TID) | INTRAMUSCULAR | Status: DC | PRN

## 2019-06-12 MED ORDER — SCOPOLAMINE 1 MG/3DAYS TD PT72: 1.0000 | MEDICATED_PATCH | Freq: Once | TRANSDERMAL | Status: DC

## 2019-06-12 MED ORDER — PRENATAL MULTIVITAMIN CH
1.0000 | ORAL_TABLET | Freq: Every day | ORAL | Status: DC
  Administered 2019-06-13 – 2019-06-14 (×2): 1 via ORAL
  Filled 2019-06-12 (×2): qty 1

## 2019-06-12 MED ORDER — MORPHINE SULFATE (PF) 0.5 MG/ML IJ SOLN
INTRAMUSCULAR | Status: AC
  Filled 2019-06-12: qty 10

## 2019-06-12 MED ORDER — BUTALBITAL-APAP-CAFFEINE 50-325-40 MG PO TABS
2.0000 | ORAL_TABLET | Freq: Once | ORAL | Status: AC
  Administered 2019-06-12: 2 via ORAL
  Filled 2019-06-12: qty 2

## 2019-06-12 MED ORDER — KETOROLAC TROMETHAMINE 30 MG/ML IJ SOLN: 30.0000 mg | Freq: Once | INTRAMUSCULAR | Status: DC

## 2019-06-12 MED ORDER — BETAMETHASONE SOD PHOS & ACET 6 (3-3) MG/ML IJ SUSP
12.0000 mg | Freq: Once | INTRAMUSCULAR | Status: AC
  Administered 2019-06-12: 12 mg via INTRAMUSCULAR
  Filled 2019-06-12: qty 5

## 2019-06-12 MED ORDER — LACTATED RINGERS IV SOLN: INTRAVENOUS | Status: DC

## 2019-06-12 MED ORDER — ACETAMINOPHEN 500 MG PO TABS: 1000.0000 mg | ORAL_TABLET | Freq: Four times a day (QID) | ORAL | Status: DC

## 2019-06-12 MED ORDER — DIPHENHYDRAMINE HCL 50 MG/ML IJ SOLN: 12.5000 mg | INTRAMUSCULAR | Status: DC | PRN

## 2019-06-12 MED ORDER — ONDANSETRON HCL 4 MG/2ML IJ SOLN
INTRAMUSCULAR | Status: DC | PRN
  Administered 2019-06-12: 4 mg via INTRAVENOUS

## 2019-06-12 MED ORDER — MORPHINE SULFATE (PF) 0.5 MG/ML IJ SOLN
INTRAMUSCULAR | Status: DC | PRN
  Administered 2019-06-12: .15 mg via INTRATHECAL

## 2019-06-12 MED ORDER — SIMETHICONE 80 MG PO CHEW
80.0000 mg | CHEWABLE_TABLET | Freq: Three times a day (TID) | ORAL | Status: DC
  Administered 2019-06-13 – 2019-06-15 (×8): 80 mg via ORAL
  Filled 2019-06-12 (×7): qty 1

## 2019-06-12 MED ORDER — SCOPOLAMINE 1 MG/3DAYS TD PT72
1.0000 | MEDICATED_PATCH | Freq: Once | TRANSDERMAL | Status: AC
  Administered 2019-06-12: 1.5 mg via TRANSDERMAL

## 2019-06-12 MED ORDER — MAGNESIUM SULFATE 40 GM/1000ML IV SOLN
2.0000 g/h | INTRAVENOUS | Status: DC
  Filled 2019-06-12: qty 1000

## 2019-06-12 MED ORDER — KETOROLAC TROMETHAMINE 30 MG/ML IJ SOLN: 30.0000 mg | Freq: Once | INTRAMUSCULAR | Status: DC | PRN

## 2019-06-12 MED ORDER — DIPHENHYDRAMINE HCL 25 MG PO CAPS: 25.0000 mg | ORAL_CAPSULE | Freq: Four times a day (QID) | ORAL | Status: DC | PRN

## 2019-06-12 MED ORDER — OXYTOCIN-SODIUM CHLORIDE 30-0.9 UT/500ML-% IV SOLN
INTRAVENOUS | Status: DC | PRN
  Administered 2019-06-12: 100 mL via INTRAVENOUS

## 2019-06-12 MED ORDER — LACTATED RINGERS IV BOLUS: 1000.0000 mL | Freq: Once | INTRAVENOUS | Status: DC

## 2019-06-12 MED ORDER — SODIUM CHLORIDE 0.9 % IR SOLN
Status: DC | PRN
  Administered 2019-06-12: 1

## 2019-06-12 MED ORDER — DEXTROSE 5 % IV SOLN
1.0000 ug/kg/h | INTRAVENOUS | Status: DC | PRN
Start: 2019-06-12 — End: 2019-06-12

## 2019-06-12 MED ORDER — HYDROMORPHONE HCL 1 MG/ML IJ SOLN: 0.2000 mg | INTRAMUSCULAR | Status: DC | PRN

## 2019-06-12 MED ORDER — MENTHOL 3 MG MT LOZG: 1.0000 | LOZENGE | OROMUCOSAL | Status: DC | PRN

## 2019-06-12 MED ORDER — TETANUS-DIPHTH-ACELL PERTUSSIS 5-2.5-18.5 LF-MCG/0.5 IM SUSP: 0.5000 mL | Freq: Once | INTRAMUSCULAR | Status: DC

## 2019-06-12 MED ORDER — ACETAMINOPHEN 10 MG/ML IV SOLN
INTRAVENOUS | Status: AC
  Filled 2019-06-12: qty 100

## 2019-06-12 MED ORDER — FAMOTIDINE IN NACL 20-0.9 MG/50ML-% IV SOLN
20.0000 mg | Freq: Once | INTRAVENOUS | Status: AC
  Administered 2019-06-12: 20 mg via INTRAVENOUS
  Filled 2019-06-12: qty 50

## 2019-06-12 MED ORDER — COCONUT OIL OIL
1.0000 "application " | TOPICAL_OIL | Status: DC | PRN
  Administered 2019-06-15: 1 via TOPICAL

## 2019-06-12 MED ORDER — MEPERIDINE HCL 25 MG/ML IJ SOLN: 6.2500 mg | INTRAMUSCULAR | Status: DC | PRN

## 2019-06-12 MED ORDER — FENTANYL CITRATE (PF) 100 MCG/2ML IJ SOLN: 25.0000 ug | INTRAMUSCULAR | Status: DC | PRN

## 2019-06-12 MED ORDER — NALBUPHINE HCL 10 MG/ML IJ SOLN: 5.0000 mg | INTRAMUSCULAR | Status: DC | PRN

## 2019-06-12 MED ORDER — SCOPOLAMINE 1 MG/3DAYS TD PT72
MEDICATED_PATCH | TRANSDERMAL | Status: AC
  Filled 2019-06-12: qty 1

## 2019-06-12 MED ORDER — ENOXAPARIN SODIUM 40 MG/0.4ML ~~LOC~~ SOLN
40.0000 mg | SUBCUTANEOUS | Status: DC
  Administered 2019-06-13 – 2019-06-15 (×3): 40 mg via SUBCUTANEOUS
  Filled 2019-06-12 (×3): qty 0.4

## 2019-06-12 MED ORDER — WITCH HAZEL-GLYCERIN EX PADS: 1.0000 "application " | MEDICATED_PAD | CUTANEOUS | Status: DC | PRN

## 2019-06-12 MED ORDER — BUPIVACAINE IN DEXTROSE 0.75-8.25 % IT SOLN
INTRATHECAL | Status: DC | PRN
  Administered 2019-06-12: 1.8 mL via INTRATHECAL

## 2019-06-12 MED ORDER — NALBUPHINE HCL 10 MG/ML IJ SOLN
5.0000 mg | INTRAMUSCULAR | Status: DC | PRN
Start: 2019-06-12 — End: 2019-06-12

## 2019-06-12 MED ORDER — IBUPROFEN 800 MG PO TABS
800.0000 mg | ORAL_TABLET | Freq: Four times a day (QID) | ORAL | Status: DC
  Administered 2019-06-14 – 2019-06-15 (×7): 800 mg via ORAL
  Filled 2019-06-12 (×7): qty 1

## 2019-06-12 MED ORDER — NALBUPHINE HCL 10 MG/ML IJ SOLN: 5.0000 mg | Freq: Once | INTRAMUSCULAR | Status: DC | PRN

## 2019-06-12 MED ORDER — ACETAMINOPHEN 10 MG/ML IV SOLN
1000.0000 mg | Freq: Once | INTRAVENOUS | Status: DC | PRN
  Administered 2019-06-12: 1000 mg via INTRAVENOUS

## 2019-06-12 MED ORDER — OXYCODONE HCL 5 MG PO TABS
5.0000 mg | ORAL_TABLET | ORAL | Status: DC | PRN
  Administered 2019-06-14: 10 mg via ORAL
  Administered 2019-06-15 (×2): 5 mg via ORAL
  Filled 2019-06-12 (×2): qty 1
  Filled 2019-06-12: qty 2

## 2019-06-12 MED ORDER — PROMETHAZINE HCL 25 MG/ML IJ SOLN: 6.2500 mg | INTRAMUSCULAR | Status: DC | PRN

## 2019-06-12 MED ORDER — DIPHENHYDRAMINE HCL 50 MG/ML IJ SOLN
INTRAMUSCULAR | Status: AC
  Filled 2019-06-12: qty 1

## 2019-06-12 MED ORDER — OXYTOCIN-SODIUM CHLORIDE 30-0.9 UT/500ML-% IV SOLN
INTRAVENOUS | Status: AC
  Filled 2019-06-12: qty 500

## 2019-06-12 MED ORDER — SOD CITRATE-CITRIC ACID 500-334 MG/5ML PO SOLN
30.0000 mL | Freq: Once | ORAL | Status: AC
  Administered 2019-06-12: 30 mL via ORAL
  Filled 2019-06-12: qty 30

## 2019-06-12 MED ORDER — ACETAMINOPHEN 500 MG PO TABS
1000.0000 mg | ORAL_TABLET | Freq: Four times a day (QID) | ORAL | Status: DC
  Administered 2019-06-13 – 2019-06-15 (×10): 1000 mg via ORAL
  Filled 2019-06-12 (×10): qty 2

## 2019-06-12 MED ORDER — OXYTOCIN-SODIUM CHLORIDE 30-0.9 UT/500ML-% IV SOLN
2.5000 [IU]/h | INTRAVENOUS | Status: AC
  Administered 2019-06-13: 2.5 [IU]/h via INTRAVENOUS
  Filled 2019-06-12: qty 500

## 2019-06-12 MED ORDER — NALBUPHINE HCL 10 MG/ML IJ SOLN
5.0000 mg | Freq: Once | INTRAMUSCULAR | Status: DC | PRN
Start: 2019-06-12 — End: 2019-06-12

## 2019-06-12 MED ORDER — SODIUM CHLORIDE 0.9% FLUSH: 3.0000 mL | INTRAVENOUS | Status: DC | PRN

## 2019-06-12 MED ORDER — KETOROLAC TROMETHAMINE 30 MG/ML IJ SOLN
30.0000 mg | Freq: Four times a day (QID) | INTRAMUSCULAR | Status: AC
  Administered 2019-06-13 (×4): 30 mg via INTRAVENOUS
  Filled 2019-06-12 (×4): qty 1

## 2019-06-12 MED ORDER — DIBUCAINE (PERIANAL) 1 % EX OINT: 1.0000 "application " | TOPICAL_OINTMENT | CUTANEOUS | Status: DC | PRN

## 2019-06-12 MED ORDER — SENNOSIDES-DOCUSATE SODIUM 8.6-50 MG PO TABS
2.0000 | ORAL_TABLET | ORAL | Status: DC
  Administered 2019-06-13 – 2019-06-14 (×3): 2 via ORAL
  Filled 2019-06-12 (×3): qty 2

## 2019-06-12 MED ORDER — DIPHENHYDRAMINE HCL 25 MG PO CAPS: 25.0000 mg | ORAL_CAPSULE | ORAL | Status: DC | PRN

## 2019-06-12 MED ORDER — MAGNESIUM SULFATE BOLUS VIA INFUSION
6.0000 g | Freq: Once | INTRAVENOUS | Status: AC
  Administered 2019-06-12: 6 g via INTRAVENOUS
  Filled 2019-06-12: qty 1000

## 2019-06-12 MED ORDER — LACTATED RINGERS IV BOLUS
500.0000 mL | Freq: Once | INTRAVENOUS | Status: AC
  Administered 2019-06-12: 500 mL via INTRAVENOUS

## 2019-06-12 MED ORDER — ZOLPIDEM TARTRATE 5 MG PO TABS: 5.0000 mg | ORAL_TABLET | Freq: Every evening | ORAL | Status: DC | PRN

## 2019-06-12 MED ORDER — SIMETHICONE 80 MG PO CHEW
80.0000 mg | CHEWABLE_TABLET | ORAL | Status: DC
  Administered 2019-06-13 – 2019-06-14 (×3): 80 mg via ORAL
  Filled 2019-06-12 (×3): qty 1

## 2019-06-12 MED ORDER — CEFAZOLIN SODIUM-DEXTROSE 2-4 GM/100ML-% IV SOLN
2.0000 g | INTRAVENOUS | Status: AC
  Administered 2019-06-12: 2 g via INTRAVENOUS
  Filled 2019-06-12: qty 100

## 2019-06-12 MED ORDER — PHENYLEPHRINE HCL-NACL 20-0.9 MG/250ML-% IV SOLN
INTRAVENOUS | Status: DC | PRN
  Administered 2019-06-12: 70 ug/min via INTRAVENOUS

## 2019-06-12 SURGICAL SUPPLY — 36 items
BENZOIN TINCTURE PRP APPL 2/3 (GAUZE/BANDAGES/DRESSINGS) ×2 IMPLANT
CHLORAPREP W/TINT 26ML (MISCELLANEOUS) ×2 IMPLANT
CLAMP CORD UMBIL (MISCELLANEOUS) IMPLANT
CLIP FILSHIE TUBAL LIGA STRL (Clip) ×2 IMPLANT
CLOTH BEACON ORANGE TIMEOUT ST (SAFETY) ×2 IMPLANT
DRSG OPSITE POSTOP 4X10 (GAUZE/BANDAGES/DRESSINGS) ×2 IMPLANT
ELECT REM PT RETURN 9FT ADLT (ELECTROSURGICAL) ×2
ELECTRODE REM PT RTRN 9FT ADLT (ELECTROSURGICAL) ×1 IMPLANT
EXTRACTOR VACUUM M CUP 4 TUBE (SUCTIONS) IMPLANT
GLOVE BIOGEL PI IND STRL 7.0 (GLOVE) ×2 IMPLANT
GLOVE BIOGEL PI IND STRL 7.5 (GLOVE) ×2 IMPLANT
GLOVE BIOGEL PI INDICATOR 7.0 (GLOVE) ×2
GLOVE BIOGEL PI INDICATOR 7.5 (GLOVE) ×2
GLOVE ECLIPSE 7.5 STRL STRAW (GLOVE) ×2 IMPLANT
GOWN STRL REUS W/TWL LRG LVL3 (GOWN DISPOSABLE) ×6 IMPLANT
KIT ABG SYR 3ML LUER SLIP (SYRINGE) IMPLANT
NDL HYPO 25X5/8 SAFETYGLIDE (NEEDLE) IMPLANT
NEEDLE HYPO 25X5/8 SAFETYGLIDE (NEEDLE) IMPLANT
NS IRRIG 1000ML POUR BTL (IV SOLUTION) ×2 IMPLANT
PACK C SECTION WH (CUSTOM PROCEDURE TRAY) ×2 IMPLANT
PAD ABD 7.5X8 STRL (GAUZE/BANDAGES/DRESSINGS) ×1 IMPLANT
PAD OB MATERNITY 4.3X12.25 (PERSONAL CARE ITEMS) ×2 IMPLANT
PENCIL SMOKE EVAC W/HOLSTER (ELECTROSURGICAL) ×2 IMPLANT
RTRCTR C-SECT PINK 25CM LRG (MISCELLANEOUS) ×2 IMPLANT
SPONGE GAUZE 4X4 12PLY STER LF (GAUZE/BANDAGES/DRESSINGS) ×2 IMPLANT
STRIP CLOSURE SKIN 1/2X4 (GAUZE/BANDAGES/DRESSINGS) ×2 IMPLANT
SUT CHROMIC 0 CT 1 (SUTURE) ×1 IMPLANT
SUT MON AB-0 CT1 36 (SUTURE) ×2 IMPLANT
SUT VIC AB 0 CTX 36 (SUTURE) ×3
SUT VIC AB 0 CTX36XBRD ANBCTRL (SUTURE) ×3 IMPLANT
SUT VIC AB 2-0 CT1 27 (SUTURE) ×1
SUT VIC AB 2-0 CT1 TAPERPNT 27 (SUTURE) ×1 IMPLANT
SUT VIC AB 4-0 KS 27 (SUTURE) ×2 IMPLANT
TOWEL OR 17X24 6PK STRL BLUE (TOWEL DISPOSABLE) ×2 IMPLANT
TRAY FOLEY W/BAG SLVR 14FR LF (SET/KITS/TRAYS/PACK) ×2 IMPLANT
WATER STERILE IRR 1000ML POUR (IV SOLUTION) ×2 IMPLANT

## 2019-06-12 NOTE — Anesthesia Preprocedure Evaluation (Addendum)
Anesthesia Evaluation  Patient identified by MRN, date of birth, ID band Patient awake    Reviewed: Allergy & Precautions, NPO status , Patient's Chart, lab work & pertinent test results  Airway Mallampati: II  TM Distance: >3 FB Neck ROM: Full    Dental no notable dental hx. (+) Teeth Intact, Dental Advisory Given   Pulmonary neg pulmonary ROS, former smoker,    Pulmonary exam normal breath sounds clear to auscultation       Cardiovascular negative cardio ROS Normal cardiovascular exam Rhythm:Regular Rate:Normal     Neuro/Psych  Headaches, PSYCHIATRIC DISORDERS Depression    GI/Hepatic negative GI ROS, Neg liver ROS,   Endo/Other  negative endocrine ROS  Renal/GU negative Renal ROS     Musculoskeletal negative musculoskeletal ROS (+)   Abdominal   Peds  Hematology negative hematology ROS (+)   Anesthesia Other Findings Repeat C/Sx3  Reproductive/Obstetrics (+) Pregnancy HELLP on Mag                            Anesthesia Physical Anesthesia Plan  ASA: III  Anesthesia Plan: Spinal   Post-op Pain Management:    Induction:   PONV Risk Score and Plan: Treatment may vary due to age or medical condition  Airway Management Planned: Natural Airway  Additional Equipment:   Intra-op Plan:   Post-operative Plan:   Informed Consent: I have reviewed the patients History and Physical, chart, labs and discussed the procedure including the risks, benefits and alternatives for the proposed anesthesia with the patient or authorized representative who has indicated his/her understanding and acceptance.     Dental advisory given  Plan Discussed with: Anesthesiologist  Anesthesia Plan Comments:        Anesthesia Quick Evaluation

## 2019-06-12 NOTE — Op Note (Signed)
Operative Note   SURGERY DATE: 06/12/2019  PRE-OP DIAGNOSIS:  *Pregnancy at 33 weeks *HELLP syndrome *H/o Cesarean delivery x2 *IUGR <1% *Unwanted fertility  POST-OP DIAGNOSIS:  *Pregnancy at 33 weeks *HELLP syndrome *H/o Cesarean delivery x2 *IUGR <1% *Uterine window *Omental adhesions  *Unwanted fertility  PROCEDURE: repeat low transverse cesarean section via pfannenstiel skin incision with double layer uterine closure, lysis of adhesions, bilateral tubal ligation with Filshie clips  SURGEON: Surgeon(s) and Role:    * Truett Mainland, DO - Primary    * Adisa Litt L, DO - Assisting  ASSISTANT: None  ANESTHESIA: spinal  QBL: 96 mL, as estimated by the Triton. However as we feel this is a gross underestimation, I would estimate out actual blood loss to be around 350 mL  DRAINS: 800 mL UOP via indwelling foley  TOTAL IV FLUIDS: 300 mL crystalloid  VTE PROPHYLAXIS: SCDs to bilateral lower extremities  ANTIBIOTICS: Two grams of Cefazolin were given, within 1 hour of skin incision  SPECIMENS: placenta to pathology  COMPLICATIONS: None immediate  INDICATIONS: Repeat elective Cesarean delivery, HELLP syndrome  FINDINGS: Omental adhesions were noted. Grossly normal uterus with a 3 cm uterine window, tubes and ovaries. Clear amniotic fluid, cephalic female infant, weight 1300 gm, APGARs 6/8, intact placenta.  PROCEDURE IN DETAIL: The patient was taken to the operating room where anesthesia was administered and normal fetal heart tones were confirmed. She was then prepped and draped in the normal fashion in the dorsal supine position with a leftward tilt.  After a time out was performed, a pfannensteil skin incision was made with the scalpel and carried through to the underlying layer of fascia. The fascia was then incised at the midline and this incision was extended laterally with the mayo scissors. Attention was turned to the superior aspect of the fascial incision which  was grasped with the kocher clamps x 2, tented up and the rectus muscles were dissected off with sharply.  The rectus muscles were then separated in the midline and the peritoneum was entered sharply. The Alexis retractor was inserted and the vesicouterine peritoneum was identified, tented up and entered with the metzenbaum scissors. This incision was extended laterally and a small bladder flap was created digitally. At this point a 3 cm uterine window was identified on the left side of the lower uterine segment.  A low transverse hysterotomy was made lightly with the scalpel until the endometrial cavity was breached and the amniotic sac ruptured, yielding clear amniotic fluid. This incision was extended bluntly and the infant's head, shoulders and body were delivered atraumatically.The cord was clamped x 2 and cut, and the infant was handed to the awaiting pediatricians, after delayed cord clamping was done.  The placenta was then gradually expressed from the uterus and then the uterus was cleared of all clots and debris. The hysterotomy was repaired with a running suture of 0 Monocryl. A second imbricating layer of 0 Monocryl suture was then placed, achieving excellent hemostasis.   Filshie Clips:  The left Fallopian tube was identified by tracing out to the fimbraie, grasped with the Babcock clamps. An avascular midsection of the tube approximately 3-4cm from the cornua was grasped with the babcock clamps and the filshie clip was applied, taking care to incorporate the entire tube.  Attention was then turned to the right fallopian tube after confirmation by tracing the tube out to the fimbriae. The same procedure was then performed on the right Fallopian tube, with excellent hemostasis was noted  from both BTL sites.   The hysterotomy and all operative sites were reinspected and excellent hemostasis was noted.  The rectus muscles were reapproximated with two interrupted sutures of 0 Chromic gut. The fascia  was reapproximated with 0 Vicryl in a simple running fashion bilaterally. The subcutaneous layer was then reapproximated with one interrupted suture of 0 Chromic gut, and the skin was then closed with 4-0 Vicryl, in a subcuticular fashion.  The patient  tolerated the procedure well. Sponge, lap, needle, and instrument counts were correct x 2. The patient was transferred to the recovery room awake, alert and breathing independently in stable condition.  Marlowe Alt, DO [facility] [employer] [facility])

## 2019-06-12 NOTE — Anesthesia Postprocedure Evaluation (Signed)
Anesthesia Post Note  Patient: Ayeshia Coppin  Procedure(s) Performed: CESAREAN SECTION WITH BILATERAL TUBAL LIGATION (N/A )     Patient location during evaluation: PACU Anesthesia Type: Spinal Level of consciousness: oriented and awake and alert Pain management: pain level controlled Vital Signs Assessment: post-procedure vital signs reviewed and stable Respiratory status: spontaneous breathing, respiratory function stable and patient connected to nasal cannula oxygen Cardiovascular status: blood pressure returned to baseline and stable Postop Assessment: no headache, no backache and no apparent nausea or vomiting Anesthetic complications: no    Last Vitals:  Vitals:   06/12/19 2201 06/12/19 2300  BP: 127/76 125/79  Pulse: 82 85  Resp: 20 18  Temp: 36.5 C 36.7 C  SpO2: 98% 99%    Last Pain:  Vitals:   06/12/19 2300  TempSrc: Oral  PainSc: 0-No pain   Pain Goal: Patients Stated Pain Goal: 3 (06/12/19 2300)              Epidural/Spinal Function Cutaneous sensation: Normal sensation (06/12/19 2300), Patient able to flex knees: Yes (06/12/19 2300), Patient able to lift hips off bed: Yes (06/12/19 2300), Back pain beyond tenderness at insertion site: No (06/12/19 2300), Progressively worsening motor and/or sensory loss: No (06/12/19 2300), Bowel and/or bladder incontinence post epidural: No (06/12/19 2300)  Panfilo Ketchum L Yalonda Sample

## 2019-06-12 NOTE — Discharge Summary (Addendum)
Postpartum Discharge Summary  Date of Service updated 06/15/2019     Patient Name: Brianna Palmer DOB: 07/27/1985 MRN: [id number]  Date of admission: 06/12/2019 Delivery date:06/12/2019  Delivering provider: Truett Mainland  Date of discharge: 06/15/2019 Admitting diagnosis: Cesarean delivery delivered [O82] Intrauterine pregnancy: [redacted]w[redacted]d    Secondary diagnosis:  Principal Problem:   HELLP syndrome Active Problems:   Herpes genitalis in women   Status post repeat low transverse cesarean section   Suspected damage to fetus from other disease in mother, affecting management of mother, antepartum condition or complication   Pregnancy affected by fetal growth restriction   Cesarean delivery delivered   Unwanted fertility  Additional problems: elevated LFT     Discharge diagnosis: Term Pregnancy Delivered                                              Post partum procedures:none Augmentation: N/A Complications: None  Hospital course: Sceduled C/S   34y.o. yo [id number] admitted to the hospital 06/12/2019 for HELLP syndrom and history of Cesarean delivery x2. She went to the OR for a scheduled cesarean section with the following indication:Elective Repeat, HELLP syndrome. Delivery details are as follows:  Membrane Rupture Time/Date: 6:49 PM ,06/12/2019   Delivery Method:C-Section, Low Transverse  Details of operation can be found in separate operative note.  Patient had an uncomplicated postpartum course.Her elevated LFT improved but will be followed  She is ambulating, tolerating a regular diet, passing flatus, and urinating well. Patient is discharged home in stable condition on  06/15/2019        Newborn Data: Birth date:06/12/2019  Birth time:6:50 PM  Gender:Female  Living status:Living  Apgars:6 ,8  Weight:1300 g     Magnesium Sulfate received: Yes: Seizure prophylaxis BMZ received: Yes Rhophylac:N/A MMR:No T-DaP:Given prenatally Flu: No Transfusion:No  Physical exam   Vitals:   06/14/19 1610 06/14/19 2033 06/14/19 2325 06/15/19 0959  BP: 95/62 125/75 122/68 118/78  Pulse: 63 60 86 81  Resp: _0 Temp: 98 F (36.7 C) 98.2 F (36.8 C) (!) 97.1 F (36.2 C) 97.6 F (36.4 C)  TempSrc: Oral Oral Oral Oral  SpO2: 100% 100% 97% 100%  Weight:      Height:       General: alert, cooperative and no distress Lochia: appropriate Uterine Fundus: firm Incision: Dressing is clean, dry, and intact DVT Evaluation: No evidence of DVT seen on physical exam. Labs: Lab Results  Component Value Date   WBC 19.6 (H) 06/13/2019   HGB 15.7 (H) 06/13/2019   HCT 47.6 (H) 06/13/2019   MCV 87.7 06/13/2019   PLT 250 06/13/2019   CMP Latest Ref Rng & Units 06/14/2019  Glucose 70 - 99 mg/dL 88  BUN 6 - 20 mg/dL 16  Creatinine 0.44 - 1.00 mg/dL 1.01(H)  Sodium 135 - 145 mmol/L 136  Potassium 3.5 - 5.1 mmol/L 4.4  Chloride 98 - 111 mmol/L 105  CO2 22 - 32 mmol/L 22  Calcium 8.9 - 10.3 mg/dL 6.9(L)  Total Protein 6.5 - 8.1 g/dL 6.4(L)  Total Bilirubin 0.3 - 1.2 mg/dL 0.4  Alkaline Phos 38 - 126 U/L 113  AST 15 - 41 U/L 254(H)  ALT 0 - 44 U/L 589(H)   EdFlavia Shippercore: Edinburgh Postnatal Depression Scale Screening Tool 06/14/2019  I have been  able to laugh and see the funny side of things. 0  I have looked forward with enjoyment to things. 0  I have blamed myself unnecessarily when things went wrong. 0  I have been anxious or worried for no good reason. 0  I have felt scared or panicky for no good reason. 0  Things have been getting on top of me. 0  I have been so unhappy that I have had difficulty sleeping. 0  I have felt sad or miserable. 0  I have been so unhappy that I have been crying. 0  The thought of harming myself has occurred to me. 0  Edinburgh Postnatal Depression Scale Total 0     After visit meds:  Allergies as of 06/15/2019   No Known Allergies     Medication List    STOP taking these medications   valACYclovir 500 MG  tablet Commonly known as: VALTREX     TAKE these medications   hydrochlorothiazide 25 MG tablet Commonly known as: HYDRODIURIL Take 1 tablet (25 mg total) by mouth daily.   ibuprofen 800 MG tablet Commonly known as: ADVIL Take 1 tablet (800 mg total) by mouth every 6 (six) hours.   oxyCODONE 5 MG immediate release tablet Commonly known as: Oxy IR/ROXICODONE Take 1-2 tablets (5-10 mg total) by mouth every 4 (four) hours as needed for moderate pain.   PRENATAL VITAMINS PO Take by mouth.        Discharge home in stable condition Infant Feeding: Breast Infant Disposition:NICU Discharge instruction: per After Visit Summary and Postpartum booklet. Activity: Advance as tolerated. Pelvic rest for 6 weeks.  Diet: routine diet Future Appointments: Future Appointments  Date Time Provider Department Center  06/22/2019 10:45 AM Stinson, Jacob J, DO [facility] None   Follow up Visit: Follow-up Information    [facility] [place] Follow up in 1 week(s).   Specialty: Obstetrics and Gynecology Why: BP, CMP, incision check Contact information: [address] [place] [address] [phone]           Please schedule this patient for a In person postpartum visit in 4 weeks with the following provider: MD. Additional Postpartum F/U:Incision check 1 week  High risk pregnancy complicated by: HELLP, HSV genitalis, and h/o Cesarean delivery Delivery mode:  C-Section, Low Transverse  Anticipated Birth Control:  Filshie clips done at time of Cesarean   06/15/2019 James Arnold, MD   

## 2019-06-12 NOTE — Anesthesia Procedure Notes (Signed)
Spinal  Patient location during procedure: OR Start time: 06/12/2019 6:21 PM End time: 06/12/2019 6:31 PM Staffing Performed: anesthesiologist  Anesthesiologist: Elmer Picker, MD Preanesthetic Checklist Completed: patient identified, IV checked, risks and benefits discussed, surgical consent, monitors and equipment checked, pre-op evaluation and timeout performed Spinal Block Patient position: sitting Prep: DuraPrep and site prepped and draped Patient monitoring: cardiac monitor, continuous pulse ox and blood pressure Approach: midline Location: L3-4 Injection technique: single-shot Needle Needle type: Pencan  Needle gauge: 24 G Needle length: 9 cm Assessment Sensory level: T6 Additional Notes Functioning IV was confirmed and monitors were applied. Sterile prep and drape, including hand hygiene and sterile gloves were used. The patient was positioned and the spine was prepped. The skin was anesthetized with lidocaine.  Free flow of clear CSF was obtained prior to injecting local anesthetic into the CSF.  The spinal needle aspirated freely following injection.  The needle was carefully withdrawn.  The patient tolerated the procedure well.

## 2019-06-12 NOTE — MAU Provider Note (Addendum)
Patient Brianna Palmer is a 34 y.o. [id number]  at [redacted]w[redacted]d here with complaints of headache. She reports that she had visual changes when her blood pressure was high at MFM this morning but that she does not have any visual changes now. She reports that her vision was "spotty" this morning like a fuzzy TV but she denies that she has any kind of vision changes now.   She reports that she "feels bad" all over. She denies contractions, decreased fetal movements, VB, LOF. She has felt nauseated today.   Patient has a pregnancy complicated by IUGR; echogenic bowel, severe IUGR, elevated Dopplers. She is receiving weekly antenatal testing; planned repeat C/section with BTL.  She was sent over from [facility]FM for pre-e work-up.  History     CSN: [id number]  Arrival date and time: 06/12/19 1228   First Provider Initiated Contact with Patient 06/12/19 1358      Chief Complaint  Patient presents with  . Headache  . Hypertension  . Dizziness  . Nausea   Headache  This is a new problem. The current episode started yesterday. The problem occurs intermittently. The pain is located in the occipital region. The pain quality is not similar to prior headaches. Pain scale: "moderate" Associated symptoms include dizziness. Her past medical history is significant for hypertension.  Hypertension Associated symptoms include headaches.  Dizziness Associated symptoms include headaches.  She says she just "feels bad".   OB History    Gravida  6   Para  2   Term  2   Preterm      AB  3   Living  2     SAB  2   TAB  1   Ectopic      Multiple  0   Live Births  2           Past Medical History:  Diagnosis Date  . Headache   . HSV infection     Past Surgical History:  Procedure Laterality Date  . BUNIONECTOMY    . CESAREAN SECTION N/A 03/30/2012   Procedure: CESAREAN SECTION;  Surgeon: Antionette Char, MD;  Location: [facility];  Service: Obstetrics;  Laterality: N/A;  Primary Cesarean Section  Delivery Baby Boy @ 0157, Apgars 8/9  . CESAREAN SECTION N/A 04/22/2017   Procedure: CESAREAN SECTION;  Surgeon: Lesly Dukes, MD;  Location: [facility] BIRTHING SUITES;  Service: Obstetrics;  Laterality: N/A;    Family History  Adopted: Yes  Problem Relation Age of Onset  . Other Neg Hx   . Cancer Neg Hx   . Hypertension Neg Hx   . Stroke Neg Hx     Social History   Tobacco Use  . Smoking status: Former Smoker    Packs/day: 0.25    Types: Cigarettes    Quit date: 07/01/2013    Years since quitting: 5.9  . Smokeless tobacco: Never Used  Substance Use Topics  . Alcohol use: No    Alcohol/week: 0.0 standard drinks    Comment: socially  . Drug use: No    Allergies: No Known Allergies  Medications Prior to Admission  Medication Sig Dispense Refill Last Dose  . Prenatal Vit-Fe Fumarate-FA (PRENATAL VITAMINS PO) Take by mouth.   06/12/2019 at Unknown time  . valACYclovir (VALTREX) 500 MG tablet Take 1 tablet (500 mg total) by mouth 2 (two) times daily. Can also take two tablets by mouth at same time if tolerated 60 tablet 6 06/11/2019 at Unknown time  Review of Systems  Constitutional: Negative.   HENT: Negative.   Respiratory: Negative.   Cardiovascular: Negative.   Gastrointestinal: Negative.   Genitourinary: Negative.   Musculoskeletal: Negative.   Neurological: Positive for dizziness and headaches.   Physical Exam   Blood pressure 110/72, pulse 72, temperature 98.5 F (36.9 C), temperature source Oral, resp. rate 18, height 5\' 3"  (1.6 m), weight 87.2 kg, SpO2 100 %.  Physical Exam  Constitutional: She appears well-developed.  HENT:  Head: Normocephalic.  Cardiovascular: Normal rate.  Respiratory: Effort normal.  GI: Soft. She exhibits no distension. There is no abdominal tenderness. There is no rebound and no guarding.  Musculoskeletal:     Cervical back: Normal range of motion.  Skin: Skin is warm and dry.    MAU Course  Procedures  MDM -NST: 145 bpm, mod  var, present acel but at times tracing with only 10x10 acels, no decels, irregular contractions. IV bolus started; labs drawn and Dr. Nehemiah Settle aware of patient's presence on the unit.  -will try fioricet for headache -will draw pre-e labs> pre-e labs show significantly elevated LFTs and protein/creatine ratio of 0.63. Discussed with Dr. Donalee Citrin and Dr. Nehemiah Settle, who recommend delivery today. Will start mag at 6 grams, one dose of steroids and prepare for OR.  1500: Dr. Nehemiah Settle is at the bedside to assess patient Assessment and Plan  -Will hold in MAU until OR time of 1800.  -Continue to monitor FHR closely until patient transferred to OR, intervene as necessary.   Mervyn Skeeters Filmore Molyneux 06/12/2019, 2:01 PM

## 2019-06-12 NOTE — MAU Note (Signed)
Sent from MFM for further eval. Head is hurting, started last night, took something- went away but came back on the way to the dr's.  Feels nauseous and dizzy. Vision has the look of "static, on the old tv's". Denies epigastric pain, no changes in swelling.

## 2019-06-12 NOTE — MAU Note (Signed)
Called OB spec care to notify of pending c/s, pt is preterm and will be on Magnesium, pre-assigned to 115 by Megan.  Called Mamie Levers to notify pt going on Magnesium.  Called OB anesthesia, Dr Krista Blue was already aware of case. Marylene Land, [facility] notified.

## 2019-06-12 NOTE — H&P (Signed)
Faculty Practice H&P  Brianna Palmer is a 34 y.o. female [id number] with IUP at [redacted]w[redacted]d presenting to MAU for headache, found to have LFTs in the 900s/1000s. Pregnancy complicated by prior cesarean section, fetal US with echogenic bowel with cystic area, FGR with growth less than 1%.   Pt states she has been having no contractions, no vaginal bleeding, intact membranes, with normal fetal movement.     Prenatal Course Source of Care: [facility] with onset of care at 8weeks  Pregnancy complications or risks: Patient Active Problem List   Diagnosis Date Noted  . Pregnancy affected by fetal growth restriction 05/03/2019  . Suspected damage to fetus from other disease in mother, affecting management of mother, antepartum condition or complication   . Supervision of high risk pregnancy, antepartum 12/19/2018  . Status post repeat low transverse cesarean section 04/22/2017  . Migraine without aura and with status migrainosus, not intractable 11/05/2016  . Herpes genitalis in women 09/28/2016  . Depressive disorder, not elsewhere classified 06/08/2012   She desires bilateral tubal ligation for contraception.  She plans to breastfeed  Prenatal labs and studies: ABO, Rh: B/Positive/-- (12/08 1145) Antibody: Negative (12/08 1145) Rubella: 2.97 (12/08 1145) RPR: Non Reactive (04/23 1315)  HBsAg: Negative (12/08 1145)  HIV: Non Reactive (04/23 1315)  GBS:    2hr Glucola: negative Genetic screening: normal Anatomy US: echogenic bowel with cystic area  Past Medical History:  Past Medical History:  Diagnosis Date  . Headache   . HSV infection     Past Surgical History:  Past Surgical History:  Procedure Laterality Date  . BUNIONECTOMY    . CESAREAN SECTION N/A 03/30/2012   Procedure: CESAREAN SECTION;  Surgeon: Antionette Char, MD;  Location: [facility];  Service: Obstetrics;  Laterality: N/A;  Primary Cesarean Section Delivery Baby Boy @ 0157, Apgars 8/9  . CESAREAN SECTION N/A 04/22/2017    Procedure: CESAREAN SECTION;  Surgeon: Lesly Dukes, MD;  Location: [facility] BIRTHING SUITES;  Service: Obstetrics;  Laterality: N/A;    Obstetrical History:  OB History    Gravida  6   Para  2   Term  2   Preterm      AB  3   Living  2     SAB  2   TAB  1   Ectopic      Multiple  0   Live Births  2           Gynecological History:  OB History    Gravida  6   Para  2   Term  2   Preterm      AB  3   Living  2     SAB  2   TAB  1   Ectopic      Multiple  0   Live Births  2           Social History:  Social History   Socioeconomic History  . Marital status: Divorced    Spouse name: Not on file  . Number of children: 2  . Years of education: Not on file  . Highest education level: Not on file  Occupational History    Employer: [employer]  Tobacco Use  . Smoking status: Former Smoker    Packs/day: 0.25    Types: Cigarettes    Quit date: 07/01/2013    Years since quitting: 5.9  . Smokeless tobacco: Never Used  Substance and Sexual Activity  . Alcohol use: No  Alcohol/week: 0.0 standard drinks    Comment: socially  . Drug use: No  . Sexual activity: Yes    Partners: Male  Other Topics Concern  . Not on file  Social History Narrative  . Not on file   Social Determinants of Health   Financial Resource Strain:   . Difficulty of Paying Living Expenses:   Food Insecurity:   . Worried About Programme researcher, broadcasting/film/video in the Last Year:   . Barista in the Last Year:   Transportation Needs:   . Freight forwarder (Medical):   Marland Kitchen Lack of Transportation (Non-Medical):   Physical Activity:   . Days of Exercise per Week:   . Minutes of Exercise per Session:   Stress:   . Feeling of Stress :   Social Connections:   . Frequency of Communication with Friends and Family:   . Frequency of Social Gatherings with Friends and Family:   . Attends Religious Services:   . Active Member of Clubs or Organizations:   . Attends  Banker Meetings:   Marland Kitchen Marital Status:     Family History:  Family History  Adopted: Yes  Problem Relation Age of Onset  . Other Neg Hx   . Cancer Neg Hx   . Hypertension Neg Hx   . Stroke Neg Hx     Medications:  Prenatal vitamins,  Current Facility-Administered Medications  Medication Dose Route Frequency Provider Last Rate Last Admin  . ceFAZolin (ANCEF) IVPB 2g/100 mL premix  2 g Intravenous 30 min Pre-Op Markeshia Giebel J, DO      . famotidine (PEPCID) IVPB 20 mg premix  20 mg Intravenous Once Morgen Linebaugh J, DO      . lactated ringers bolus 1,000 mL  1,000 mL Intravenous Once Layza Summa J, DO      . magnesium bolus via infusion 6 g  6 g Intravenous Once Khamia Stambaugh J, DO      . magnesium sulfate 40 grams in SWI 1000 mL OB infusion  2 g/hr Intravenous Titrated Anjeli Casad J, DO      . sodium citrate-citric acid (ORACIT) solution 30 mL  30 mL Oral Once Levie Heritage, DO        Allergies: No Known Allergies  Review of Systems: - negative  Physical Exam: Blood pressure 125/87, pulse 77, temperature 98.5 F (36.9 C), temperature source Oral, resp. rate 18, height 5\' 3"  (1.6 m), weight 87.2 kg, SpO2 100 %. GENERAL: Well-developed, well-nourished female in no acute distress.  LUNGS: Clear to auscultation bilaterally.  HEART: Regular rate and rhythm. ABDOMEN: Soft, nontender, nondistended, gravid. EFW 3 lbs EXTREMITIES: Nontender, no edema, 2+ distal pulses. FHT:  Baseline rate 140 bpm   Variability moderate  Accelerations absent   Decelerations none Contractions: none   Pertinent Labs/Studies:   Lab Results  Component Value Date   WBC 11.5 (H) 06/12/2019   HGB 14.5 06/12/2019   HCT 43.3 06/12/2019   MCV 87.3 06/12/2019   PLT 212 06/12/2019    Assessment : Brianna Palmer is a 34 y.o. 34 at [redacted]w[redacted]d being admitted for cesarean section secondary to HELLP, FGR, prior cesarean section.  Plan: Discussed with Dr [redacted]w[redacted]d, who recommended  delivery. Start Magnesium, BMZ x1. NPO Ancef  The risks of cesarean section discussed with the patient included but were not limited to: bleeding which may require transfusion or reoperation; infection which may require antibiotics; injury to bowel, bladder, ureters or other surrounding organs;  injury to the fetus; need for additional procedures including hysterectomy in the event of a life-threatening hemorrhage; placental abnormalities wth subsequent pregnancies, incisional problems, thromboembolic phenomenon and other postoperative/anesthesia complications. The patient concurred with the proposed plan, giving informed written consent for the procedure.   Patient has been NPO since 10am and will remain NPO for procedure.  Preoperative prophylactic Ancef ordered on call to the OR.   Risks of procedure discussed with patient including but not limited to: risk of regret, permanence of method, bleeding, infection, injury to surrounding organs and need for additional procedures.  Failure risk of 1% with increased risk of ectopic gestation if pregnancy occurs was also discussed with patient.    Truett Mainland, DO 06/12/2019 4:18 PM

## 2019-06-12 NOTE — Transfer of Care (Signed)
Immediate Anesthesia Transfer of Care Note  Patient: Brianna Palmer  Procedure(s) Performed: CESAREAN SECTION WITH BILATERAL TUBAL LIGATION (N/A )  Patient Location: PACU  Anesthesia Type:Spinal  Level of Consciousness: awake, alert  and oriented  Airway & Oxygen Therapy: Patient Spontanous Breathing  Post-op Assessment: Report given to RN and Post -op Vital signs reviewed and stable  Post vital signs: Reviewed and stable  Last Vitals:  Vitals Value Taken Time  BP 107/71 06/12/19 1938  Temp    Pulse 65 06/12/19 1941  Resp 18 06/12/19 1941  SpO2 98 % 06/12/19 1941  Vitals shown include unvalidated device data.  Last Pain:  Vitals:   06/12/19 1605  TempSrc: Oral  PainSc:          Complications: No apparent anesthesia complications

## 2019-06-12 NOTE — Progress Notes (Unsigned)
Report called to [facility], RN in MAU for monitoring and H/A per Dr. Judeth Cornfield. BPP today 8/8. Pt states she has good fetal movement.

## 2019-06-13 ENCOUNTER — Encounter (HOSPITAL_COMMUNITY): Payer: Self-pay | Admitting: Family Medicine

## 2019-06-13 LAB — COMPREHENSIVE METABOLIC PANEL
ALT: 975 U/L — ABNORMAL HIGH (ref 0–44)
AST: 723 U/L — ABNORMAL HIGH (ref 15–41)
Albumin: 2.7 g/dL — ABNORMAL LOW (ref 3.5–5.0)
Alkaline Phosphatase: 161 U/L — ABNORMAL HIGH (ref 38–126)
Anion gap: 10 (ref 5–15)
BUN: 12 mg/dL (ref 6–20)
CO2: 19 mmol/L — ABNORMAL LOW (ref 22–32)
Calcium: 6.9 mg/dL — ABNORMAL LOW (ref 8.9–10.3)
Chloride: 105 mmol/L (ref 98–111)
Creatinine, Ser: 1.1 mg/dL — ABNORMAL HIGH (ref 0.44–1.00)
GFR calc Af Amer: >60 mL/min (ref >60)
GFR calc non Af Amer: >60 mL/min (ref >60)
Glucose, Bld: 130 mg/dL — ABNORMAL HIGH (ref 70–99)
Potassium: 4.5 mmol/L (ref 3.5–5.1)
Sodium: 134 mmol/L — ABNORMAL LOW (ref 135–145)
Total Bilirubin: 0.5 mg/dL (ref 0.3–1.2)
Total Protein: 7.5 g/dL (ref 6.5–8.1)

## 2019-06-13 LAB — CBC
HCT: 47.6 % — ABNORMAL HIGH (ref 36.0–46.0)
Hemoglobin: 15.7 g/dL — ABNORMAL HIGH (ref 12.0–15.0)
MCH: 28.9 pg (ref 26.0–34.0)
MCHC: 33 g/dL (ref 30.0–36.0)
MCV: 87.7 fL (ref 80.0–100.0)
Platelets: 250 10*3/uL (ref 150–400)
RBC: 5.43 MIL/uL — ABNORMAL HIGH (ref 3.87–5.11)
RDW: 14.4 % (ref 11.5–15.5)
WBC: 19.6 10*3/uL — ABNORMAL HIGH (ref 4.0–10.5)
nRBC: 0.5 % — ABNORMAL HIGH (ref 0.0–0.2)

## 2019-06-13 LAB — MAGNESIUM: Magnesium: 8.5 mg/dL (ref 1.7–2.4)

## 2019-06-13 LAB — RPR: RPR Ser Ql: NONREACTIVE

## 2019-06-13 MED ORDER — MAGNESIUM SULFATE 40 GM/1000ML IV SOLN
1.0000 g/h | INTRAVENOUS | Status: DC
  Administered 2019-06-13: 2 g/h via INTRAVENOUS
  Filled 2019-06-13: qty 1000

## 2019-06-13 NOTE — Progress Notes (Signed)
At 0325, patient to NICU via wheel chair with RN. Tolerated well.  Returned to room at 0400.  No c/o voiced.

## 2019-06-13 NOTE — Lactation Note (Signed)
This note was copied from a baby's chart. Lactation Consultation Note  Patient Name: Brianna Palmer EGBTD'V Date: 06/13/2019; Mother is a P3, infant is 17 hours old and born at 33+3 weeks gest. Mother reports that she has pumped twice and has not obtained any drops of colostrum.   Mother was given [facility] brochure and basic teaching done.   Reviewed hand expression with mother. No drops of colostrum when hand expressed. Mother was given a harmony hand pump with instructions. Mothers nipples are erect with compressible breast tissue. No observed trama of mothers nipples.   Mother has a DEBP sat up at the bedside. Mother reports that she is not active with [facility] this time. She reports that she has Medicaid and gets food stamps. She reports that she has a old Medela pump but feels that it is not working well .  WIC referral sent in [facility] in hopes to get a pump.  Encouraged mother to pump every 2-3 hours for 15 mins .   Mother to continue to due STS. Mother is aware of available LC services at [facility], BFSG'S, OP Dept, and phone # for questions or concerns about breastfeeding.  Mother receptive to all teaching and plan of care.       Maternal Data    Feeding    LATCH Score                   Interventions    Lactation Tools Discussed/Used     Consult Status      Michel Bickers 06/13/2019, 3:33 PM

## 2019-06-13 NOTE — Progress Notes (Signed)
Subjective: Postpartum Day 1: Cesarean Delivery Patient reports incisional pain and tolerating PO.    Objective: Vital signs in last 24 hours: Temp:  [96.8 F (36 C)-98.5 F (36.9 C)] 97.5 F (36.4 C) (06/02 0813) Pulse Rate:  [64-98] 78 (06/02 0813) Resp:  [13-20] 16 (06/02 0813) BP: (106-139)/(65-101) 110/78 (06/02 0813) SpO2:  [95 %-100 %] 99 % (06/02 0813) Weight:  [87.2 kg] 87.2 kg (06/01 1245)  Physical Exam:  General: alert, cooperative and no distress Lochia: appropriate Uterine Fundus: firm Incision: no significant drainage DVT Evaluation: No evidence of DVT seen on physical exam.  Recent Labs    06/12/19 2215 06/13/19 0609  HGB 14.5 15.7*  HCT 42.9 47.6*   Magnesium level was elevated and infusion rate was reduced Assessment/Plan: Status post Cesarean section. Postoperative course complicated by preeclampsia  D/c magnesium at 1800 Follow LFT , noted to be improved today.  Scheryl Darter 06/13/2019, 11:17 AM

## 2019-06-13 NOTE — Plan of Care (Signed)
  Problem: Education: Goal: Knowledge of General Education information will improve Description: Including pain rating scale, medication(s)/side effects and non-pharmacologic comfort measures Outcome: Completed/Met   Problem: Coping: Goal: Level of anxiety will decrease Outcome: Completed/Met   Problem: Coping: Goal: Ability to identify and utilize available resources and services will improve Outcome: Completed/Met   Problem: Role Relationship: Goal: Ability to demonstrate positive interaction with newborn will improve Outcome: Completed/Met   

## 2019-06-14 ENCOUNTER — Encounter: Payer: Self-pay | Admitting: *Deleted

## 2019-06-14 LAB — COMPREHENSIVE METABOLIC PANEL
ALT: 589 U/L — ABNORMAL HIGH (ref 0–44)
AST: 254 U/L — ABNORMAL HIGH (ref 15–41)
Albumin: 2.4 g/dL — ABNORMAL LOW (ref 3.5–5.0)
Alkaline Phosphatase: 113 U/L (ref 38–126)
Anion gap: 9 (ref 5–15)
BUN: 16 mg/dL (ref 6–20)
CO2: 22 mmol/L (ref 22–32)
Calcium: 6.9 mg/dL — ABNORMAL LOW (ref 8.9–10.3)
Chloride: 105 mmol/L (ref 98–111)
Creatinine, Ser: 1.01 mg/dL — ABNORMAL HIGH (ref 0.44–1.00)
GFR calc Af Amer: >60 mL/min (ref >60)
GFR calc non Af Amer: >60 mL/min (ref >60)
Glucose, Bld: 88 mg/dL (ref 70–99)
Potassium: 4.4 mmol/L (ref 3.5–5.1)
Sodium: 136 mmol/L (ref 135–145)
Total Bilirubin: 0.4 mg/dL (ref 0.3–1.2)
Total Protein: 6.4 g/dL — ABNORMAL LOW (ref 6.5–8.1)

## 2019-06-14 LAB — SURGICAL PATHOLOGY

## 2019-06-14 MED ORDER — IBUPROFEN 800 MG PO TABS: 800.0000 mg | ORAL_TABLET | Freq: Four times a day (QID) | ORAL | 0 refills | Status: DC

## 2019-06-14 MED ORDER — OXYCODONE HCL 5 MG PO TABS: 5.0000 mg | ORAL_TABLET | ORAL | 0 refills | Status: DC | PRN

## 2019-06-14 NOTE — Progress Notes (Signed)
DR Cindi Carbon to  Enter order  For cancelled discharge

## 2019-06-14 NOTE — Progress Notes (Signed)
CSW completed chart review and attempted to meet with MOB to complete psychosocial assessment; however, MOB was getting in the shower. CSW agreed to come back at a later time.  Celso Sickle, LCSW Clinical Social Worker [facility] Cell#: [phone]

## 2019-06-14 NOTE — Plan of Care (Signed)
  Problem: Health Behavior/Discharge Planning: Goal: Ability to manage health-related needs will improve Outcome: Completed/Met   Problem: Clinical Measurements: Goal: Respiratory complications will improve Outcome: Completed/Met Goal: Cardiovascular complication will be avoided Outcome: Completed/Met   Problem: Activity: Goal: Risk for activity intolerance will decrease Outcome: Completed/Met   Problem: Nutrition: Goal: Adequate nutrition will be maintained Outcome: Completed/Met   Problem: Elimination: Goal: Will not experience complications related to urinary retention Outcome: Completed/Met   Problem: Pain Managment: Goal: General experience of comfort will improve Outcome: Completed/Met   Problem: Safety: Goal: Ability to remain free from injury will improve Outcome: Completed/Met   Problem: Education: Goal: Knowledge of condition will improve Outcome: Completed/Met   Problem: Activity: Goal: Will verbalize the importance of balancing activity with adequate rest periods Outcome: Completed/Met Goal: Ability to tolerate increased activity will improve Outcome: Completed/Met   Problem: Life Cycle: Goal: Chance of risk for complications during the postpartum period will decrease Outcome: Completed/Met

## 2019-06-14 NOTE — Clinical Social Work Maternal (Signed)
CLINICAL SOCIAL WORK MATERNAL/CHILD NOTE  Patient Details  Name: Brianna Palmer MRN: [id number] Date of Birth: 12/21/1985  Date:  06/14/2019  Clinical Social Worker Initiating Note:  Abundio Miu, [place] Date/Time: Initiated:  06/14/19/1406     Child's Name:  Brianna Palmer   Biological Parents:  Mother, Father(Father: Carlyon Prows III)   Need for Interpreter:  None   Reason for Referral:  Parental Support of Premature Babies < 32 weeks/or Critically Ill babies   Address:  [address] [place] [address]    Phone number:  [phone] (home)     Additional phone number:   Household Members/Support Persons (HM/SP):   Household Member/Support Person 1, Household Member/Support Person 2   HM/SP Name Relationship DOB or Age  HM/SP -1 Carlyon Prows son 03/30/12  HM/SP -2 Dallas McLendon son 04/22/17  HM/SP -3        HM/SP -4        HM/SP -5        HM/SP -6        HM/SP -7        HM/SP -8          Natural Supports (not living in the home):  Immediate Family, Friends, Artist Supports: None   Employment: Ship broker   Type of Work:     Education:  Attending college   Homebound arranged:    Museum/gallery curator Resources:  [employer]   Other Resources:  [employer], Physicist, medical    Cultural/Religious Considerations Which May Impact Care:    Strengths:  Ability to meet basic needs , Understanding of illness, Pediatrician chosen   Psychotropic Medications:         Pediatrician:    [place]  Pediatrician List:   [facility] for [place]      Pediatrician Fax Number:    Risk Factors/Current Problems:  None   Cognitive State:      Mood/Affect:  Interested , Comfortable , Calm , Relaxed    CSW Assessment: CSW met with MOB in infant's room to discuss infant's NICU admission, FOB present and asleep. CSW introduced self and explained reason for  visit. MOB was welcoming, open and engaged during assessment. MOB reported that she resides with two older sons and is currently attending college. MOB shared that she is studying psychology. MOB reported that she receives both [facility] and food stamps. MOB reported that has started doing some shopping but has not got a car seat yet. CSW informed MOB about the hospital car seat program, MOB reported that she is interested. CSW agreed to provide a car seat closer to discharge. CSW inquired about MOB's support system besides FOB, MOB reported that her parents, FOB's grandparents and friends are her supports.   CSW and MOB discuss infant's NICU admission. CSW informed MOB about the NICU, what to expect and resources/supports available while infant is admitted to the NICU. MOB reported that she feels well informed about infant's care and that NICU staff has made her feel comfortable. CSW spoke with MOB about the NICU being a journey and can be comparable to a roller coaster ride with ups and downs. MOB verbalized understanding and reported that she plans to give herself a lot of grace as this is her first child in the NICU. CSW positively affirmed MOB's plan. MOB denied any questions/concerns regarding the  NICU. MOB denied any transportation barriers with visiting infant in the NICU. CSW informed MOB that infant qualifies to apply for SSI benefits, MOB reported that she interested. CSW informed MOB about SSI benefits and explained the application process. CSW provided MOB with paperwork about applying for SSI benefits. MOB verbalized plan to apply and denied any questions about SSI benefits and applying.   CSW asked for MOB's permission to speak about anything while FOB was asleep in the room, MOB granted CSW verbal permission. CSW inquired about MOB's mental health history, MOB reported that she experienced postpartum depression after her first child was born that lasted about 4 months. MOB reported that she had  anxiety, unrealistic fears and issues with having other help her care for her son. MOB reported that the symptoms went away without treatment and that she did not experience postpartum depression with her second son. MOB shared that she plans to start counseling soon with [facility] to prevent experiencing postpartum depression. CSW praised MOB for being proactive and having awareness. CSW inquired about how MOB was feeling emotionally after giving birth, MOB reported that she was feeling fine. MOB presented calm with a lot of insight about her mental health history. MOB did not demonstrate any acute mental health signs/symptoms. CSW assessed for safety, MOB denied SI and HI. CSW did not assess for domestic violence as FOB was present in the room.   CSW provided education regarding the baby blues period vs. perinatal mood disorders, discussed treatment and gave resources for mental health follow up if concerns arise.  CSW recommends self-evaluation during the postpartum time period using the New Mom Checklist from [employer]partum [employer] and encouraged MOB to contact a medical professional if symptoms are noted at any time. MOB was receptive to information and emphasized having a good support team to support her through this NICU journey.   CSW provided review of Sudden Infant Death Syndrome (SIDS) precautions. MOB verbalized understanding and reported that infant has a crib to sleep in.   CSW will continue to offer resources/supports while infant is admitted to the NICU.   CSW Plan/Description:  Perinatal Mood and Anxiety Disorder (PMADs) Education, Sudden Infant Death Syndrome (SIDS) Education, Theatre stage manager Income (SSI) Information, Other Patient/Family Education    Burnis Medin, LCSW 06/14/2019, 2:14 PM

## 2019-06-15 ENCOUNTER — Ambulatory Visit: Payer: Medicaid Other

## 2019-06-15 MED ORDER — HYDROCHLOROTHIAZIDE 25 MG PO TABS
25.0000 mg | ORAL_TABLET | Freq: Every day | ORAL | Status: DC
  Administered 2019-06-15: 25 mg via ORAL
  Filled 2019-06-15: qty 1

## 2019-06-15 MED ORDER — FLEET ENEMA 7-19 GM/118ML RE ENEM: 1.0000 | ENEMA | Freq: Once | RECTAL | Status: DC

## 2019-06-15 MED ORDER — HYDROCHLOROTHIAZIDE 25 MG PO TABS: 25.0000 mg | ORAL_TABLET | Freq: Every day | ORAL | 1 refills | Status: DC

## 2019-06-15 MED ORDER — BISACODYL 10 MG RE SUPP
10.0000 mg | Freq: Every day | RECTAL | Status: DC | PRN
  Administered 2019-06-15: 10 mg via RECTAL
  Filled 2019-06-15: qty 1

## 2019-06-15 NOTE — Discharge Instructions (Addendum)
HELLP Syndrome  HELLP syndrome is a severe complication of preeclampsia, a disorder of pregnancy that causes high blood pressure and other serious problems. In most cases, the syndrome develops before 35 weeks of pregnancy, but it can also develop right after childbirth. The condition is life-threatening to both the mother and baby. The letters in HELLP stand for these problems:  H - Hemolytic anemia, hemolysis. This refers to the destruction of blood cells.  EL - Elevated liver enzymes. This is a sign of liver damage.  LP - Low platelet count. This refers to blood cells that help stop bleeding. What are the causes? The cause of this condition is not known. What increases the risk? You are more likely to develop this condition if:  You are pregnant with more than one baby.  You have other medical conditions, such as diabetes or an autoimmune disease.  Any of these things happened during a previous pregnancy: ? You had preeclampsia or eclampsia. ? Your baby did not grow as expected. ? Your baby was born prematurely. ? Your placenta separated from your uterus (placental abruption). ? You lost your baby (fetal death). What are the signs or symptoms? Symptoms of this condition include:  Severe headache.  Blurry or double vision.  Pain in the upper right abdomen.  Pain in the shoulder, neck, and upper body.  Fatigue.  Feeling sick.  Seizures.  Swelling of the hands, face, legs, and feet.  Sudden weight gain.  High blood pressure. How is this diagnosed? This condition may be diagnosed with blood and urine tests. These tests may include:  A complete blood count.  A liver function test.  A kidney function test.  A measurement of the salts and other chemicals in your body (electrolytes).  A blood coagulation test.  Measurement of protein in the urine. You may also have other tests, including:  Checking your blood pressure.  Fetal ultrasound.  Monitoring  your baby's heart rate. How is this treated? This condition is treated by delivering the baby as soon as possible. You may be given medicines to start contractions (induce labor) so that the baby can be delivered vaginally, or you may have a cesarean delivery. Additional treatment may include:  Receiving an infusion of magnesium sulphate before delivery. Magnesium sulphate is a medicine that helps prevent seizures.  Receiving medicines to lower and control blood pressure. These may be given if your blood pressure is too high.  Receiving steroid hormones (corticosteroids) to help your baby's lungs mature faster. During your treatment, you and your baby will be monitored and your conditions will be managed. How is this prevented? If you are at risk for preeclampsia, your health care provider may recommend that you take one low-dose aspirin (a dose of 81 mg) each day during your pregnancy. This will help prevent high blood pressure. You may be at risk for preeclampsia if:  You had preeclampsia or eclampsia during a previous pregnancy.  Your baby did not grow as expected during a previous pregnancy.  One of your children was born prematurely.  You experienced a placental abruption during a previous pregnancy.  You lost your baby during a previous pregnancy.  You are pregnant with more than one baby.  You have a medical condition, such as diabetes or an autoimmune disease. Contact a health care provider if:  You gain more weight than expected.  You have headaches.  You have nausea or vomiting.  You have abdominal pain.  You feel dizzy or light-headed.  Get help right away if:  You have a seizure.  You develop sudden or severe swelling anywhere in your body. This usually happens in the legs.  You have changes in your ability to speak or move your body. Summary  HELLP syndrome is a severe complication of preeclampsia, a disorder of pregnancy that causes high blood pressure and  other serious problems.  In most cases, the syndrome develops before 35 weeks of pregnancy, but it can also develop right after childbirth. The condition is life-threatening to both the mother and baby.  This condition is treated by delivering the baby as soon as possible. You may be given medicines to start contractions (induce labor) so that the baby can be delivered vaginally, or you may have a cesarean delivery.  During your treatment, you and your baby will be monitored and your conditions will be managed. This information is not intended to replace advice given to you by your health care provider. Make sure you discuss any questions you have with your health care provider. Document Revised: 12/10/2016 Document Reviewed: 02/04/2016 Elsevier Patient Education  2020 [employer]   HELLP Syndrome  HELLP syndrome is a severe complication of preeclampsia, a disorder of pregnancy that causes high blood pressure and other serious problems. In most cases, the syndrome develops before 35 weeks of pregnancy, but it can also develop right after childbirth. The condition is life-threatening to both the mother and baby. The letters in HELLP stand for these problems:  H - Hemolytic anemia, hemolysis. This refers to the destruction of blood cells.  EL - Elevated liver enzymes. This is a sign of liver damage.  LP - Low platelet count. This refers to blood cells that help stop bleeding. What are the causes? The cause of this condition is not known. What increases the risk? You are more likely to develop this condition if:  You are pregnant with more than one baby.  You have other medical conditions, such as diabetes or an autoimmune disease.  Any of these things happened during a previous pregnancy: ? You had preeclampsia or eclampsia. ? Your baby did not grow as expected. ? Your baby was born prematurely. ? Your placenta separated from your uterus (placental abruption). ? You lost your  baby (fetal death). What are the signs or symptoms? Symptoms of this condition include:  Severe headache.  Blurry or double vision.  Pain in the upper right abdomen.  Pain in the shoulder, neck, and upper body.  Fatigue.  Feeling sick.  Seizures.  Swelling of the hands, face, legs, and feet.  Sudden weight gain.  High blood pressure. How is this diagnosed? This condition may be diagnosed with blood and urine tests. These tests may include:  A complete blood count.  A liver function test.  A kidney function test.  A measurement of the salts and other chemicals in your body (electrolytes).  A blood coagulation test.  Measurement of protein in the urine. You may also have other tests, including:  Checking your blood pressure.  Fetal ultrasound.  Monitoring your baby's heart rate. How is this treated? This condition is treated by delivering the baby as soon as possible. You may be given medicines to start contractions (induce labor) so that the baby can be delivered vaginally, or you may have a cesarean delivery. Additional treatment may include:  Receiving an infusion of magnesium sulphate before delivery. Magnesium sulphate is a medicine that helps prevent seizures.  Receiving medicines to lower and control blood pressure.  These may be given if your blood pressure is too high.  Receiving steroid hormones (corticosteroids) to help your baby's lungs mature faster. During your treatment, you and your baby will be monitored and your conditions will be managed. How is this prevented? If you are at risk for preeclampsia, your health care provider may recommend that you take one low-dose aspirin (a dose of 81 mg) each day during your pregnancy. This will help prevent high blood pressure. You may be at risk for preeclampsia if:  You had preeclampsia or eclampsia during a previous pregnancy.  Your baby did not grow as expected during a previous pregnancy.  One of your  children was born prematurely.  You experienced a placental abruption during a previous pregnancy.  You lost your baby during a previous pregnancy.  You are pregnant with more than one baby.  You have a medical condition, such as diabetes or an autoimmune disease. Contact a health care provider if:  You gain more weight than expected.  You have headaches.  You have nausea or vomiting.  You have abdominal pain.  You feel dizzy or light-headed. Get help right away if:  You have a seizure.  You develop sudden or severe swelling anywhere in your body. This usually happens in the legs.  You have changes in your ability to speak or move your body. Summary  HELLP syndrome is a severe complication of preeclampsia, a disorder of pregnancy that causes high blood pressure and other serious problems.  In most cases, the syndrome develops before 35 weeks of pregnancy, but it can also develop right after childbirth. The condition is life-threatening to both the mother and baby.  This condition is treated by delivering the baby as soon as possible. You may be given medicines to start contractions (induce labor) so that the baby can be delivered vaginally, or you may have a cesarean delivery.  During your treatment, you and your baby will be monitored and your conditions will be managed. This information is not intended to replace advice given to you by your health care provider. Make sure you discuss any questions you have with your health care provider. Document Revised: 12/10/2016 Document Reviewed: 02/04/2016 Elsevier Patient Education  2020 [employer] Postpartum Care After Cesarean Delivery This sheet gives you information about how to care for yourself from the time you deliver your baby to up to 6-12 weeks after delivery (postpartum period). Your health care provider may also give you more specific instructions. If you have problems or questions, contact your health care  provider. Follow these instructions at home: Medicines  Take over-the-counter and prescription medicines only as told by your health care provider.  If you were prescribed an antibiotic medicine, take it as told by your health care provider. Do not stop taking the antibiotic even if you start to feel better.  Ask your health care provider if the medicine prescribed to you: ? Requires you to avoid driving or using heavy machinery. ? Can cause constipation. You may need to take actions to prevent or treat constipation, such as:  Drink enough fluid to keep your urine pale yellow.  Take over-the-counter or prescription medicines.  Eat foods that are high in fiber, such as beans, whole grains, and fresh fruits and vegetables.  Limit foods that are high in fat and processed sugars, such as fried or sweet foods. Activity  Gradually return to your normal activities as told by your health care provider.  Avoid activities that take a lot  of effort and energy (are strenuous) until approved by your health care provider. Walking at a slow to moderate pace is usually safe. Ask your health care provider what activities are safe for you. ? Do not lift anything that is heavier than your baby or 10 lb (4.5 kg) as told by your health care provider. ? Do not vacuum, climb stairs, or drive a car for as long as told by your health care provider.  If possible, have someone help you at home until you are able to do your usual activities yourself.  Rest as much as possible. Try to rest or take naps while your baby is sleeping. Vaginal bleeding  It is normal to have vaginal bleeding (lochia) after delivery. Wear a sanitary pad to absorb vaginal bleeding and discharge. ? During the first week after delivery, the amount and appearance of lochia is often similar to a menstrual period. ? Over the next few weeks, it will gradually decrease to a dry, yellow-brown discharge. ? For most women, lochia stops  completely by 4-6 weeks after delivery. Vaginal bleeding can vary from woman to woman.  Change your sanitary pads frequently. Watch for any changes in your flow, such as: ? A sudden increase in volume. ? A change in color. ? Large blood clots.  If you pass a blood clot, save it and call your health care provider to discuss. Do not flush blood clots down the toilet before you get instructions from your health care provider.  Do not use tampons or douches until your health care provider says this is safe.  If you are not breastfeeding, your period should return 6-8 weeks after delivery. If you are breastfeeding, your period may return anytime between 8 weeks after delivery and the time that you stop breastfeeding. Perineal care   If your C-section (Cesarean section) was unplanned, and you were allowed to labor and push before delivery, you may have pain, swelling, and discomfort of the tissue between your vaginal opening and your anus (perineum). You may also have an incision in the tissue (episiotomy) or the tissue may have torn during delivery. Follow these instructions as told by your health care provider: ? Keep your perineum clean and dry as told by your health care provider. Use medicated pads and pain-relieving sprays and creams as directed. ? If you have an episiotomy or vaginal tear, check the area every day for signs of infection. Check for:  Redness, swelling, or pain.  Fluid or blood.  Warmth.  Pus or a bad smell. ? You may be given a squirt bottle to use instead of wiping to clean the perineum area after you go to the bathroom. As you start healing, you may use the squirt bottle before wiping yourself. Make sure to wipe gently. ? To relieve pain caused by an episiotomy, vaginal tear, or hemorrhoids, try taking a warm sitz bath 2-3 times a day. A sitz bath is a warm water bath that is taken while you are sitting down. The water should only come up to your hips and should cover  your buttocks. Breast care  Within the first few days after delivery, your breasts may feel heavy, full, and uncomfortable (breast engorgement). You may also have milk leaking from your breasts. Your health care provider can suggest ways to help relieve breast discomfort. Breast engorgement should go away within a few days.  If you are breastfeeding: ? Wear a bra that supports your breasts and fits you well. ? Keep your  nipples clean and dry. Apply creams and ointments as told by your health care provider. ? You may need to use breast pads to absorb milk leakage. ? You may have uterine contractions every time you breastfeed for several weeks after delivery. Uterine contractions help your uterus return to its normal size. ? If you have any problems with breastfeeding, work with your health care provider or a Science writer.  If you are not breastfeeding: ? Avoid touching your breasts as this can make your breasts produce more milk. ? Wear a well-fitting bra and use cold packs to help with swelling. ? Do not squeeze out (express) milk. This causes you to make more milk. Intimacy and sexuality  Ask your health care provider when you can engage in sexual activity. This may depend on your: ? Risk of infection. ? Healing rate. ? Comfort and desire to engage in sexual activity.  You are able to get pregnant after delivery, even if you have not had your period. If desired, talk with your health care provider about methods of family planning or birth control (contraception). Lifestyle  Do not use any products that contain nicotine or tobacco, such as cigarettes, e-cigarettes, and chewing tobacco. If you need help quitting, ask your health care provider.  Do not drink alcohol, especially if you are breastfeeding. Eating and drinking   Drink enough fluid to keep your urine pale yellow.  Eat high-fiber foods every day. These may help prevent or relieve constipation. High-fiber foods  include: ? Whole grain cereals and breads. ? Brown rice. ? Beans. ? Fresh fruits and vegetables.  Take your prenatal vitamins until your postpartum checkup or until your health care provider tells you it is okay to stop. General instructions  Keep all follow-up visits for you and your baby as told by your health care provider. Most women visit their health care provider for a postpartum checkup within the first 3-6 weeks after delivery. Contact a health care provider if you:  Feel unable to cope with the changes that a new baby brings to your life, and these feelings do not go away.  Feel unusually sad or worried.  Have breasts that are painful, hard, or turn red.  Have a fever.  Have trouble holding urine or keeping urine from leaking.  Have little or no interest in activities you used to enjoy.  Have not breastfed at all and you have not had a menstrual period for 12 weeks after delivery.  Have stopped breastfeeding and you have not had a menstrual period for 12 weeks after you stopped breastfeeding.  Have questions about caring for yourself or your baby.  Pass a blood clot from your vagina. Get help right away if you:  Have chest pain.  Have difficulty breathing.  Have sudden, severe leg pain.  Have severe pain or cramping in your abdomen.  Bleed from your vagina so much that you fill more than one sanitary pad in one hour. Bleeding should not be heavier than your heaviest period.  Develop a severe headache.  Faint.  Have blurred vision or spots in your vision.  Have a bad-smelling vaginal discharge.  Have thoughts about hurting yourself or your baby. If you ever feel like you may hurt yourself or others, or have thoughts about taking your own life, get help right away. You can go to your nearest emergency department or call:  Your local emergency services (911 in the [place]  A suicide crisis helpline, such as the [place] Su[facility]  [facility] Lifeline at  (860) 191-7124. This is open 24 hours a day. Summary  The period of time from when you deliver your baby to up to 6-12 weeks after delivery is called the postpartum period.  Gradually return to your normal activities as told by your health care provider.  Keep all follow-up visits for you and your baby as told by your health care provider. This information is not intended to replace advice given to you by your health care provider. Make sure you discuss any questions you have with your health care provider. Document Revised: 08/17/2017 Document Reviewed: 08/17/2017 Elsevier Patient Education  2020 [employer].

## 2019-06-15 NOTE — Lactation Note (Signed)
This note was copied from a baby's chart. Lactation Consultation Note  Patient Name: Brianna Palmer KGYJE'H Date: 06/15/2019  P3. 58 hour Preterm infant. Per mom, she been having pain when using DEBP. LC observed mom need a larger breast flange mom re-fitted with size 27 mm breast flange. Mom concern she doesn't have colostrum, LC discussed hand expression and mom expressed 2 mls in bullet that RN with take NICU. Mom was given coconut oil by RN due breast soreness.    Maternal Data    Feeding Feeding Type: Donor Breast Milk  LATCH Score                   Interventions    Lactation Tools Discussed/Used     Consult Status      Danelle Earthly 06/15/2019, 5:35 AM

## 2019-06-15 NOTE — Lactation Note (Signed)
This note was copied from a baby's chart. Lactation Consultation Note  Patient Name: Brianna Palmer ENIDP'O Date: 06/15/2019 Reason for consult: Follow-up assessment;NICU baby  1802 - 1812 - I followed up with [name] today. Her son, Brianna Palmer is in the NICU. She has breast fed her previous two children, now 47 and 33 years old, for 13 months each. At her last pump, she expressed 5 mls; she was about to take that to her son in the NICU. She is pleased to see her milk transitioning.  [name] stated that she has not had to be reliant on a pump before. Her hope is to exclusively breast feed Brianna Palmer, and she wanted to know if he would still latch after being in the NICU. I reassured her that we would monitor his readiness and assist with transitioning him to the breast.  [name] states that she is being discharged tonight. She has been in touch with WIC, but she has not yet received a WIC pump. I offered to provide her with a [facility] loaner pump. She called her support person to ask him to bring a $30 cash deposit. I encouraged her to call lactation when ready for [place] to make the transaction.  She verbalized understanding.  I also reassured her that I would be working in the NICU this weekend if she would like follow up there as well.  Maternal Data Does the patient have breastfeeding experience prior to this delivery?: Yes  Feeding Feeding Type: Donor Breast Milk  Interventions Interventions: Breast feeding basics reviewed  Lactation Tools Discussed/Used WIC Program: Yes   Consult Status Consult Status: Follow-up Date: 06/16/19 Follow-up type: In-patient    Walker Shadow 06/15/2019, 6:19 PM

## 2019-06-17 ENCOUNTER — Other Ambulatory Visit: Payer: Self-pay

## 2019-06-17 ENCOUNTER — Inpatient Hospital Stay (HOSPITAL_COMMUNITY)
Admission: AD | Admit: 2019-06-17 | Discharge: 2019-06-17 | Disposition: A | Discharge status: Home / Self Care | Payer: Medicaid Other | Attending: Obstetrics and Gynecology | Admitting: Obstetrics and Gynecology

## 2019-06-17 ENCOUNTER — Encounter (HOSPITAL_COMMUNITY): Payer: Self-pay | Admitting: Obstetrics and Gynecology

## 2019-06-17 DIAGNOSIS — R1011 Right upper quadrant pain: Secondary | ICD-10-CM | POA: Diagnosis not present

## 2019-06-17 DIAGNOSIS — K5903 Drug induced constipation: Secondary | ICD-10-CM

## 2019-06-17 DIAGNOSIS — Z79899 Other long term (current) drug therapy: Secondary | ICD-10-CM | POA: Insufficient documentation

## 2019-06-17 DIAGNOSIS — R1012 Left upper quadrant pain: Secondary | ICD-10-CM | POA: Diagnosis present

## 2019-06-17 DIAGNOSIS — Z87891 Personal history of nicotine dependence: Secondary | ICD-10-CM | POA: Diagnosis not present

## 2019-06-17 DIAGNOSIS — R141 Gas pain: Secondary | ICD-10-CM | POA: Diagnosis not present

## 2019-06-17 DIAGNOSIS — K5909 Other constipation: Secondary | ICD-10-CM | POA: Diagnosis not present

## 2019-06-17 DIAGNOSIS — Z98891 History of uterine scar from previous surgery: Secondary | ICD-10-CM | POA: Diagnosis not present

## 2019-06-17 LAB — COMPREHENSIVE METABOLIC PANEL
ALT: 220 U/L — ABNORMAL HIGH (ref 0–44)
AST: 66 U/L — ABNORMAL HIGH (ref 15–41)
Albumin: 2.7 g/dL — ABNORMAL LOW (ref 3.5–5.0)
Alkaline Phosphatase: 97 U/L (ref 38–126)
Anion gap: 8 (ref 5–15)
BUN: 12 mg/dL (ref 6–20)
CO2: 27 mmol/L (ref 22–32)
Calcium: 9.2 mg/dL (ref 8.9–10.3)
Chloride: 102 mmol/L (ref 98–111)
Creatinine, Ser: 0.99 mg/dL (ref 0.44–1.00)
GFR calc Af Amer: >60 mL/min (ref >60)
GFR calc non Af Amer: >60 mL/min (ref >60)
Glucose, Bld: 72 mg/dL (ref 70–99)
Potassium: 3.4 mmol/L — ABNORMAL LOW (ref 3.5–5.1)
Sodium: 137 mmol/L (ref 135–145)
Total Bilirubin: <0.1 mg/dL — ABNORMAL LOW (ref 0.3–1.2)
Total Protein: 6.8 g/dL (ref 6.5–8.1)

## 2019-06-17 LAB — CBC
HCT: 40.7 % (ref 36.0–46.0)
Hemoglobin: 13.6 g/dL (ref 12.0–15.0)
MCH: 29.2 pg (ref 26.0–34.0)
MCHC: 33.4 g/dL (ref 30.0–36.0)
MCV: 87.3 fL (ref 80.0–100.0)
Platelets: 281 10*3/uL (ref 150–400)
RBC: 4.66 MIL/uL (ref 3.87–5.11)
RDW: 14.7 % (ref 11.5–15.5)
WBC: 15.5 10*3/uL — ABNORMAL HIGH (ref 4.0–10.5)
nRBC: 0 % (ref 0.0–0.2)

## 2019-06-17 MED ORDER — DOCUSATE SODIUM 100 MG PO CAPS
100.0000 mg | ORAL_CAPSULE | Freq: Two times a day (BID) | ORAL | 0 refills | Status: DC | PRN
Start: 2019-06-17 — End: 2019-06-28

## 2019-06-17 NOTE — MAU Provider Note (Signed)
History     CSN: [id number]  Arrival date and time: 06/17/19 1101   First Provider Initiated Contact with Patient 06/17/19 1200      Chief Complaint  Patient presents with  . Abdominal Pain   Brianna Palmer is a 34 y.o. [id number] who is 6 days S/P LTCS she is here today with bilateral upper abdominal pain. She states that she has had this pain since about 1 day post-op. She is also having gas pain, but this pain is different. She is able to pass gas. Last BM was 06/14/2019 after a suppository. She is taking ibuprofen and oxycodone for pain at home. She reports that this does help the pain when she takes it.   Abdominal Pain This is a new problem. The current episode started in the past 7 days. The problem occurs intermittently. The problem has been unchanged. The pain is located in the LUQ and RUQ. The pain is at a severity of 8/10. The abdominal pain does not radiate. Pertinent negatives include no dysuria, fever, frequency, nausea or vomiting.    OB History    Gravida  6   Para  3   Term  2   Preterm  1   AB  3   Living  3     SAB  2   TAB  1   Ectopic      Multiple  0   Live Births  3           Past Medical History:  Diagnosis Date  . Headache   . HSV infection     Past Surgical History:  Procedure Laterality Date  . BUNIONECTOMY    . CESAREAN SECTION N/A 03/30/2012   Procedure: CESAREAN SECTION;  Surgeon: Antionette Char, MD;  Location: [facility]S;  Service: Obstetrics;  Laterality: N/A;  Primary Cesarean Section Delivery Baby Boy @ 0157, Apgars 8/9  . CESAREAN SECTION N/A 04/22/2017   Procedure: CESAREAN SECTION;  Surgeon: Lesly Dukes, MD;  Location: [facility]IRTHING SUIT[facility];  Service: Obstetrics;  Laterality: N/A;  . CESAREAN SECTION WITH BILATERAL TUBAL LIGATION N/A 06/12/2019   Procedure: CESAREAN SECTION WITH BILATERAL TUBAL LIGATION;  Surgeon: Levie Heritage, DO;  Location: [facility] LD ORS;  Service: Obstetrics;  Laterality: N/A;    Family History   Adopted: Yes  Problem Relation Age of Onset  . Other Neg Hx   . Cancer Neg Hx   . Hypertension Neg Hx   . Stroke Neg Hx     Social History   Tobacco Use  . Smoking status: Former Smoker    Packs/day: 0.25    Types: Cigarettes    Quit date: 07/01/2013    Years since quitting: 5.9  . Smokeless tobacco: Never Used  Substance Use Topics  . Alcohol use: No    Alcohol/week: 0.0 standard drinks    Comment: socially  . Drug use: No    Allergies: No Known Allergies  Medications Prior to Admission  Medication Sig Dispense Refill Last Dose  . hydrochlorothiazide (HYDRODIURIL) 25 MG tablet Take 1 tablet (25 mg total) by mouth daily. 20 tablet 1 06/17/2019 at Unknown time  . ibuprofen (ADVIL) 800 MG tablet Take 1 tablet (800 mg total) by mouth every 6 (six) hours. 30 tablet 0 06/17/2019 at Unknown time  . oxyCODONE (OXY IR/ROXICODONE) 5 MG immediate release tablet Take 1-2 tablets (5-10 mg total) by mouth every 4 (four) hours as needed for moderate pain. 30 tablet 0 06/17/2019 at 0800   .  Prenatal Vit-Fe Fumarate-FA (PRENATAL VITAMINS PO) Take by mouth.   06/17/2019 at Unknown time    Review of Systems  Constitutional: Negative for chills and fever.  Gastrointestinal: Positive for abdominal pain. Negative for nausea and vomiting.  Genitourinary: Positive for vaginal bleeding (normal lochia ). Negative for dysuria, frequency and pelvic pain.   Physical Exam   Blood pressure 110/71, pulse (!) 103, temperature 98.1 F (36.7 C), temperature source Oral, resp. rate 16, SpO2 98 %, unknown if currently breastfeeding.  Physical Exam  Nursing note and vitals reviewed. Constitutional: She is oriented to person, place, and time. She appears well-developed and well-nourished. No distress.  HENT:  Head: Normocephalic.  Cardiovascular: Normal rate.  Respiratory: Effort normal.  GI: Soft. There is abdominal tenderness. There is rebound.  Neurological: She is alert and oriented to person, place, and  time.  Skin: Skin is warm and dry.  Psychiatric: She has a normal mood and affect.     Results for orders placed or performed during the hospital encounter of 06/17/19 (from the past 24 hour(s))  CBC     Status: Abnormal   Collection Time: 06/17/19 12:13 PM  Result Value Ref Range   WBC 15.5 (H) 4.0 - 10.5 K/uL   RBC 4.66 3.87 - 5.11 MIL/uL   Hemoglobin 13.6 12.0 - 15.0 g/dL   HCT 16.1 09.6 - 04.5 %   MCV 87.3 80.0 - 100.0 fL   MCH 29.2 26.0 - 34.0 pg   MCHC 33.4 30.0 - 36.0 g/dL   RDW 40.9 81.1 - 91.4 %   Platelets 281 150 - 400 K/uL   nRBC 0.0 0.0 - 0.2 %  Comprehensive metabolic panel     Status: Abnormal   Collection Time: 06/17/19 12:13 PM  Result Value Ref Range   Sodium 137 135 - 145 mmol/L   Potassium 3.4 (L) 3.5 - 5.1 mmol/L   Chloride 102 98 - 111 mmol/L   CO2 27 22 - 32 mmol/L   Glucose, Bld 72 70 - 99 mg/dL   BUN 12 6 - 20 mg/dL   Creatinine, Ser 7.82 0.44 - 1.00 mg/dL   Calcium 9.2 8.9 - 95.6 mg/dL   Total Protein 6.8 6.5 - 8.1 g/dL   Albumin 2.7 (L) 3.5 - 5.0 g/dL   AST 66 (H) 15 - 41 U/L   ALT 220 (H) 0 - 44 U/L   Alkaline Phosphatase 97 38 - 126 U/L   Total Bilirubin <0.1 (L) 0.3 - 1.2 mg/dL   GFR calc non Af Amer >60 >60 mL/min   GFR calc Af Amer >60 >60 mL/min   Anion gap 8 5 - 15     MAU Course  Procedures  MDM Patient's liver enzymes have improved. Pain has improved with oxycodone, and patient reports that this generally helps the pain at home as well. Offered enema, and patient states she has one at home. She would prefer to do the enema at home.   Assessment and Plan   1. Left upper quadrant abdominal pain   2. Drug-induced constipation   3. Status post cesarean section    DC home Comfort measures reviewed  Bleeding precautions RX: colace BID PRN #[id number]  Return to MAU as needed FU with OB as planned  Follow-up Information    [facility] Follow up.   Specialty: Obstetrics and Gynecology Contact  information: [address] [place] [address] [phone]  Marcille Buffy DNP, CNM  06/17/19  2:19 PM

## 2019-06-17 NOTE — MAU Note (Signed)
Brianna Palmer is a 34 y.o. here in MAU reporting: had a c/s on June 1. Since she had her surgery she has been having pain on both sides of her abdomen. It radiates from the middle to the top on both sides. States she is having some gas pain and incision pain but she is not as concerned about those. States bleeding is minimal.  Onset of complaint: ongoing  Pain score: 7/10  Vitals:   06/17/19 1128  BP: 110/71  Pulse: (!) 103  Resp: 16  Temp: 98.1 F (36.7 C)  SpO2: 98%     Lab orders placed from triage: none

## 2019-06-17 NOTE — Discharge Instructions (Signed)

## 2019-06-18 ENCOUNTER — Other Ambulatory Visit: Payer: Self-pay

## 2019-06-19 ENCOUNTER — Ambulatory Visit: Payer: Medicaid Other

## 2019-06-19 ENCOUNTER — Other Ambulatory Visit: Payer: Self-pay

## 2019-06-21 ENCOUNTER — Ambulatory Visit: Payer: Self-pay

## 2019-06-21 NOTE — Lactation Note (Signed)
This note was copied from a baby's chart. Lactation Consultation Note  Patient Name: Brianna Palmer OVFIE'P Date: 06/21/2019 Reason for consult: Follow-up assessment;NICU baby;Infant < 6lbs;Late-preterm 34-36.6wks  RN requested [facility] consult.  Mom is desiring to exclusively breastfeed when the time comes.  Baby is currently being gavage fed fortified EBM.  Baby asleep in isolette.  LC in to visit with P3 Mom of [redacted]w[redacted]d AGA baby at 5 days old.  Mom pumping while seated on couch, using her Medela Pump in Style DEBP.  This pump was used for her 34 yr old and 2 yr old.  Mom concerned about her milk supply, expressing about 30-40 ml per pumping.    LC set up Symphony DEBP in room and encouraged Mom to use this pump whenever she is with baby.  Explained that this is a hospital grade pump which is beneficial at supporting a full milk supply.  Mom also has [employer] office).  Encouraged Mom to follow-up with [facility] as they would loan her a Symphony pump for home use.  They had called, but she thought she had to pay for pump.  Referral sent.  Gave Mom a belly-band to create a hand's free pumping bra.  Mom was wearing an underwire bra which LC explained can lead to plugged ducts if wire compresses the breast tissue.  Mom tried pumping using the belly-band and she was pleased.  Encouraged hand's on pumping during her session.    Washing and drying bins set up on counter and Mom aware of importance of washing, rinsing and air drying pump parts after each use.  Encouraged STS when allowed.  Baby is currently 3 lbs.  Reassured Mom that baby would grow and mature, and support is available to help her with breastfeeding Jacquenette Shone.   Interventions Interventions: Breast feeding basics reviewed;Breast massage;Skin to skin;Hand express;DEBP  Lactation Tools Discussed/Used Tools: Pump;Flanges Flange Size: 27 Breast pump type: Double-Electric Breast Pump WIC Program: Yes Pump Review: Setup, frequency,  and cleaning;Milk Storage (set up NICU DEBP for Mom) Initiated by:: Erby Pian RN IBCLC Date initiated:: 06/21/19   Consult Status Consult Status: Follow-up Date: 06/28/19 Follow-up type: In-patient    Judee Clara 06/21/2019, 12:43 PM

## 2019-06-22 ENCOUNTER — Ambulatory Visit: Payer: Medicaid Other | Admitting: Family Medicine

## 2019-06-22 ENCOUNTER — Other Ambulatory Visit: Payer: Self-pay

## 2019-06-22 ENCOUNTER — Ambulatory Visit: Payer: Medicaid Other

## 2019-06-22 ENCOUNTER — Other Ambulatory Visit: Payer: Medicaid Other

## 2019-06-22 ENCOUNTER — Encounter (HOSPITAL_BASED_OUTPATIENT_CLINIC_OR_DEPARTMENT_OTHER): Payer: Self-pay | Admitting: Emergency Medicine

## 2019-06-22 ENCOUNTER — Emergency Department (HOSPITAL_BASED_OUTPATIENT_CLINIC_OR_DEPARTMENT_OTHER): Payer: Medicaid Other

## 2019-06-22 ENCOUNTER — Emergency Department (HOSPITAL_BASED_OUTPATIENT_CLINIC_OR_DEPARTMENT_OTHER)
Admission: EM | Admit: 2019-06-22 | Discharge: 2019-06-22 | Disposition: A | Discharge status: Home / Self Care | Payer: Medicaid Other | Attending: Emergency Medicine | Admitting: Emergency Medicine

## 2019-06-22 DIAGNOSIS — R519 Headache, unspecified: Secondary | ICD-10-CM

## 2019-06-22 DIAGNOSIS — I10 Essential (primary) hypertension: Secondary | ICD-10-CM

## 2019-06-22 DIAGNOSIS — G44221 Chronic tension-type headache, intractable: Secondary | ICD-10-CM | POA: Diagnosis not present

## 2019-06-22 DIAGNOSIS — Z20822 Contact with and (suspected) exposure to covid-19: Secondary | ICD-10-CM | POA: Diagnosis not present

## 2019-06-22 DIAGNOSIS — Z87891 Personal history of nicotine dependence: Secondary | ICD-10-CM | POA: Insufficient documentation

## 2019-06-22 DIAGNOSIS — H539 Unspecified visual disturbance: Secondary | ICD-10-CM | POA: Diagnosis present

## 2019-06-22 LAB — CBC WITH DIFFERENTIAL/PLATELET
Abs Immature Granulocytes: 0.04 10*3/uL (ref 0.00–0.07)
Basophils Absolute: 0 10*3/uL (ref 0.0–0.1)
Basophils Relative: 0 %
Eosinophils Absolute: 0.2 10*3/uL (ref 0.0–0.5)
Eosinophils Relative: 2 %
HCT: 38.6 % (ref 36.0–46.0)
Hemoglobin: 13 g/dL (ref 12.0–15.0)
Immature Granulocytes: 0 %
Lymphocytes Relative: 28 %
Lymphs Abs: 3.7 10*3/uL (ref 0.7–4.0)
MCH: 29.4 pg (ref 26.0–34.0)
MCHC: 33.7 g/dL (ref 30.0–36.0)
MCV: 87.3 fL (ref 80.0–100.0)
Monocytes Absolute: 1.1 10*3/uL — ABNORMAL HIGH (ref 0.1–1.0)
Monocytes Relative: 8 %
Neutro Abs: 8 10*3/uL — ABNORMAL HIGH (ref 1.7–7.7)
Neutrophils Relative %: 62 %
Platelets: 327 10*3/uL (ref 150–400)
RBC: 4.42 MIL/uL (ref 3.87–5.11)
RDW: 14.6 % (ref 11.5–15.5)
WBC: 13.1 10*3/uL — ABNORMAL HIGH (ref 4.0–10.5)
nRBC: 0 % (ref 0.0–0.2)

## 2019-06-22 LAB — URINALYSIS, ROUTINE W REFLEX MICROSCOPIC
Bilirubin Urine: NEGATIVE
Glucose, UA: NEGATIVE mg/dL
Ketones, ur: NEGATIVE mg/dL
Leukocytes,Ua: NEGATIVE
Nitrite: NEGATIVE
Protein, ur: NEGATIVE mg/dL
Specific Gravity, Urine: <1.005 — ABNORMAL LOW (ref 1.005–1.030)
pH: 7 (ref 5.0–8.0)

## 2019-06-22 LAB — COMPREHENSIVE METABOLIC PANEL
ALT: 59 U/L — ABNORMAL HIGH (ref 0–44)
AST: 32 U/L (ref 15–41)
Albumin: 3.1 g/dL — ABNORMAL LOW (ref 3.5–5.0)
Alkaline Phosphatase: 90 U/L (ref 38–126)
Anion gap: 12 (ref 5–15)
BUN: 12 mg/dL (ref 6–20)
CO2: 28 mmol/L (ref 22–32)
Calcium: 8.4 mg/dL — ABNORMAL LOW (ref 8.9–10.3)
Chloride: 98 mmol/L (ref 98–111)
Creatinine, Ser: 0.8 mg/dL (ref 0.44–1.00)
GFR calc Af Amer: >60 mL/min (ref >60)
GFR calc non Af Amer: >60 mL/min (ref >60)
Glucose, Bld: 98 mg/dL (ref 70–99)
Potassium: 3 mmol/L — ABNORMAL LOW (ref 3.5–5.1)
Sodium: 138 mmol/L (ref 135–145)
Total Bilirubin: 0.4 mg/dL (ref 0.3–1.2)
Total Protein: 7.4 g/dL (ref 6.5–8.1)

## 2019-06-22 LAB — URINALYSIS, MICROSCOPIC (REFLEX)

## 2019-06-22 LAB — SARS CORONAVIRUS 2 BY RT PCR (HOSPITAL ORDER, PERFORMED IN ~~LOC~~ HOSPITAL LAB): SARS Coronavirus 2: NEGATIVE

## 2019-06-22 MED ORDER — METOCLOPRAMIDE HCL 5 MG/ML IJ SOLN
10.0000 mg | Freq: Once | INTRAMUSCULAR | Status: AC
  Administered 2019-06-22: 10 mg via INTRAVENOUS
  Filled 2019-06-22: qty 2

## 2019-06-22 MED ORDER — LABETALOL HCL 5 MG/ML IV SOLN
10.0000 mg | Freq: Once | INTRAVENOUS | Status: AC
  Administered 2019-06-22: 10 mg via INTRAVENOUS
  Filled 2019-06-22: qty 4

## 2019-06-22 MED ORDER — KETOROLAC TROMETHAMINE 30 MG/ML IJ SOLN
30.0000 mg | Freq: Once | INTRAMUSCULAR | Status: AC
  Administered 2019-06-22: 30 mg via INTRAVENOUS
  Filled 2019-06-22: qty 1

## 2019-06-22 MED ORDER — LABETALOL HCL 5 MG/ML IV SOLN: 10.0000 mg | Freq: Once | INTRAVENOUS | Status: DC

## 2019-06-22 NOTE — ED Triage Notes (Signed)
Pt reports left sided headache since yesterday. C-section earlier this month. Hypertensive in triage. Hx of headaches, however this one is different. Reports "spots" to vision, nausea. Using oxycodone at home with no relief.

## 2019-06-22 NOTE — Discharge Instructions (Signed)
You may alternate Tylenol 1000 mg every 6 hours as needed for pain and Ibuprofen 800 mg every 8 hours as needed for pain.  Please take Ibuprofen with food.  Do not take more than 4000 mg of Tylenol (acetaminophen) in a 24 hour period.   Please continue your HCTZ 25 mg daily.  OB/GYN on-call last night did not recommend adjusting your blood pressure medication at this time but did recommend close follow-up with your obstetrician at the beginning of next week.  Please call to schedule an appointment.  Your labs were very reassuring today and your head CT was normal.  There was no protein in your urine today.  Your liver function tests have improved.

## 2019-06-22 NOTE — ED Provider Notes (Signed)
TIME SEEN: 5:21 AM  CHIEF COMPLAINT: Headache, vision changes  HPI: Patient is a 34 year old female with history of chronic headaches who presents to the emergency department with complaints of severe throbbing left-sided headache that radiates to the posterior part of her head that started yesterday.  No fevers, neck pain neck stiffness, numbness, weakness.  No head injury.  Not on blood thinners.  She reports also seeing flashers, floaters and blurry vision but no vision loss.  No chest pain or shortness of breath.  Noted that her blood pressure was elevated tonight.  Patient delivered at 33 weeks 3 days by C-section on 06/12/2019 by Dr. Adrian Blackwater due to HELLP.  Was discharged on HCTZ 25 mg daily she states for swelling.  Reports the swelling in her legs has improved.  No history of cardiomyopathy.  She feels her abdominal incision is healing well without bleeding or drainage.  Reports her bleeding is slowly improving as well.  She is currently pumping and breast-feeding.  Her baby is currently in the NICU.  ROS: See HPI Constitutional: no fever  Eyes: no drainage  ENT: no runny nose   Cardiovascular:  no chest pain  Resp: no SOB  GI: no vomiting GU: no dysuria Integumentary: no rash  Allergy: no hives  Musculoskeletal: no leg swelling  Neurological: no slurred speech ROS otherwise negative  PAST MEDICAL HISTORY/PAST SURGICAL HISTORY:  Past Medical History:  Diagnosis Date  . Headache   . HSV infection     MEDICATIONS:  Prior to Admission medications   Medication Sig Start Date End Date Taking? Authorizing Provider  docusate sodium (COLACE) 100 MG capsule Take 1 capsule (100 mg total) by mouth 2 (two) times daily as needed for mild constipation. 06/17/19   Armando Reichert, CNM  hydrochlorothiazide (HYDRODIURIL) 25 MG tablet Take 1 tablet (25 mg total) by mouth daily. 06/15/19   Adam Phenix, MD  ibuprofen (ADVIL) 800 MG tablet Take 1 tablet (800 mg total) by mouth every 6 (six) hours.  06/14/19   Adam Phenix, MD  oxyCODONE (OXY IR/ROXICODONE) 5 MG immediate release tablet Take 1-2 tablets (5-10 mg total) by mouth every 4 (four) hours as needed for moderate pain. 06/14/19   Adam Phenix, MD  Prenatal Vit-Fe Fumarate-FA (PRENATAL VITAMINS PO) Take by mouth.    [provider]    ALLERGIES:  No Known Allergies  SOCIAL HISTORY:  Social History   Tobacco Use  . Smoking status: Former Smoker    Packs/day: 0.25    Types: Cigarettes    Quit date: 07/01/2013    Years since quitting: 5.9  . Smokeless tobacco: Never Used  Substance Use Topics  . Alcohol use: No    Alcohol/week: 0.0 standard drinks    Comment: socially    FAMILY HISTORY: Family History  Adopted: Yes  Problem Relation Age of Onset  . Other Neg Hx   . Cancer Neg Hx   . Hypertension Neg Hx   . Stroke Neg Hx     EXAM: BP (!) 143/90 (BP Location: Left Arm)   Pulse (!) 110   Temp 98.3 F (36.8 C)   Resp 18   SpO2 100%  CONSTITUTIONAL: Alert and oriented and responds appropriately to questions. Well-appearing; well-nourished, pleasant, afebrile HEAD: Normocephalic EYES: Conjunctivae clear, pupils appear equal, EOM appear intact ENT: normal nose; moist mucous membranes NECK: Supple, normal ROM, no meningeal signs CARD: Regular and tachycardic; S1 and S2 appreciated; no murmurs, no clicks, no rubs, no gallops  RESP: Normal chest excursion without splinting or tachypnea; breath sounds clear and equal bilaterally; no wheezes, no rhonchi, no rales, no hypoxia or respiratory distress, speaking full sentences ABD/GI: Normal bowel sounds; non-distended; soft, non-tender, no rebound, no guarding, no peritoneal signs, no hepatosplenomegaly, C-section incision is clean/dry/intact without bleeding or drainage BACK:  The back appears normal EXT: Normal ROM in all joints; no deformity noted, no edema; no cyanosis SKIN: Normal color for age and race; warm; no rash on exposed skin NEURO: Moves all  extremities equally, normal sensation diffusely, cranial nerves II through XII intact, normal speech, normal reflexes diffusely, no clonus PSYCH: The patient's mood and manner are appropriate.   MEDICAL DECISION MAKING: Patient here with hypertension.  Unclear if patient had history of the same or preeclampsia.  Does appear per her records she had HELLP syndrome causing C-section at 23 and 3.  She is complaining of headache currently but no focal neurologic deficits.  No chest pain or shortness of breath.  No sign of volume overload.  Will give IV labetalol for blood pressure and heart rate, IV Toradol for headache.  Will obtain labs, urine.  ED PROGRESS: Labs show mild leukocytosis which is likely reactive.  No fevers.  Her hemoglobin and platelets today are normal.  Her LFTs have significantly improved compared to previous.  She has no protein in her urine today and no sign of infection.  I do not think that this is preeclampsia today but will discuss with OB/GYN on-call.   6:15 AM  Spoke with Dr. Shawnie Pons with OBGYN.  Dr. Shawnie Pons recommended continue to monitor her blood pressure but she is not as concerned about blood pressures currently in the 140s/100s at this time does not recommend any medication changes.  She does recommend imaging patient's head to evaluate for intracranial hemorrhage and continued pain control for her headache.  She states that the symptoms did not improve however, patient may need admission to [facility].  Patient's blood pressure is now 119/82 and her headache is down from a 10/10 to a 6/10.  Will give dose of IV Reglan.  Will hold Benadryl given she is breast-feeding.  She reports feeling much better.  I anticipate the patient will be able to be discharged home.   7:20 AM  Pt's head CT has been reviewed/interpreted and is unremarkable.  No intracranial hemorrhage.  Blood pressures continue to be stable with current pressure of 115/80.  Headache now gone.  Recommended close follow-up  with her OB/GYN as an outpatient and to continue her HCTZ as prescribed.  Patient and husband at bedside are comfortable with this plan.  At this time, I do not feel there is any life-threatening condition present. I have reviewed, interpreted and discussed all results (EKG, imaging, lab, urine as appropriate) and exam findings with patient/family. I have reviewed nursing notes and appropriate previous records.  I feel the patient is safe to be discharged home without further emergent workup and can continue workup as an outpatient as needed. Discussed usual and customary return precautions. Patient/family verbalize understanding and are comfortable with this plan.  Outpatient follow-up has been provided as needed. All questions have been answered.   EKG Interpretation  Date/Time:  Friday June 22 2019 05:49:36 EDT Ventricular Rate:  102 PR Interval:    QRS Duration: 87 QT Interval:  332 QTC Calculation: 433 R Axis:   45 Text Interpretation: Sinus tachycardia Borderline T abnormalities, anterior leads No old tracing to compare Confirmed by Hadar Elgersma, Baxter Hire [phone]) on  06/22/2019 6:35:50 AM        CRITICAL CARE Performed by: Cyril Mourning Jusitn Salsgiver   Total critical care time: 45 minutes  Critical care time was exclusive of separately billable procedures and treating other patients.  Critical care was necessary to treat or prevent imminent or life-threatening deterioration.  Critical care was time spent personally by me on the following activities: development of treatment plan with patient and/or surrogate as well as nursing, discussions with consultants, evaluation of patient's response to treatment, examination of patient, obtaining history from patient or surrogate, ordering and performing treatments and interventions, ordering and review of laboratory studies, ordering and review of radiographic studies, pulse oximetry and re-evaluation of patient's condition.   Orah Amyjo Mizrachi was  evaluated in Emergency Department on 06/22/2019 for the symptoms described in the history of present illness. She was evaluated in the context of the global COVID-19 pandemic, which necessitated consideration that the patient might be at risk for infection with the SARS-CoV-2 virus that causes COVID-19. Institutional protocols and algorithms that pertain to the evaluation of patients at risk for COVID-19 are in a state of rapid change based on information released by regulatory bodies including the CDC and federal and state organizations. These policies and algorithms were followed during the patient's care in the ED.      Lashe Oliveira, Delice Bison, DO 06/22/19 [phone]

## 2019-06-22 NOTE — ED Notes (Signed)
Patient transported to CT 

## 2019-06-23 ENCOUNTER — Ambulatory Visit: Payer: Self-pay

## 2019-06-23 NOTE — Lactation Note (Signed)
This note was copied from a baby's chart. Lactation Consultation Note  Patient Name: Brianna Palmer Date: 06/23/2019 Reason for consult: Follow-up assessment;NICU baby;Infant < 6lbs;Late-preterm 34-36.6wks  LC in to visit with P3 Mom of 26 day old (35w AGA).  Baby SGA at 1400 gm today, increasing weight of 20-30 gm per day.  Baby is exclusively gavage feeding fortified EBM.    Mom placed baby STS on her breast in cross cradle hold, and baby opened his mouth and latched.  Due to Mom's large nipple diameters and length, baby latched onto nipple.  Baby sucking vigorously.  Mom feeling a let down of her milk flow and baby relaxed his grasp and became deeper onto areola.  Swallows identified with each burst of sucks, about 5:1.  Baby taken off the breast after 10 mins, as full gavage feeding to be given.  Mom desires the 72 hr protected breastfeeding.  SLP to assess baby tomorrow.    Mom states her milk supply has increased using the Symphony DEBP and the hand's free "belly band" pumping bra.  She is expressing 2-4 oz per pumping.    Encouraged STS as much as possible.  Feeding Feeding Type: Breast Fed  LATCH Score Latch: Grasps breast easily, tongue down, lips flanged, rhythmical sucking.  Audible Swallowing: Spontaneous and intermittent  Type of Nipple: Everted at rest and after stimulation (large erect nipples)  Comfort (Breast/Nipple): Soft / non-tender  Hold (Positioning): Assistance needed to correctly position infant at breast and maintain latch.  LATCH Score: 9  Interventions Interventions: Breast feeding basics reviewed;Assisted with latch;Skin to skin;Breast massage;Hand express;Adjust position;Support pillows;Position options;DEBP  Lactation Tools Discussed/Used Tools: Pump Breast pump type: Double-Electric Breast Pump   Consult Status Consult Status: Follow-up Date: 06/25/19 Follow-up type: In-patient    Brianna Palmer 06/23/2019, 2:25 PM

## 2019-06-25 ENCOUNTER — Encounter: Payer: Medicaid Other | Admitting: Obstetrics & Gynecology

## 2019-06-26 ENCOUNTER — Ambulatory Visit: Payer: Medicaid Other

## 2019-06-26 ENCOUNTER — Other Ambulatory Visit: Payer: Self-pay

## 2019-06-28 ENCOUNTER — Other Ambulatory Visit: Payer: Self-pay

## 2019-06-28 ENCOUNTER — Encounter: Payer: Self-pay | Admitting: Family Medicine

## 2019-06-28 ENCOUNTER — Ambulatory Visit (INDEPENDENT_AMBULATORY_CARE_PROVIDER_SITE_OTHER): Payer: Medicaid Other | Admitting: Family Medicine

## 2019-06-28 VITALS — BP 123/77 | HR 108 | Wt 181.0 lb

## 2019-06-28 DIAGNOSIS — Z4889 Encounter for other specified surgical aftercare: Secondary | ICD-10-CM

## 2019-06-28 NOTE — Progress Notes (Signed)
   Subjective:    Patient ID: Brianna Palmer, female    DOB: Sep 24, 1985, 34 y.o.   MRN: [id number]  HPI Patient seen for follow up of c/s. She is about 3 weeks post partum. No complaints. Pain is controlled without pain meds. Bleeding normal. Mood normal.   Review of Systems     Objective:   Physical Exam Vitals reviewed.  Constitutional:      Appearance: Normal appearance.  Cardiovascular:     Rate and Rhythm: Normal rate and regular rhythm.     Pulses: Normal pulses.  Abdominal:     General: Abdomen is flat.     Palpations: Abdomen is soft.     Comments: Incision clean, dry, intact.  Skin:    Capillary Refill: Capillary refill takes less than 2 seconds.  Neurological:     General: No focal deficit present.     Mental Status: She is alert.  Psychiatric:        Mood and Affect: Mood normal.        Behavior: Behavior normal.        Thought Content: Thought content normal.        Assessment & Plan:  1. Encounter for post surgical wound check Healing well. F/u in two weeks.

## 2019-06-28 NOTE — Progress Notes (Signed)
Patient present for incision check and is 2 weeks post op. Armandina Stammer RN

## 2019-06-29 ENCOUNTER — Ambulatory Visit: Payer: Self-pay

## 2019-06-29 NOTE — Lactation Note (Signed)
This note was copied from a baby's chart. Lactation Consultation Note  Patient Name: Brianna Palmer IEPPI'R Date: 06/29/2019 Reason for consult: Follow-up assessment  RN requested LC assistance:  Mother was interested in obtaining #30 flanges.  I observed her pumping and it appears that the #27 flanges were an appropriate size.  I allowed her to try the #30 flanges and she stated they were more comfortable.  I asked her to try them and let her RN know if they were not working well for her.  Her nipples are elastic and this may be why she feels some sensitivity.  Nipples are intact.  RN updated.   Maternal Data    Feeding Feeding Type: Breast Milk  LATCH Score                   Interventions    Lactation Tools Discussed/Used     Consult Status Consult Status: PRN Date: 06/29/19 Follow-up type: Call as needed    Brianna Palmer 06/29/2019, 5:13 PM

## 2019-06-29 NOTE — Lactation Note (Signed)
This note was copied from a baby's chart. Lactation Consultation Note  Patient Name: Brianna Palmer ONGEX'B Date: 06/29/2019 Reason for consult: Follow-up assessment  RN asked me to investigate maternal medication, Zyrtec D as it relates to pumping and breast milk. [name] called in this am to report that she has been taking it now for several days and her heart rate has increased. She can think of no other changes other than this medication.  Yesterday, lactation was asked to investigate Zyrtec; however, today we learned that this is Zyrtec D, which has pseudoephedrine, an L3. [name] indicated that she stopped this medication earlier today. I read from [facility] Mother's [facility] (2019) which indicates that maternal tachycardia is a possible known side effect from pseudoephedrine.Little data has been conducted on effects of this medication on preterm babies. Zyrtec (certirizine) (their original formula with no additions) is and L2  Considered compatible with breast feeding. No pediatric concerns reported as per [facility] Mother's [facility] (2019).  Feeding Feeding Type: Breast Milk   Consult Status Consult Status: Follow-up Date: 06/29/19 Follow-up type: Call as needed    Walker Shadow 06/29/2019, 10:01 AM

## 2019-07-10 ENCOUNTER — Ambulatory Visit: Payer: Self-pay

## 2019-07-10 ENCOUNTER — Telehealth: Payer: Self-pay

## 2019-07-10 MED ORDER — NYSTATIN-TRIAMCINOLONE 100000-0.1 UNIT/GM-% EX CREA: TOPICAL_CREAM | CUTANEOUS | 0 refills | Status: DC

## 2019-07-10 NOTE — Lactation Note (Signed)
This note was copied from a baby's chart. Lactation Consultation Note  Patient Name: Brianna Palmer [id number] Date: 07/10/2019 Reason for consult: Follow-up assessment;NICU baby Pecola Leisure is 32 weeks old and corrected gestational age is 37.3 weeks.  Mom is pumping every 3 hours during the day but not waking up at night to pump.  She obtains 60 mls from her left breast and 30 mls from the right.  Recommended pumping every 2 hours during the day when possible and every 4 hours at night.  Baby just started 72 hour breastfeeding.  Mom has been attempting to put baby to breast for a few weeks.  Baby has been inconsistent.  Feedings were sleepy yesterday.  Mom prefers cross cradle hold.  Assisted with positioning baby at breast and reviewed hand expression.  Milk is easily expressed.  Nipples erect and long.  Baby showing good interest in latching.  After a few attempts he latched easily and well.  Active sucking observed for 20 minutes.  A few swallows noted off and on.  Mom states this is the best he has done.  Reviewed preterm feeding behavior and reminded feedings at the breast should improve closer to term.  RN will gavage feed breast milk.  Encouraged to call with concerns/assist prn.  Maternal Data    Feeding Feeding Type: Breast Fed  LATCH Score Latch: Grasps breast easily, tongue down, lips flanged, rhythmical sucking.  Audible Swallowing: A few with stimulation  Type of Nipple: Everted at rest and after stimulation  Comfort (Breast/Nipple): Soft / non-tender  Hold (Positioning): Assistance needed to correctly position infant at breast and maintain latch.  LATCH Score: 8  Interventions Interventions: Assisted with latch;Breast compression;Adjust position;Support pillows;Breast massage;Hand express  Lactation Tools Discussed/Used     Consult Status Consult Status: Follow-up Follow-up type: Call as needed    Huston Foley 07/10/2019, 10:08 AM

## 2019-07-10 NOTE — Telephone Encounter (Signed)
Patient called stating that her baby has thrush. Patient states that her nipples are having some pain and requests a cream.  Sent in script for Nystatin cream per protocol. Armandina Stammer RN

## 2019-07-13 ENCOUNTER — Ambulatory Visit: Payer: Self-pay

## 2019-07-13 NOTE — Lactation Note (Signed)
This note was copied from a baby's chart. Lactation Consultation Note  Patient Name: Brianna Palmer [id number] Date: 07/13/2019   1347 - 1411 - I responded to a page from Ms. Train's RN. [name] was breast pumping upon entry. She stated that she woke up with a swollen area on the top inner quadrant of her left breast. She states that it is tender and sore.  I palpated area and felt a small area of swelling that was diffuse. [name] thinks it may be a bit softer since pumping. She states that it burns. Her milk production is slightly lower on that side than normal, but not significantly below normal.  [name] has been treating the area with massage. She states that with her previous two children that she breast fed exclusively, and she did not experience clogged milk ducts or mastitis.  [name]'s symptoms were consistent with a clogged milk duct. I suggested that she use a warm moist compress before pumping on the affected area and then gentle massage to the breast while pumping. She did not have a pumping bra on hand, but she states that she's going to bring one to the hospital. She does have a belly band that was modified for pumping.  I also suggested that she use ice between pumps and that she may use an OTC anti-inflammatory with her provider's approval.  I recommended that we follow up tomorrow to see if the affected area is resolving. [name] is also breast feeding her son Brianna Palmer. She agreed and asked me to return.   Interventions  Heat; ice; massage  Consult Status  follow up on 7/3    Walker Shadow 07/13/2019, 6:08 PM

## 2019-07-20 ENCOUNTER — Encounter: Payer: Self-pay | Admitting: Family Medicine

## 2019-07-20 ENCOUNTER — Other Ambulatory Visit: Payer: Self-pay

## 2019-07-20 ENCOUNTER — Ambulatory Visit (HOSPITAL_BASED_OUTPATIENT_CLINIC_OR_DEPARTMENT_OTHER): Payer: Medicaid Other | Admitting: Family Medicine

## 2019-07-20 DIAGNOSIS — Z9851 Tubal ligation status: Secondary | ICD-10-CM

## 2019-07-20 DIAGNOSIS — Z98891 History of uterine scar from previous surgery: Secondary | ICD-10-CM

## 2019-07-20 NOTE — Progress Notes (Signed)
    Post Partum Visit Note  Brianna Palmer is a 34 y.o. [phone] female who presents for a postpartum visit. She is 5 weeks postpartum following a repeat cesarean section.  I have fully reviewed the prenatal and intrapartum course. The delivery was at 33 gestational weeks.  Anesthesia: spinal. Postpartum course has been normal. Baby is doing well - in NICU due to prematurity. Baby is feeding by breast. Bleeding red. Bowel function is normal. Bladder function is normal. Patient is sexually active. Contraception method is tubal ligation. Postpartum depression screening: negative.  The following portions of the patient's history were reviewed and updated as appropriate: allergies, current medications, past family history, past medical history, past social history, past surgical history and problem list.  Review of Systems Pertinent items are noted in HPI.    Objective:  Blood pressure 105/69, pulse 78, weight 180 lb (81.6 kg), currently breastfeeding.  General:  alert, cooperative and no distress  Lungs: clear to auscultation bilaterally  Heart:  regular rate and rhythm, S1, S2 normal, no murmur, click, rub or gallop  Abdomen: soft, non-tender; bowel sounds normal; no masses,  no organomegaly. Incision clean, dry, intact        Assessment:    Normal postpartum exam. Pap smear not done at today's visit.   Plan:   Essential components of care per ACOG recommendations:  1.  Mood and well being: Patient with negative depression screening today. Reviewed local resources for support.  - Patient does not use tobacco.   2. Infant care and feeding:  -Patient currently breastmilk feeding? Yes  -Social determinants of health (SDOH) reviewed in EPIC. No concerns  3. Sexuality, contraception and birth spacing - Patient does not want a pregnancy in the next year.    4. Sleep and fatigue -Encouraged family/partner/community support of 4 hrs of uninterrupted sleep to help with mood and  fatigue  5. Physical Recovery  - Discussed patients delivery - Patient has urinary incontinence? No - Patient is safe to resume physical and sexual activity  6.  Health Maintenance - Last pap smear done 09/2018 and was normal with negative HPV.  Levie Heritage, DO [facility] [employer], [facility]

## 2019-07-29 ENCOUNTER — Ambulatory Visit: Payer: Self-pay

## 2019-07-29 NOTE — Lactation Note (Signed)
This note was copied from a baby's chart. Lactation Consultation Note  Patient Name: Brianna Palmer [id number] Date: 07/29/2019  [name] now 26 weeks old.  LC entered room and mom trying to breastfeed on arrival.  Mom trying to breastfeed in cross cradle hold on left breast. Infant coming off and on and fussy. Asked mom if I could assist her with feeding. She reported yes.  Assisted mom with more laid back breastfeeding position for him and he was more content and did not come off and on as much.  Mom noticed it  Mom reports infant has white spots on roof of mouth.  Mom reports that about 2 weeks ago infant was treated for thrush.  Mom wondering if that could be thrush.  Urged her to let RN and pediatrician know her concerns. Mom reports her nipples have been very sore lately.  Mom reprorts she had to add the coconut oil back in to pumping or she couldn't do it.  Discussed flange fit.  Mom reports she feels her flanges are fine.  Mom reports she has increased in flange size during pumping.  Mom wonders if her soreness is due to infant latching now or possible thrush.  Mom reports no noiticible redness or damage on nipples. Discussed contacting OB for possible thrush treatment.  Mom reports she has been prescribed nyastatin cream before and she could not afford it it was 89.00. Discussed OTC treatments for sore nipples.  Gave mom info regarding Dr. Vania Rea' All purpose nipple ointment.  Urged her to always hand express after pumping and breastfeeding and rub expressed mothers milk on nipples and air dry before using anything else. Discussed sterilizing her DEBP parts.  Mom reports she has only been washing with hot soapy water.  Urged her to boil pump parts 10 minutes day.  Referred her to [employer].  Urged mom to call lactation as needed..  Left mom and baby breastfeeding.     Brianna Palmer 07/29/2019, 11:10 PM

## 2019-08-06 ENCOUNTER — Ambulatory Visit: Payer: Self-pay

## 2019-08-06 NOTE — Lactation Note (Signed)
This note was copied from a baby's chart. Lactation Consultation Note  Patient Name: Brianna Palmer [id number] Date: 08/06/2019 Reason for consult: Follow-up assessment;NICU baby  LC in to visit briefly with [name].  Mom states baby breastfeeds well.  He is >6 lbs now at 59 weeks old.  Mom is still supplementing baby with occasional bottles of EBM+/formula, but her goal is to exclusively breastfeed and would like an OP Lactation appointment.  Message sent to [facility] RN IBCLC to follow-up.   Consult Status Consult Status: Complete Date: 08/06/19 Follow-up type: Out-patient    Judee Clara 08/06/2019, 2:44 PM

## 2019-08-09 ENCOUNTER — Ambulatory Visit: Payer: Medicaid Other | Admitting: Obstetrics & Gynecology

## 2019-09-25 ENCOUNTER — Other Ambulatory Visit: Payer: Self-pay

## 2019-09-25 MED ORDER — FLUCONAZOLE 150 MG PO TABS
150.0000 mg | ORAL_TABLET | Freq: Once | ORAL | 1 refills | Status: AC
Start: 2019-09-25 — End: 2019-09-25

## 2019-10-03 ENCOUNTER — Other Ambulatory Visit (HOSPITAL_COMMUNITY)
Admission: RE | Admit: 2019-10-03 | Discharge: 2019-10-03 | Disposition: A | Discharge status: Home / Self Care | Payer: Medicaid Other | Source: Ambulatory Visit | Attending: Family Medicine | Admitting: Family Medicine

## 2019-10-03 ENCOUNTER — Encounter: Payer: Self-pay | Admitting: Family Medicine

## 2019-10-03 ENCOUNTER — Ambulatory Visit: Payer: Medicaid Other | Admitting: Family Medicine

## 2019-10-03 ENCOUNTER — Other Ambulatory Visit: Payer: Self-pay

## 2019-10-03 VITALS — BP 121/68 | HR 70 | Ht 63.0 in | Wt 175.0 lb

## 2019-10-03 DIAGNOSIS — N898 Other specified noninflammatory disorders of vagina: Secondary | ICD-10-CM | POA: Diagnosis not present

## 2019-10-03 DIAGNOSIS — N921 Excessive and frequent menstruation with irregular cycle: Secondary | ICD-10-CM

## 2019-10-03 HISTORY — DX: Excessive and frequent menstruation with irregular cycle: N92.1

## 2019-10-03 MED ORDER — AMOXICILLIN-POT CLAVULANATE 875-125 MG PO TABS: 1.0000 | ORAL_TABLET | Freq: Two times a day (BID) | ORAL | 0 refills | Status: DC

## 2019-10-03 NOTE — Progress Notes (Signed)
   Subjective:    Patient ID: Brianna Palmer is a 34 y.o. female presenting with No chief complaint on file.  on 10/03/2019  HPI: S/;p BTL with RCS 06/2019. Notes painful abdominal pain. Having cycles every 1-2 wks, which drops her milk supply. Previously with regular cycles, monthly lasting 3 days. On OCs for a while (Lo loestrin Fe, which she did not tolerate well). Still nursing.  Pain is on the 2 sides where her tubes were tied. BTL with Filshie clips. Having some vaginal irritation, itching which comes and goes. Notes a vaginal odor, and smells like BV.  Review of Systems  Constitutional: Negative for chills and fever.  Respiratory: Negative for shortness of breath.   Cardiovascular: Negative for chest pain.  Gastrointestinal: Positive for abdominal pain. Negative for nausea and vomiting.  Genitourinary: Positive for vaginal bleeding. Negative for dysuria.  Skin: Negative for rash.      Objective:    BP 121/68   Pulse 70   Ht 5\' 3"  (1.6 m)   Wt 175 lb (79.4 kg)   LMP 07/09/2019   Breastfeeding Yes   BMI 31.00 kg/m  Physical Exam Constitutional:      General: She is not in acute distress.    Appearance: She is well-developed.  HENT:     Head: Normocephalic and atraumatic.  Eyes:     General: No scleral icterus. Cardiovascular:     Rate and Rhythm: Normal rate.  Pulmonary:     Effort: Pulmonary effort is normal.  Abdominal:     Palpations: Abdomen is soft.  Genitourinary:    Comments: BUS normal, vagina is pink and rugated, cervix is nulliparous without lesion, uterus is small and anteverted, no adnexal mass, diffuse tenderness of uterus and adnexa, no CMT.  Musculoskeletal:     Cervical back: Neck supple.  Skin:    General: Skin is warm and dry.  Neurological:     Mental Status: She is alert and oriented to person, place, and time.         Assessment & Plan:   Problem List Items Addressed This Visit      Unprioritized   Menorrhagia with  irregular cycle    Check u/s, TSH, CBC and treat presumptively with Abx (safe in breastfeeding). Could be adenomyosis given 3 prior c-sections. Discussed briefly treatment options. She is not happy with potential hysterectomy as treatment options. Less thrilled with birth control, especially progestin containing due to weight gain. IUD may be needed or D & C with hysteroscopy.      Relevant Medications   amoxicillin-clavulanate (AUGMENTIN) 875-125 MG tablet   Other Relevant Orders   CBC   TSH   07/11/2019 PELVIC COMPLETE WITH TRANSVAGINAL    Other Visit Diagnoses    Vaginal irritation    -  Primary   check culture and treat accordingly   Relevant Orders   Cervicovaginal ancillary only( [place])      Total time in review of prior notes, pathology, labs, history taking, review with patient, exam, note writing, discussion of options, plan for next steps, alternatives and risks of treatment: 26 minutes.  Return in about 6 weeks (around 11/14/2019) for a follow-up.  13/03/2019 10/03/2019 4:48 PM

## 2019-10-03 NOTE — Assessment & Plan Note (Signed)
Check u/s, TSH, CBC and treat presumptively with Abx (safe in breastfeeding). Could be adenomyosis given 3 prior c-sections. Discussed briefly treatment options. She is not happy with potential hysterectomy as treatment options. Less thrilled with birth control, especially progestin containing due to weight gain. IUD may be needed or D & C with hysteroscopy.

## 2019-10-04 LAB — CBC
Hematocrit: 39.4 % (ref 34.0–46.6)
Hemoglobin: 13.2 g/dL (ref 11.1–15.9)
MCH: 28.5 pg (ref 26.6–33.0)
MCHC: 33.5 g/dL (ref 31.5–35.7)
MCV: 85 fL (ref 79–97)
Platelets: 301 10*3/uL (ref 150–450)
RBC: 4.63 x10E6/uL (ref 3.77–5.28)
RDW: 14.6 % (ref 11.7–15.4)
WBC: 8.9 10*3/uL (ref 3.4–10.8)

## 2019-10-04 LAB — TSH: TSH: 1.22 u[IU]/mL (ref 0.450–4.500)

## 2019-10-05 ENCOUNTER — Ambulatory Visit: Payer: Medicaid Other | Admitting: Family Medicine

## 2019-10-05 LAB — CERVICOVAGINAL ANCILLARY ONLY
Bacterial Vaginitis (gardnerella): POSITIVE — AB
Candida Glabrata: NEGATIVE
Candida Vaginitis: NEGATIVE
Chlamydia: NEGATIVE
Comment: NEGATIVE
Comment: NEGATIVE
Comment: NEGATIVE
Comment: NEGATIVE
Comment: NEGATIVE
Comment: NORMAL
Neisseria Gonorrhea: NEGATIVE
Trichomonas: NEGATIVE

## 2019-10-07 MED ORDER — METRONIDAZOLE 0.75 % VA GEL
1.0000 | Freq: Every day | VAGINAL | 1 refills | Status: DC
Start: 2019-10-07 — End: 2019-10-07

## 2019-10-07 NOTE — Addendum Note (Signed)
Addended by: [name] on: 10/07/2019 04:52 PM   Modules accepted: Orders

## 2019-10-16 ENCOUNTER — Other Ambulatory Visit: Payer: Self-pay

## 2019-10-16 ENCOUNTER — Ambulatory Visit (HOSPITAL_BASED_OUTPATIENT_CLINIC_OR_DEPARTMENT_OTHER)
Admission: RE | Admit: 2019-10-16 | Discharge: 2019-10-16 | Disposition: A | Discharge status: Home / Self Care | Payer: Medicaid Other | Source: Ambulatory Visit | Attending: Family Medicine | Admitting: Family Medicine

## 2019-10-16 DIAGNOSIS — N921 Excessive and frequent menstruation with irregular cycle: Secondary | ICD-10-CM | POA: Diagnosis present

## 2019-11-14 ENCOUNTER — Ambulatory Visit: Payer: Medicaid Other | Admitting: Obstetrics & Gynecology

## 2019-12-24 ENCOUNTER — Encounter: Payer: Self-pay | Admitting: General Practice

## 2020-05-12 ENCOUNTER — Other Ambulatory Visit: Payer: Self-pay | Admitting: Obstetrics

## 2020-05-12 DIAGNOSIS — O365930 Maternal care for other known or suspected poor fetal growth, third trimester, not applicable or unspecified: Secondary | ICD-10-CM

## 2020-09-19 ENCOUNTER — Other Ambulatory Visit: Payer: Self-pay

## 2020-09-19 ENCOUNTER — Encounter: Payer: Self-pay | Admitting: Obstetrics & Gynecology

## 2020-09-19 ENCOUNTER — Ambulatory Visit (INDEPENDENT_AMBULATORY_CARE_PROVIDER_SITE_OTHER): Payer: Medicaid Other | Admitting: Obstetrics & Gynecology

## 2020-09-19 ENCOUNTER — Other Ambulatory Visit (HOSPITAL_COMMUNITY)
Admission: RE | Admit: 2020-09-19 | Discharge: 2020-09-19 | Disposition: A | Discharge status: Home / Self Care | Payer: Medicaid Other | Source: Ambulatory Visit | Attending: Obstetrics & Gynecology | Admitting: Obstetrics & Gynecology

## 2020-09-19 VITALS — BP 108/71 | HR 64 | Wt 167.0 lb

## 2020-09-19 DIAGNOSIS — Z113 Encounter for screening for infections with a predominantly sexual mode of transmission: Secondary | ICD-10-CM | POA: Diagnosis not present

## 2020-09-19 DIAGNOSIS — Z01419 Encounter for gynecological examination (general) (routine) without abnormal findings: Secondary | ICD-10-CM | POA: Diagnosis not present

## 2020-09-19 DIAGNOSIS — A6000 Herpesviral infection of urogenital system, unspecified: Secondary | ICD-10-CM | POA: Insufficient documentation

## 2020-09-19 DIAGNOSIS — B9689 Other specified bacterial agents as the cause of diseases classified elsewhere: Secondary | ICD-10-CM

## 2020-09-19 DIAGNOSIS — R102 Pelvic and perineal pain: Secondary | ICD-10-CM

## 2020-09-19 DIAGNOSIS — N760 Acute vaginitis: Secondary | ICD-10-CM

## 2020-09-19 MED ORDER — LIDOCAINE HCL URETHRAL/MUCOSAL 2 % EX GEL: 1.0000 "application " | CUTANEOUS | 2 refills | Status: AC | PRN

## 2020-09-19 MED ORDER — VALACYCLOVIR HCL 1 G PO TABS: 1000.0000 mg | ORAL_TABLET | Freq: Every day | ORAL | 2 refills | Status: AC

## 2020-09-19 NOTE — Patient Instructions (Signed)
Preventive Care 21-34 Years Old, Female Preventive care refers to lifestyle choices and visits with your health care provider that can promote health and wellness. This includes: A yearly physical exam. This is also called an annual wellness visit. Regular dental and eye exams. Immunizations. Screening for certain conditions. Healthy lifestyle choices, such as: Eating a healthy diet. Getting regular exercise. Not using drugs or products that contain nicotine and tobacco. Limiting alcohol use. What can I expect for my preventive care visit? Physical exam Your health care provider may check your: Height and weight. These may be used to calculate your BMI (body mass index). BMI is a measurement that tells if you are at a healthy weight. Heart rate and blood pressure. Body temperature. Skin for abnormal spots. Counseling Your health care provider may ask you questions about your: Past medical problems. Family's medical history. Alcohol, tobacco, and drug use. Emotional well-being. Home life and relationship well-being. Sexual activity. Diet, exercise, and sleep habits. Work and work environment. Access to firearms. Method of birth control. Menstrual cycle. Pregnancy history. What immunizations do I need? Vaccines are usually given at various ages, according to a schedule. Your health care provider will recommend vaccines for you based on your age, medical history, and lifestyle or other factors, such as travel or where you work. What tests do I need? Blood tests Lipid and cholesterol levels. These may be checked every 5 years starting at age 20. Hepatitis C test. Hepatitis B test. Screening Diabetes screening. This is done by checking your blood sugar (glucose) after you have not eaten for a while (fasting). STD (sexually transmitted disease) testing, if you are at risk. BRCA-related cancer screening. This may be done if you have a family history of breast, ovarian, tubal, or  peritoneal cancers. Pelvic exam and Pap test. This may be done every 3 years starting at age 21. Starting at age 30, this may be done every 5 years if you have a Pap test in combination with an HPV test. Talk with your health care provider about your test results, treatment options, and if necessary, the need for more tests. Follow these instructions at home: Eating and drinking  Eat a healthy diet that includes fresh fruits and vegetables, whole grains, lean protein, and low-fat dairy products. Take vitamin and mineral supplements as recommended by your health care provider. Do not drink alcohol if: Your health care provider tells you not to drink. You are pregnant, may be pregnant, or are planning to become pregnant. If you drink alcohol: Limit how much you have to 0-1 drink a day. Be aware of how much alcohol is in your drink. In the [place] one drink equals one 12 oz bottle of beer (355 mL), one 5 oz glass of wine (148 mL), or one 1 oz glass of hard liquor (44 mL). Lifestyle Take daily care of your teeth and gums. Brush your teeth every morning and night with fluoride toothpaste. Floss one time each day. Stay active. Exercise for at least 30 minutes 5 or more days each week. Do not use any products that contain nicotine or tobacco, such as cigarettes, e-cigarettes, and chewing tobacco. If you need help quitting, ask your health care provider. Do not use drugs. If you are sexually active, practice safe sex. Use a condom or other form of protection to prevent STIs (sexually transmitted infections). If you do not wish to become pregnant, use a form of birth control. If you plan to become pregnant, see your health care provider   for a prepregnancy visit. Find healthy ways to cope with stress, such as: Meditation, yoga, or listening to music. Journaling. Talking to a trusted person. Spending time with friends and family. Safety Always wear your seat belt while driving or riding in a  vehicle. Do not drive: If you have been drinking alcohol. Do not ride with someone who has been drinking. When you are tired or distracted. While texting. Wear a helmet and other protective equipment during sports activities. If you have firearms in your house, make sure you follow all gun safety procedures. Seek help if you have been physically or sexually abused. What's next? Go to your health care provider once a year for an annual wellness visit. Ask your health care provider how often you should have your eyes and teeth checked. Stay up to date on all vaccines. This information is not intended to replace advice given to you by your health care provider. Make sure you discuss any questions you have with your health care provider. Document Revised: 03/07/2020 Document Reviewed: 09/08/2017 Elsevier Patient Education  2022 Elsevier Inc.  

## 2020-09-19 NOTE — Progress Notes (Signed)
GYNECOLOGY ANNUAL PREVENTATIVE CARE ENCOUNTER NOTE  History:     Brianna Palmer is a 35 y.o. [phone] female here for a routine annual gynecologic exam.  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 Has a lot of pain during intercourse. Also for months, had low pelvic pain during intercourse; history of three cesarean sections.   Denies abnormal vaginal bleeding, discharge, pelvic pain, other problems with intercourse or other gynecologic concerns.    Gynecologic History Patient's last menstrual period was 09/09/2020 (exact date). Contraception: tubal ligation Last Pap: 10/05/2018. Result was normal with negative HPV  Obstetric History OB History  Gravida Para Term Preterm AB Living  6 3 2 1 3 3   SAB IAB Ectopic Multiple Live Births  2 1   0 3    # Outcome Date GA Lbr Len/2nd Weight Sex Delivery Anes PTL Lv  6 Preterm 06/12/19 [redacted]w[redacted]d  2 lb 13.9 oz (1.3 kg) M CS-LTranv Spinal  LIV  5 Term 04/22/17 [redacted]w[redacted]d  7 lb 7.8 oz (3.395 kg) M CS-LTranv Spinal  LIV  4 Term 03/30/12 [redacted]w[redacted]d  6 lb 3.8 oz (2.83 kg) M CS-LVertical EPI  LIV  3 SAB 2012          2 SAB 2012 [redacted]w[redacted]d         1 IAB 2007            Past Medical History:  Diagnosis Date   Depressive disorder, not elsewhere classified 06/08/2012   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   Menorrhagia with irregular cycle 10/03/2019   Migraine without aura and with status migrainosus, not intractable 11/05/2016    Past Surgical History:  Procedure Laterality Date   BUNIONECTOMY     CESAREAN SECTION N/A 03/30/2012   Procedure: CESAREAN SECTION;  Surgeon: 04/01/2012, MD;  Location: [facility] ORS;  Service: Obstetrics;  Laterality: N/A;  Primary Cesarean Section Delivery Baby Boy @ 0157, Apgars 8/9   CESAREAN SECTION N/A 04/22/2017   Procedure: CESAREAN SECTION;  Surgeon: 06/22/2017, MD;  Location: [facility]IR[facility];  Service: Obstetrics;  Laterality: N/A;   CESAREAN SECTION WITH BILATERAL TUBAL  LIGATION N/A 06/12/2019   Procedure: CESAREAN SECTION WITH BILATERAL TUBAL LIGATION;  Surgeon: 08/12/2019, DO;  Location: [facility] LD ORS;  Service: Obstetrics;  Laterality: N/A;    No current outpatient medications on file prior to visit.   No current facility-administered medications on file prior to visit.    No Known Allergies  Social History:  reports that she quit smoking about 7 years ago. Her smoking use included cigarettes. She smoked an average of .25 packs per day. She has never used smokeless tobacco. She reports that she does not drink alcohol and does not use drugs.  Family History  Adopted: Yes  Problem Relation Age of Onset   Other Neg Hx    Cancer Neg Hx    Hypertension Neg Hx    Stroke Neg Hx     The following portions of the patient's history were reviewed and updated as appropriate: allergies, current medications, past family history, past medical history, past social history, past surgical history and problem list.  Review of Systems Pertinent items noted in HPI and remainder of comprehensive ROS otherwise negative.  Physical Exam:  BP 108/71   Pulse 64   Wt 167 lb (75.8 kg)   LMP 09/09/2020 (Exact Date)   BMI 29.58 kg/m  CONSTITUTIONAL: Well-developed, well-nourished female in no acute distress.  HENT:  Normocephalic, atraumatic, External right and left ear normal.  EYES: Conjunctivae and EOM are normal. Pupils are equal, round, and reactive to light. No scleral icterus.  NECK: Normal range of motion, supple, no masses.  Normal thyroid.  SKIN: Skin is warm and dry. No rash noted. Not diaphoretic. No erythema. No pallor. MUSCULOSKELETAL: Normal range of motion. No tenderness.  No cyanosis, clubbing, or edema. NEUROLOGIC: Alert and oriented to person, place, and time. Normal reflexes, muscle tone coordination.  PSYCHIATRIC: Normal mood and affect. Normal behavior. Normal judgment and thought content. CARDIOVASCULAR: Normal heart rate noted, regular  rhythm RESPIRATORY: Clear to auscultation bilaterally. Effort and breath sounds normal, no problems with respiration noted. BREASTS: Symmetric in size. No masses, tenderness, skin changes, nipple drainage, or lymphadenopathy bilaterally. Performed in the presence of a chaperone. ABDOMEN: Soft, no distention noted.  Mild lower abdominal tenderness on palpation, no rebound or guarding.  PELVIC: Several small ulcerated lesions noted on introitus clustered around inferior part of introitus with erythema. Very tender.  Normal appearing urethral meatus.  Thin, yellow vaginal discharge noted, testing swab obtained. Speculum exam deferred.  Performed in the presence of a chaperone.   Assessment and Plan:    1. Herpes simplex infection of genitourinary system Will treat with Valtrex. Lidocaine jelly also prescribed. Will also evaluate for other concurrent causes of vaginitis and treat accordingly.  - Cervicovaginal ancillary only( [place]) - valACYclovir (VALTREX) 1000 MG tablet; Take 1 tablet (1,000 mg total) by mouth daily. Take for 5 days  Dispense: 7 tablet; Refill: 2 - lidocaine (XYLOCAINE) 2 % jelly; Apply 1 application topically as needed.  Dispense: 30 mL; Refill: 2  2. Screening for STD (sexually transmitted disease) This was desired by patient, will follow up results and manage accordingly. - Cervicovaginal ancillary only( [place]) - RPR+HBsAg+HCVAb+HIV  3. Pelvic pain in female Concerned about possible adhesive disease from previous surgeries or other etiologies. Will follow up test results, ultrasound also ordered.  - Cervicovaginal ancillary only( [place]) - US PELVIC COMPLETE WITH TRANSVAGINAL; Future  4. Well woman exam with routine gynecological exam Patient also desired preventative health maintenance labs evaluation, will follow up results and manage accordingly. - Comprehensive metabolic panel - Lipid panel - CBC - TSH - Hemoglobin A1c Routine preventative health  maintenance measures emphasized. Please refer to After Visit Summary for other counseling recommendations.      Jaynie Collins, MD, FACOG Obstetrician & Gynecologist, [facility] for [employer], [facility]

## 2020-09-20 LAB — LIPID PANEL
Chol/HDL Ratio: 3.9 ratio (ref 0.0–4.4)
Cholesterol, Total: 213 mg/dL — ABNORMAL HIGH (ref 100–199)
HDL: 54 mg/dL (ref >39)
LDL Chol Calc (NIH): 148 mg/dL — ABNORMAL HIGH (ref 0–99)
Triglycerides: 60 mg/dL (ref 0–149)
VLDL Cholesterol Cal: 11 mg/dL (ref 5–40)

## 2020-09-20 LAB — COMPREHENSIVE METABOLIC PANEL
ALT: 14 IU/L (ref 0–32)
AST: 22 IU/L (ref 0–40)
Albumin/Globulin Ratio: 1.4 (ref 1.2–2.2)
Albumin: 4.6 g/dL (ref 3.8–4.8)
Alkaline Phosphatase: 57 IU/L (ref 44–121)
BUN/Creatinine Ratio: 9 (ref 9–23)
BUN: 8 mg/dL (ref 6–20)
Bilirubin Total: <0.2 mg/dL (ref 0.0–1.2)
CO2: 24 mmol/L (ref 20–29)
Calcium: 9.2 mg/dL (ref 8.7–10.2)
Chloride: 100 mmol/L (ref 96–106)
Creatinine, Ser: 0.87 mg/dL (ref 0.57–1.00)
Globulin, Total: 3.3 g/dL (ref 1.5–4.5)
Glucose: 86 mg/dL (ref 65–99)
Potassium: 4 mmol/L (ref 3.5–5.2)
Sodium: 138 mmol/L (ref 134–144)
Total Protein: 7.9 g/dL (ref 6.0–8.5)
eGFR: 90 mL/min/{1.73_m2} (ref >59)

## 2020-09-20 LAB — CBC
Hematocrit: 38.5 % (ref 34.0–46.6)
Hemoglobin: 12.8 g/dL (ref 11.1–15.9)
MCH: 27.7 pg (ref 26.6–33.0)
MCHC: 33.2 g/dL (ref 31.5–35.7)
MCV: 83 fL (ref 79–97)
Platelets: 310 10*3/uL (ref 150–450)
RBC: 4.62 x10E6/uL (ref 3.77–5.28)
RDW: 13.4 % (ref 11.7–15.4)
WBC: 7.8 10*3/uL (ref 3.4–10.8)

## 2020-09-20 LAB — RPR+HBSAG+HCVAB+...
HIV Screen 4th Generation wRfx: NONREACTIVE
Hep C Virus Ab: <0.1 s/co ratio (ref 0.0–0.9)
Hepatitis B Surface Ag: NEGATIVE
RPR Ser Ql: NONREACTIVE

## 2020-09-20 LAB — TSH: TSH: 1.16 u[IU]/mL (ref 0.450–4.500)

## 2020-09-20 LAB — HEMOGLOBIN A1C
Est. average glucose Bld gHb Est-mCnc: 120 mg/dL
Hgb A1c MFr Bld: 5.8 % — ABNORMAL HIGH (ref 4.8–5.6)

## 2020-09-22 LAB — CERVICOVAGINAL ANCILLARY ONLY
Bacterial Vaginitis (gardnerella): POSITIVE — AB
Candida Glabrata: NEGATIVE
Candida Vaginitis: NEGATIVE
Chlamydia: NEGATIVE
Comment: NEGATIVE
Comment: NEGATIVE
Comment: NEGATIVE
Comment: NEGATIVE
Comment: NEGATIVE
Comment: NORMAL
Neisseria Gonorrhea: NEGATIVE
Trichomonas: NEGATIVE

## 2020-09-22 MED ORDER — METRONIDAZOLE 500 MG PO TABS: 500.0000 mg | ORAL_TABLET | Freq: Two times a day (BID) | ORAL | 0 refills | Status: AC

## 2020-09-22 NOTE — Addendum Note (Signed)
Addended by: Jaynie Collins A on: 09/22/2020 12:53 PM   Modules accepted: Orders

## 2020-09-29 ENCOUNTER — Ambulatory Visit (HOSPITAL_BASED_OUTPATIENT_CLINIC_OR_DEPARTMENT_OTHER): Admission: RE | Admit: 2020-09-29 | Payer: Medicaid Other | Source: Ambulatory Visit

## 2021-05-29 ENCOUNTER — Telehealth: Payer: Self-pay | Admitting: General Practice

## 2021-06-11 ENCOUNTER — Ambulatory Visit: Payer: Medicaid Other | Admitting: Obstetrics & Gynecology

## 2021-07-20 ENCOUNTER — Encounter: Payer: Medicaid Other | Admitting: Family Medicine

## 2021-07-29 ENCOUNTER — Encounter: Payer: Self-pay | Admitting: General Practice

## 2021-10-28 IMAGING — US US MFM OB FOLLOW-UP
1 series · 13 of 28 positions shown · non-contrast
Comparison: none

[Series 1: us mfm ob follow-up · 13 of 46 slices shown]
[im 2/46]
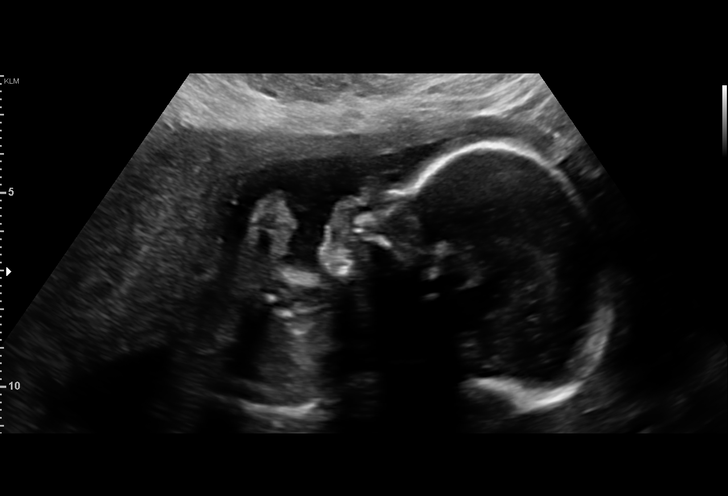
[im 6/46]
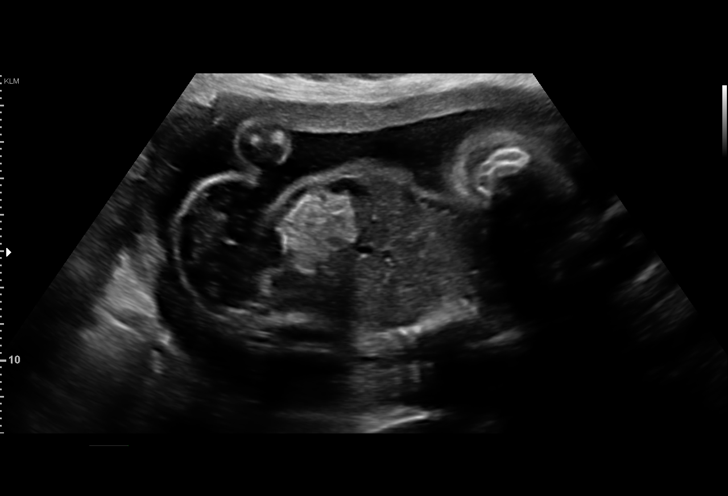
[im 9/46]
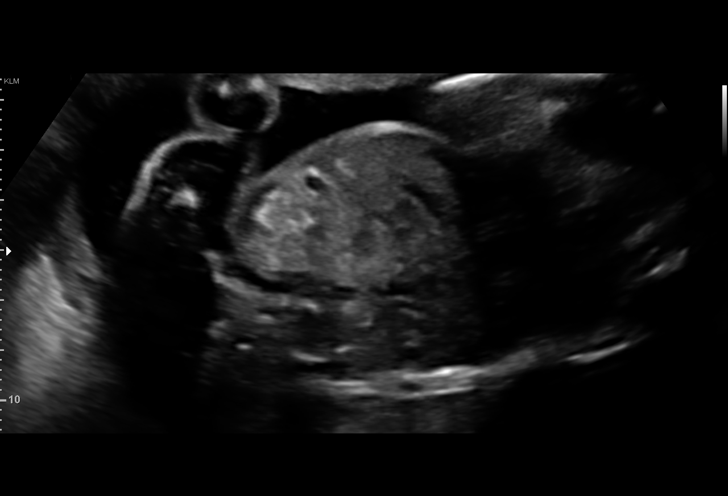
[im 12/46]
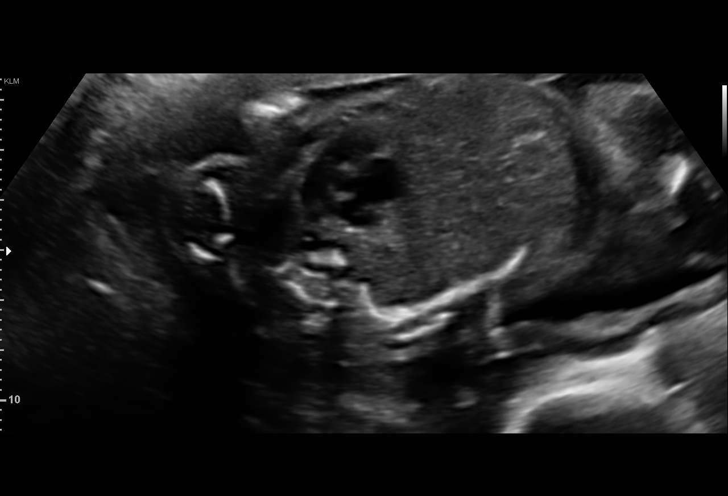
[im 16/46]
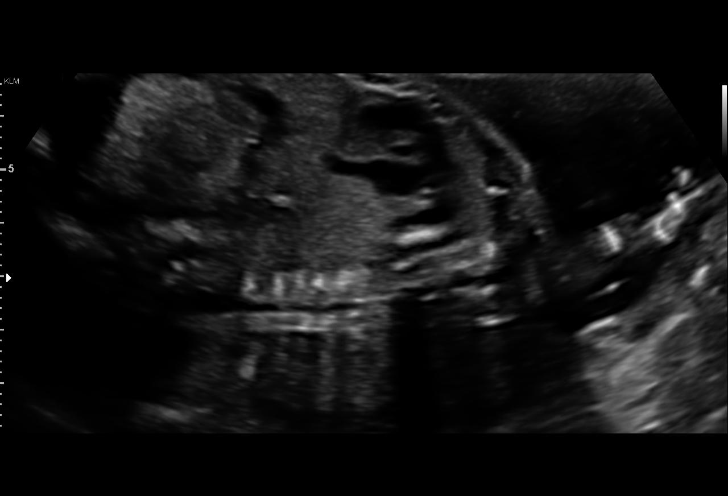
[im 19/46]
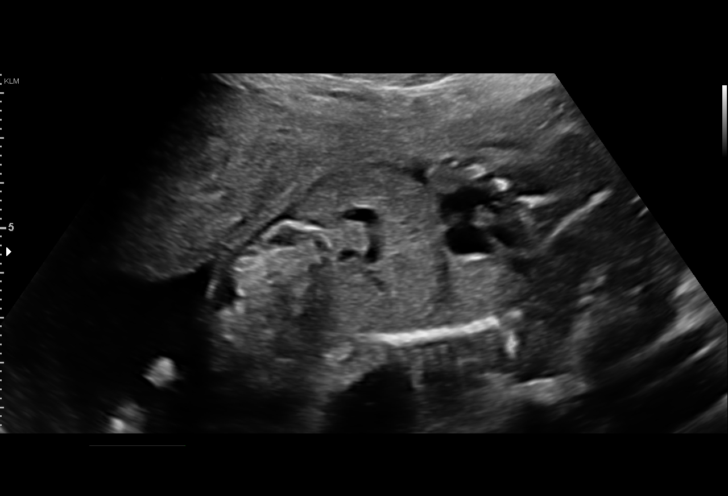
[im 24/46]
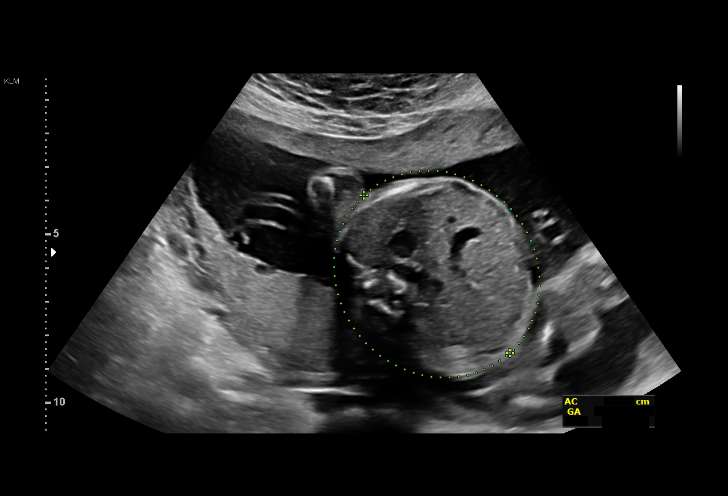
[im 27/46]
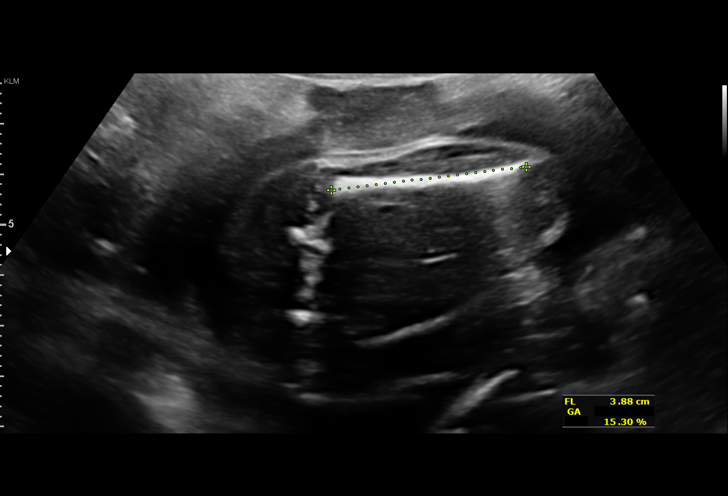
[im 31/46]
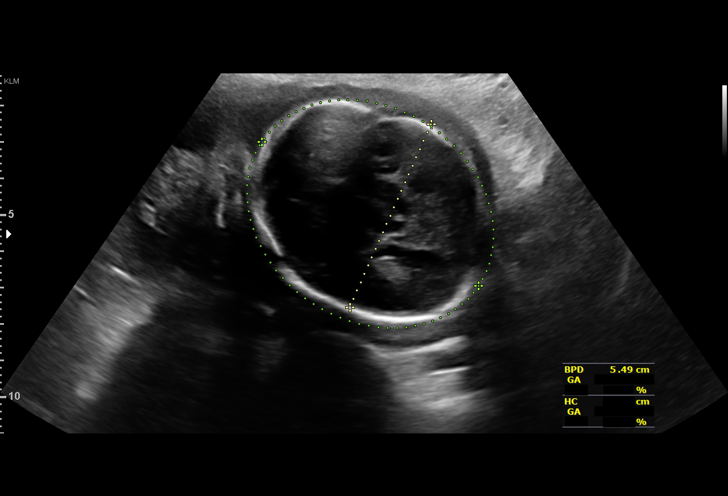
[im 34/46]
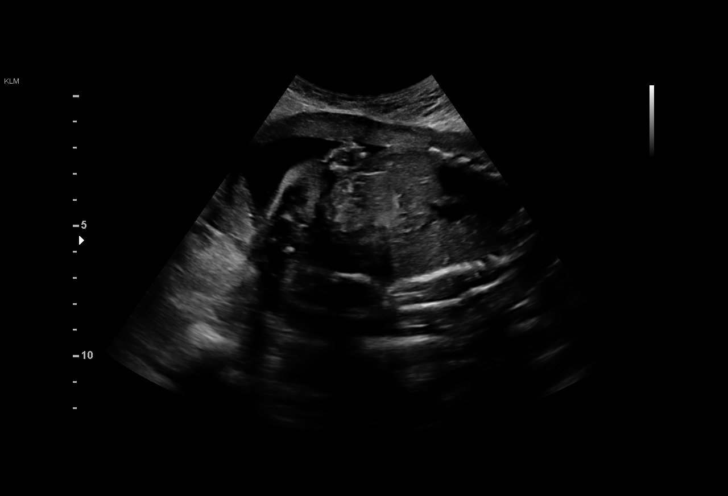
[im 37/46]
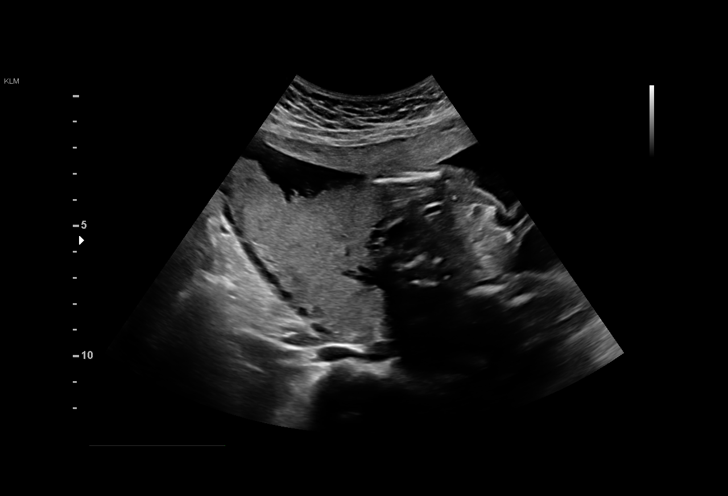
[im 41/46]
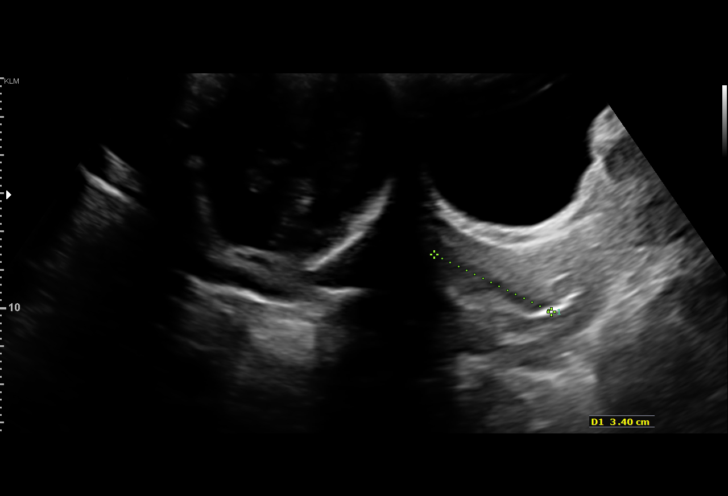
[im 44/46]
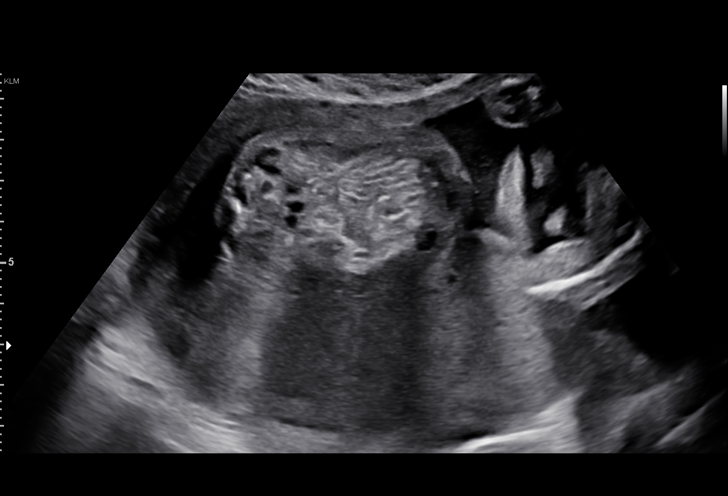

[13 of 28 positions shown; findings below may reference images not displayed]

----------------------------------------------------------------------

 ----------------------------------------------------------------------
Indications

  Echogenic bowel
  23 weeks gestation of pregnancy
  History of cesarean delivery, currently
  pregnant
  Obesity complicating pregnancy, second
  trimester (BMI 30)
  Fetal abnormality - other known or
  suspected (cystic area in abdomen/pelvis)
 ----------------------------------------------------------------------
Vital Signs

                                                Height:        5'3"
Fetal Evaluation

 Num Of Fetuses:         1
 Fetal Heart Rate(bpm):  127
 Cardiac Activity:       Observed
 Presentation:           Cephalic
 Placenta:               Posterior
 P. Cord Insertion:      Previously Visualized

 Amniotic Fluid
 AFI FV:      Within normal limits
Biometry

 BPD:      53.4  mm     G. Age:  22w 2d         12  %    CI:        69.89   %    70 - 86
                                                         FL/HC:      18.5   %    19.2 -
 HC:      203.8  mm     G. Age:  22w 4d         11  %    HC/AC:      1.08        1.05 -
 AC:      188.7  mm     G. Age:  23w 4d         53  %    FL/BPD:     70.8   %    71 - 87
 FL:       37.8  mm     G. Age:  22w 1d          9  %    FL/AC:      20.0   %    20 - 24
 Est. FW:     542  gm      1 lb 3 oz     24  %
OB History

 Gravidity:    6         Term:   2         SAB:   2
 TOP:          1        Living:  2
Gestational Age

 U/S Today:     22w 5d                                        EDD:   08/01/19
 Best:          23w 2d     Det. By:  Early Ultrasound         EDD:   07/28/19
                                     (12/04/18)
Anatomy

 Cranium:               Appears normal         Aortic Arch:            Previously seen
 Cavum:                 Previously seen        Ductal Arch:            Appears normal
 Ventricles:            Appears normal         Diaphragm:              Appears normal
 Choroid Plexus:        Appears normal         Stomach:                Appears normal, left
                                                                       sided
 Cerebellum:            Previously seen        Abdomen:                Echogenic Bowel
                                                                       (cystic area?)
 Posterior Fossa:       Previously seen        Abdominal Wall:         Not well visualized
 Nuchal Fold:           Previously seen        Cord Vessels:           Appears normal (3
                                                                       vessel cord)
 Face:                  Appears normal         Kidneys:                Appear normal
                        (orbits and profile)
 Lips:                  Previously seen        Bladder:                Appears normal,
                                                                       see comments
 Thoracic:              Appears normal         Spine:                  Previously seen
 Heart:                 Appears normal         Upper Extremities:      Previously seen
                        (4CH, axis, and
                        situs)
 RVOT:                  Appears normal         Lower Extremities:      Previously seen
 LVOT:                  Appears normal

 Other:  Heels and 5th digit previously visualized. Technically difficult due to
         fetal position.
Cervix Uterus Adnexa

 Cervix
 Length:            3.4  cm.
 Normal appearance by transabdominal scan.
Comments

 This patient was seen for a follow up growth scan due to
 echogenic bowel with a cystic structure located within the
 echogenic bowel that was noted during her last ultrasound
 exam.
 The patient has been ruled out for an acute infection to CMV
 and toxoplasmosis based on her lab work.  She has also
 screened negative for cystic fibrosis.  Her cell free DNA test
 could not be run as there was a delay due to the weather in
 getting the samples to the lab last month.  The patient denies
 any problems since her last exam.
 She was informed that the fetal growth and amniotic fluid
 level appears appropriate for her gestational age.
 Echogenic bowel continues to be noted on today's exam.
 The cystic structure was not noted within the echogenic
 bowel today.  The patient was advised that many of the
 echogenic bowel cases that we have seen are normal
 variants.  She was reassured that cystic fibrosis and an acute
 infection to CMV have mostly been ruled out.  She was also
 advised that there is still a possibility of intra-bowel pathology
 that may not be diagnosed until after delivery.
 The patient had another cell free DNA test drawn today.  Due
 to the association of echogenic bowel and fetal growth
 restriction, we will continue to follow her with serial growth
 ultrasounds.
 A follow-up growth scan was scheduled in 4 weeks.

## 2021-12-20 IMAGING — US US MFM UA CORD DOPPLER
1 series · 13 of 28 positions shown · non-contrast
Comparison: none

[Series 1: us mfm ua cord doppler · 57 acquisitions, 13 frames shown]
[im 3/57]
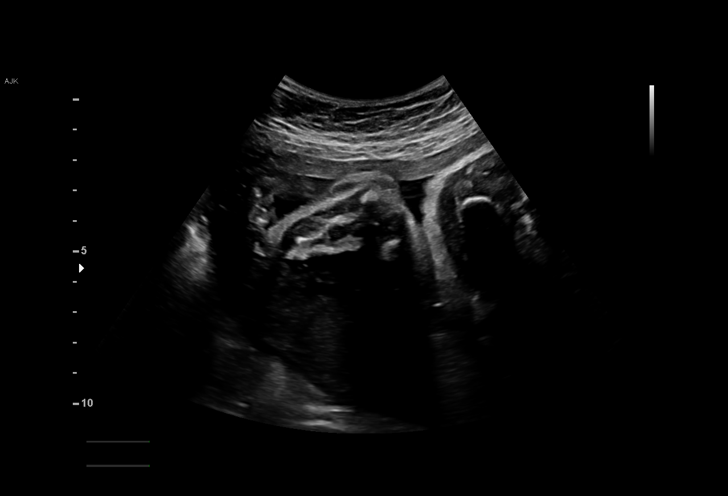
[im 7/57]
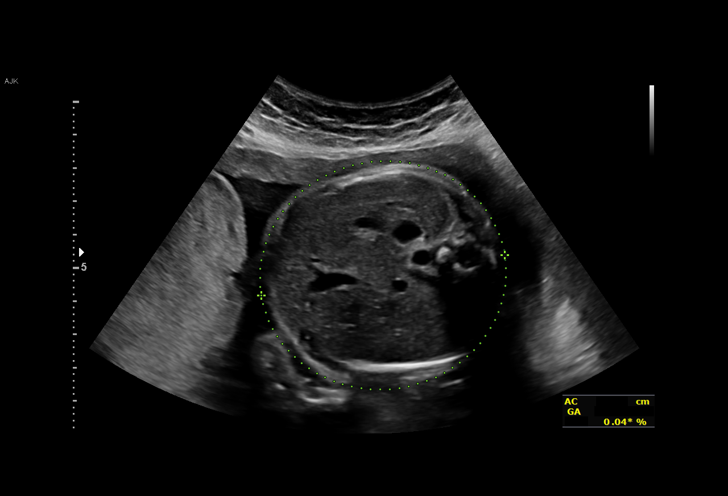
[im 11/57]
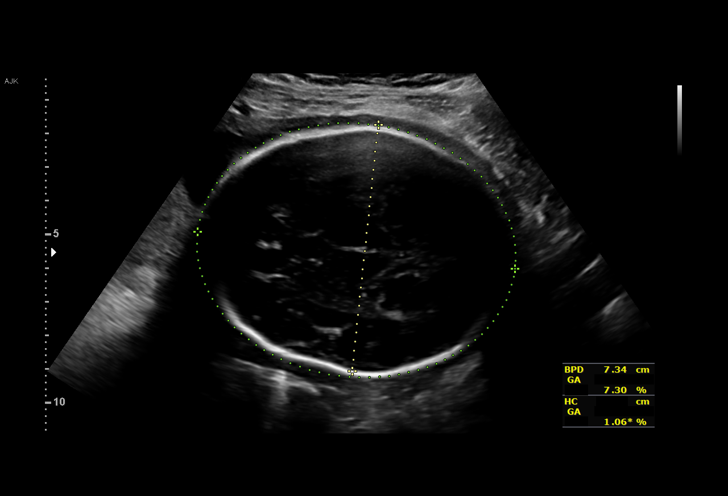
[im 15/57]
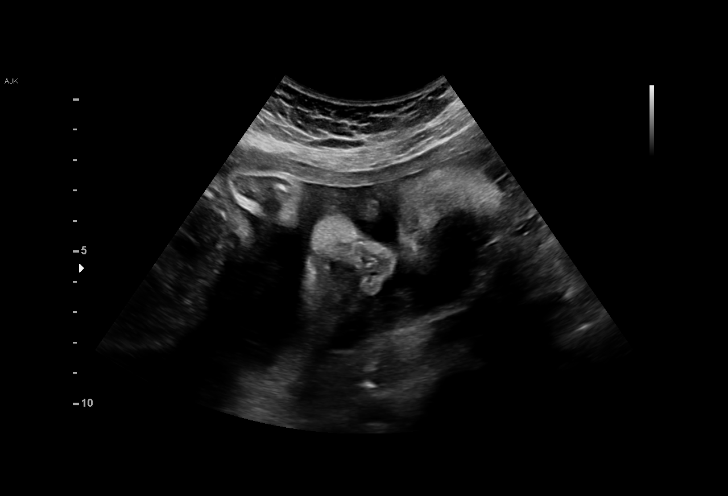
[im 19/57]
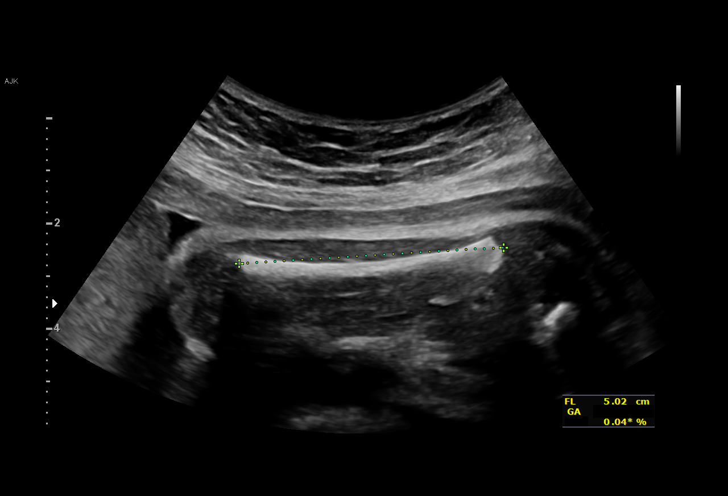
[im 23/57]
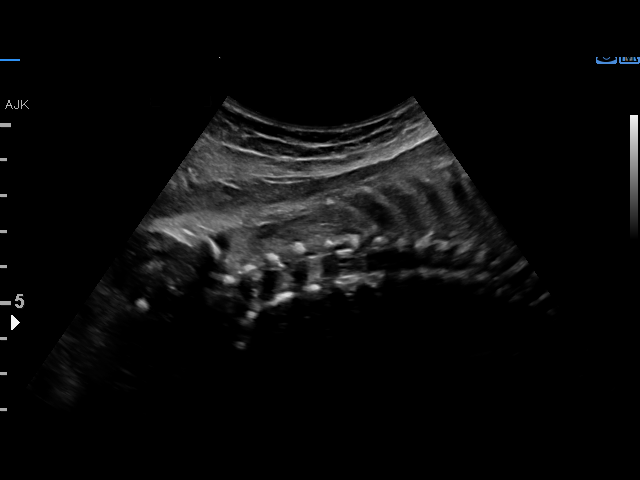
[im 30/57]
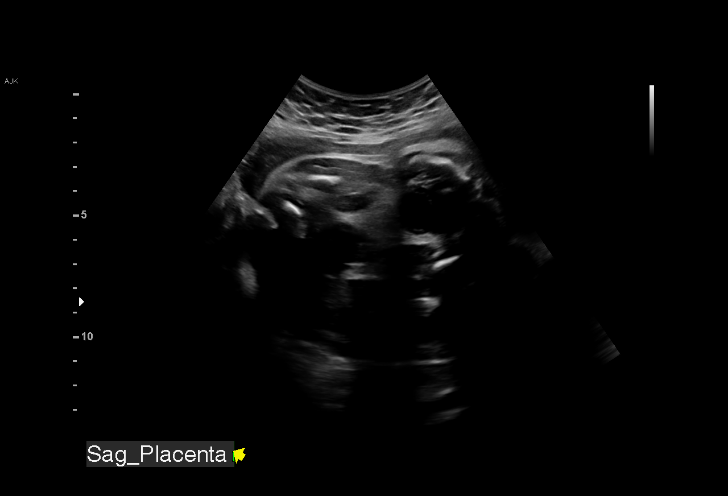
[im 34/57]
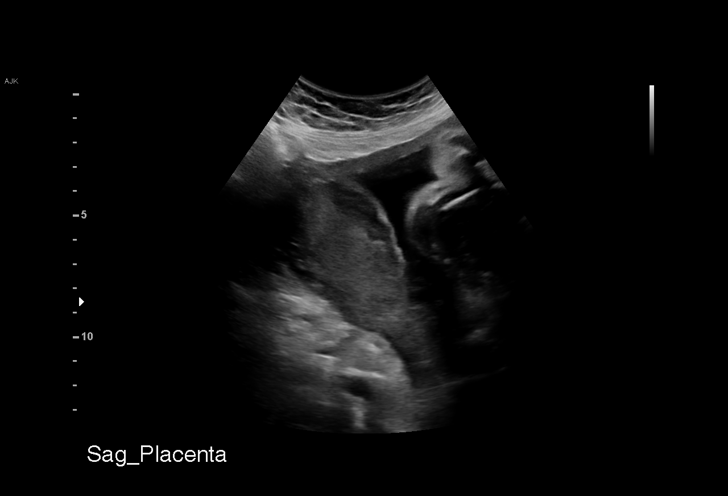
[im 38/57]
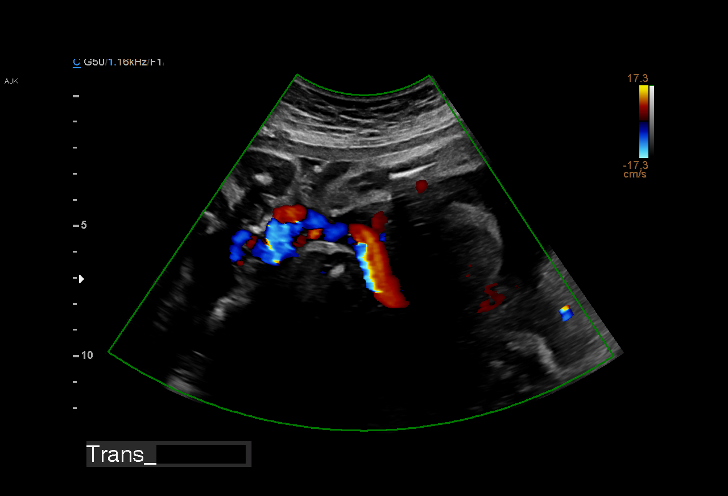
[im 42/57]
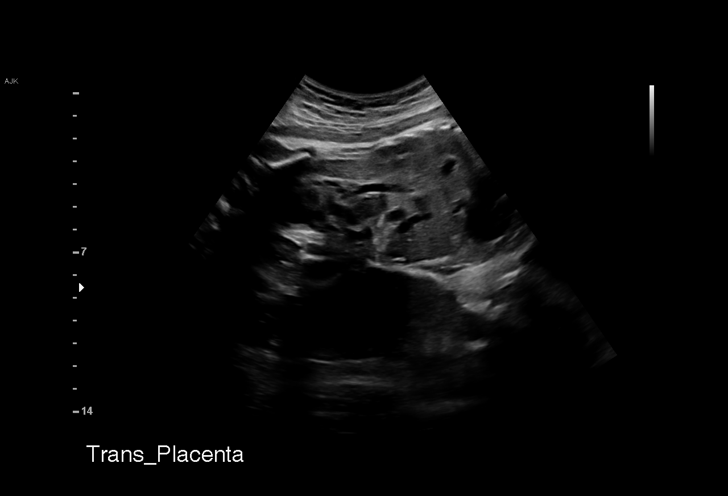
[im 46/57]
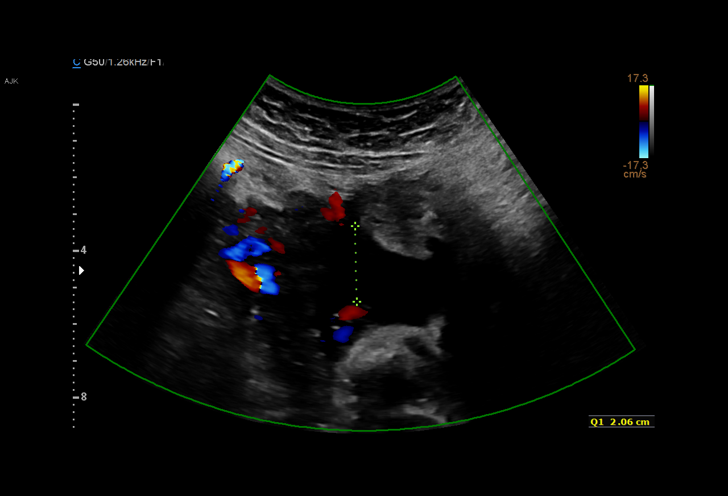
[im 50/57]
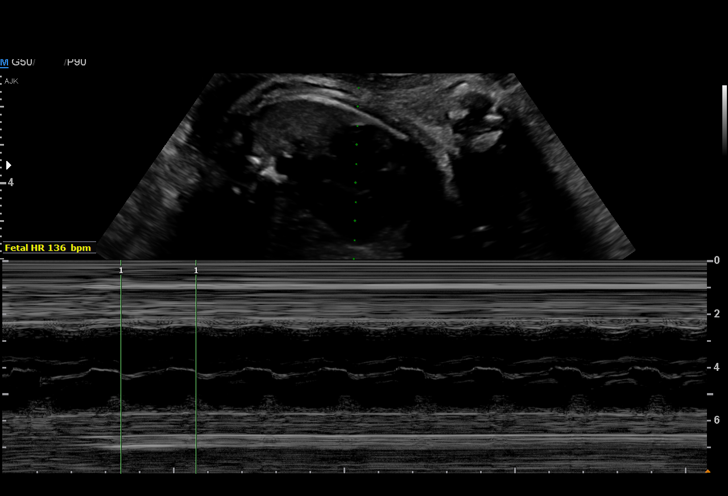
[im 54/57]
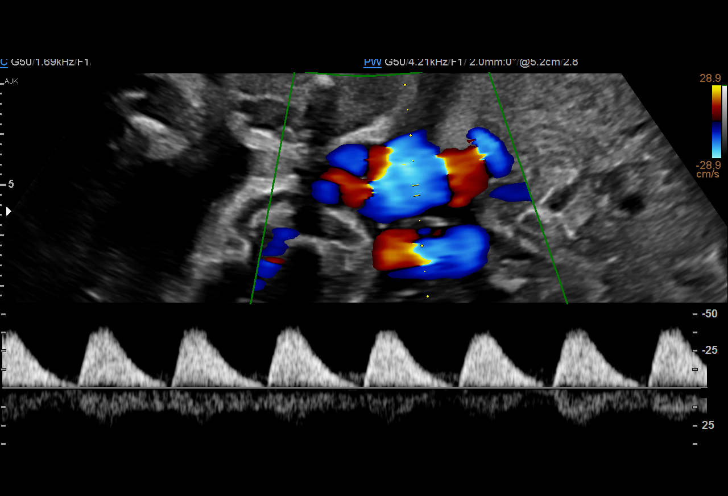

[13 of 28 positions shown; findings below may reference images not displayed]

W/NONSTRESS

Indications

 30 weeks gestation of pregnancy
 Echogenic bowel
 History of cesarean delivery, currently
 pregnant
 Fetal abnormality - other known or
 suspected (cystic area in abdomen/pelvis)
 Obesity complicating pregnancy, third
 trimester
 Maternal care for known or suspected poor
 fetal growth, third trimester, not applicable or
 unspecified IUGR
Vital Signs

                                                Height:        5'3"
Fetal Evaluation

 Num Of Fetuses:         1
 Fetal Heart Rate(bpm):  136
 Cardiac Activity:       Observed
 Presentation:           Cephalic
 Placenta:               Posterior
 Amniotic Fluid
 AFI FV:      Within normal limits

 AFI Sum(cm)     %Tile       Largest Pocket(cm)
 10.3            17

 RUQ(cm)       RLQ(cm)       LUQ(cm)        LLQ(cm)

Biophysical Evaluation

 Amniotic F.V:   Within normal limits       F. Tone:        Observed
 F. Movement:    Observed                   N.S.T:          Reactive
 F. Breathing:   Observed                   Score:          [DATE]
Biometry

 BPD:      72.8  mm     G. Age:  29w 1d        4.8  %    CI:        73.79   %    70 - 86
                                                         FL/HC:      18.6   %    19.3 -
 HC:      269.2  mm     G. Age:  29w 2d        1.2  %    HC/AC:      1.18        0.96 -
 AC:      227.9  mm     G. Age:  27w 1d        < 1  %    FL/BPD:     69.0   %    71 - 87
 FL:       50.2  mm     G. Age:  27w 0d        < 1  %    FL/AC:      22.0   %    20 - 24
 LV:        3.6  mm

 Est. FW:    2490  gm      2 lb 6 oz    < 1  %
OB History

 Gravidity:    6         Term:   2         SAB:   2
 TOP:          1        Living:  2
Gestational Age

 U/S Today:     28w 1d                                        EDD:   08/16/19
 Best:          30w 6d     Det. By:  Early Ultrasound         EDD:   07/28/19
                                     (12/04/18)
Anatomy

 Cranium:               Appears normal         Aortic Arch:            Previously seen
 Cavum:                 Previously seen        Ductal Arch:            Previously seen
 Ventricles:            Previously seen        Diaphragm:              Appears normal
 Choroid Plexus:        Previously seen        Stomach:                Appears normal, left
                                                                       sided
 Cerebellum:            Previously seen        Abdomen:                Echogenic Bowel
                                                                       (cystic area?)
 Posterior Fossa:       Previously seen        Abdominal Wall:         Not well visualized
 Nuchal Fold:           Previously seen        Cord Vessels:           Previously seen
 Face:                  Orbits and profile     Kidneys:                Previously seen
                        previously seen
 Lips:                  Previously seen        Bladder:                Appears normal,
                                                                       see comments
 Thoracic:              Appears normal         Spine:                  Previously seen
 Heart:                 Previously seen        Upper Extremities:      Previously seen
 RVOT:                  Previously seen        Lower Extremities:      Previously seen
 LVOT:                  Previously seen

 Other:  Heels and 5th digit previously visualized. Technically difficult due to
         fetal position.
Doppler - Fetal Vessels

 Umbilical Artery
  S/D     %tile      RI               PI                     ADFV    RDFV
  6.28   > 97.5    0.84              1.7                       Yes      No

Impression

 Severe fetal growth restriction. Patient returned for fetal
 growth and antenatal testing.
 She reports good fetal movements. BP today at our office is
 130/85 mm Hg.
 On today's ultrasound, the estimated fetal weight is at less
 than the 1st percentile. Poor interval weight gain is seen (199
 grams). Amniotic fluid is normal and good fetal activity is
 seen. Fetal breathing movements were seen. Umbilical artery
 Doppler study showed intermittent absent end-diastolic flow.
 NST is reactive for this gestational age. BPP [DATE].
 I explained the findings of severe fetal growth restriction and
 abnormal Doppler studies.
 I discussed and recommended antenatal corticosteroids for
 fetal pulmonary maturity. I informed her that if absent end-
 diastolic low persists, delivery would be recommended at 34
 weeks.
Recommendations

 -Cabezas on [DATE] and 05/30/19.
 -NST on 05/29/19.
 -Twice-weekly BPP and UA Doppler from next week.
                 Teimosa, Lurelma

## 2023-06-08 ENCOUNTER — Other Ambulatory Visit (HOSPITAL_COMMUNITY)
Admission: RE | Admit: 2023-06-08 | Discharge: 2023-06-08 | Disposition: A | Discharge status: Home / Self Care | Source: Ambulatory Visit | Attending: Obstetrics & Gynecology | Admitting: Obstetrics & Gynecology

## 2023-06-08 ENCOUNTER — Ambulatory Visit (INDEPENDENT_AMBULATORY_CARE_PROVIDER_SITE_OTHER): Admitting: Obstetrics & Gynecology

## 2023-06-08 VITALS — BP 112/68 | HR 76 | Ht 63.0 in | Wt 176.0 lb

## 2023-06-08 DIAGNOSIS — Z113 Encounter for screening for infections with a predominantly sexual mode of transmission: Secondary | ICD-10-CM | POA: Diagnosis present

## 2023-06-08 DIAGNOSIS — Z01419 Encounter for gynecological examination (general) (routine) without abnormal findings: Secondary | ICD-10-CM

## 2023-06-08 DIAGNOSIS — N943 Premenstrual tension syndrome: Secondary | ICD-10-CM | POA: Diagnosis not present

## 2023-06-08 MED ORDER — NORETHINDRONE ACET-ETHINYL EST 1-20 MG-MCG PO TABS: 1.0000 | ORAL_TABLET | Freq: Every day | ORAL | 11 refills | Status: AC

## 2023-06-08 NOTE — Patient Instructions (Signed)
 Birth Control Pills (Oral Contraceptives): What to Know Oral contraceptive pills, or birth control pills, are medicines that prevent pregnancy. They work by: Preventing the ovaries from releasing eggs. Thickening mucus in the lower part of the uterus called the cervix. This prevents sperm from getting in the uterus. Thinning the lining of the uterus. This prevents a fertilized egg from attaching to the lining. Birth control pills are highly effective at preventing pregnancy when you take them exactly as told. Birth control pills do not prevent sexually transmitted infections (STIs). Use condoms while taking birth control pills to help prevent STIs. What happens before starting birth control pills? Before you start taking birth control pills: You may have a physical exam, blood test, and Pap test. Your health care provider will make sure it's OK for you to use birth control pills. Birth control pills are not a good option for: People who smoke and are older than age 3. People who have or have had certain conditions, such as: High blood pressure. Deep vein thrombosis. Blood clots in the lungs. Stroke. Heart disease or recent heart attack. Peripheral vascular disease. Blood clotting disorder or history of blood clots. Certain cancers. Diabetes. Gallbladder disease. High cholesterol. Kidney disease. Liver disease. Migraine headaches. Systemic lupus erythematosus (SLE). Unusual vaginal bleeding. Ask your provider about the possible side effects of the birth control pills. It can take 2-3 months for your body to adjust to changes in hormone levels. Types of birth control pills  Birth control pills have the hormones estrogen and progestin in them, or progestin only. The combination pill This type of pill contains estrogen and progestin hormones. These pills come in packs of 21 or 28 pills. Some packs with 28-day pills contain estrogen and progestin for the first 21-24 days. Hormone-free  pills, called inactive pills, are taken for the final 4-7 days. You should have menstrual bleeding during the time you take the inactive pills. In packs with 21 pills, you take no pills for the remaining 7 days in a 28-day period of time. Menstrual bleeding happens during these days. Some people prefer taking a pill for 28 days to help create a routine. Extended-interval contraception pills come in packs of 91 pills. The first 84 pills have both estrogen and progestin. The last 7 pills are inactive pills. Menstrual bleeding happens during the inactive pill days. With this schedule, menstrual bleeding happens once every 3 months. Continuous contraception pills come in packs of 28 pills. All pills in the pack contain estrogen and progestin. With this schedule, regular menstrual bleeding does not happen. But there may be spotting or irregular bleeding. Progestin-only pills This type of pill is often called the mini-pill and contains the progestin hormone only. It comes in packs of 28 pills. In some packs, the last 4 pills are inactive pills. You need to take this pill at the same time every day to prevent pregnancy. Menstrual bleeding may not be regular or predictable. What are the advantages? Birth control provides reliable and continuous contraception if taken as told. It may treat or decrease symptoms of: Menstrual period cramps, heavy menstrual flow, or bleeding from the uterus that's not normal. Irregular menstrual cycle or bleeding. Acne, depending on the type of pill. Polycystic ovarian syndrome (PCOS). Endometriosis. Iron deficiency anemia. Premenstrual symptoms, including very bad depression, anxiety, or getting annoyed very easily. It may also: Reduce the risk of endometrial and ovarian cancer. Be used as emergency contraception. Prevent ectopic pregnancies and infections of the fallopian tubes. What can  make birth control pills less effective? Birth control pills may be less effective  if: You forget to take the pill every day. Birth control pills may not work as well if you miss more than one pill. You may need to use a back-up contraceptive. For progestin-only pills, it's especially important to take the pill at the same time each day. Even taking it 3 hours late can increase the risk of pregnancy. You have a disease of the stomach or intestines that makes your body less able to absorb the pill. You take birth control pills with other medicines that make them less effective, such as antibiotics, certain HIV medicines, and some seizure medicines. You take expired pills. You forget to restart the pill after 7 days of not taking it. This applies to the packs of 21 pills and extended-interval packs of 91 pills. What are the side effects and risks? Birth control pills can sometimes cause side effects, such as: Headache. Depression. Trouble sleeping. Nausea and vomiting, bloating, or fluid retention. Breast tenderness. Irregular bleeding or spotting during the first several months. Increase in blood pressure. Gallbladder problems. Liver injury. Unusual vaginal discharge, itching, or smell. Sun sensitivity. Birth control pills with estrogen and progestin may slightly increase the risk of: Blood clots. Heart attack. Stroke. Follow these instructions at home: Follow instructions from your provider about how to start taking your first cycle of birth control pills. Depending on when you start the pill, you may need to use a backup form of birth control, such as condoms, during the first week. Make sure you know what steps to take if you forget to take a pill. If you think you're pregnant, stop taking birth control pills right away. Contact a health care provider if: If you think you're pregnant. This information is not intended to replace advice given to you by your health care provider. Make sure you discuss any questions you have with your health care provider. Document  Revised: 07/11/2022 Document Reviewed: 07/11/2022 Elsevier Patient Education  2024 [employer].

## 2023-06-08 NOTE — Progress Notes (Signed)
 Subjective:     Brianna Palmer is a 38 y.o. female here for a routine exam.  Current complaints: Pt reports 2 weeks out of every month she has decreased energy, sweats, and bad mood. She reports that she can tell when she is ovulating and when the sx are about to present.      Gynecologic History Patient's last menstrual period was 05/25/2023 (exact date). Contraception: tubal ligation Last Pap: 10/05/2018. Results were: normal Last mammogram: n/a.   Obstetric History OB History  Gravida Para Term Preterm AB Living  6 3 2 1 3 3   SAB IAB Ectopic Multiple Live Births  2 1  0 3    # Outcome Date GA Lbr Len/2nd Weight Sex Type Anes PTL Lv  6 Preterm 06/12/19 [redacted]w[redacted]d  2 lb 13.9 oz (1.3 kg) M CS-LTranv Spinal  LIV  5 Term 04/22/17 109w2d  7 lb 7.8 oz (3.395 kg) M CS-LTranv Spinal  LIV  4 Term 03/30/12 [redacted]w[redacted]d  6 lb 3.8 oz (2.83 kg) M CS-LVertical EPI  LIV  3 SAB 2012          2 SAB 2012 [redacted]w[redacted]d         1 IAB 2007           The following portions of the patient's history were reviewed and updated as appropriate: allergies, current medications, past family history, past medical history, past social history, past surgical history, and problem list.  Review of Systems Pertinent items are noted in HPI.    Objective:  BP 112/68 (BP Location: Left Arm, Patient Position: Sitting, Cuff Size: Large)   Pulse 76   Ht 5\' 3"  (1.6 m)   Wt 176 lb (79.8 kg)   LMP 05/25/2023 (Exact Date)   BMI 31.18 kg/m  General Appearance:    Alert, cooperative, no distress, appears stated age  Head:    Normocephalic, without obvious abnormality, atraumatic  Eyes:    conjunctiva/corneas clear, EOM's intact, both eyes  Ears:    Normal external ear canals, both ears  Nose:   Nares normal, septum midline, mucosa normal, no drainage    or sinus tenderness  Throat:   Lips, mucosa, and tongue normal; teeth and gums normal  Neck:   Supple, symmetrical, trachea midline, no adenopathy;    thyroid:  no  enlargement/tenderness/nodules  Back:     Symmetric, no curvature, ROM normal, no CVA tenderness  Lungs:     respirations unlabored  Chest Wall:    No tenderness or deformity   Heart:    Regular rate and rhythm  Breast Exam:    No tenderness, masses, or nipple abnormality  Abdomen:     Soft, non-tender, bowel sounds active all four quadrants,    no masses, no organomegaly  Genitalia:    Normal female without lesion, discharge or tenderness     Extremities:   Extremities normal, atraumatic, no cyanosis or edema  Pulses:   2+ and symmetric all extremities  Skin:   Skin color, texture, turgor normal, no rashes or lesions     Assessment:    Healthy female exam.  PMDD likely- reviewed treatment options including increasing SSRI vs adding low dose OCPs. Pt opts to add OCPs to regulate cycles.   NO contraindication to OCPs.     Plan:   Brianna Palmer was seen today for gynecologic exam.  Diagnoses and all orders for this visit:  Well woman exam with routine gynecological exam -     Cytology - PAP(  [place])  PMS (premenstrual syndrome) -     norethindrone -ethinyl estradiol  (LOESTRIN 1/20, 21,) 1-20 MG-MCG tablet; Take 1 tablet by mouth daily. Take continuous for 3 months then withdraw.  Routine screening for STI (sexually transmitted infection) -     Cervicovaginal ancillary only( [place])   F/u in 3 months prn  F/u in 1 year for annual.  Jaamal Farooqui L. Harraway-Smith, M.D., FACOG

## 2023-06-10 LAB — CYTOLOGY - PAP
Comment: NEGATIVE
Diagnosis: NEGATIVE
High risk HPV: NEGATIVE

## 2023-06-13 LAB — CERVICOVAGINAL ANCILLARY ONLY
Bacterial Vaginitis (gardnerella): POSITIVE — AB
Candida Glabrata: NEGATIVE
Candida Vaginitis: NEGATIVE
Chlamydia: NEGATIVE
Comment: NEGATIVE
Comment: NEGATIVE
Comment: NEGATIVE
Comment: NEGATIVE
Comment: NEGATIVE
Comment: NORMAL
Neisseria Gonorrhea: NEGATIVE
Trichomonas: NEGATIVE

## 2023-06-14 ENCOUNTER — Other Ambulatory Visit: Payer: Self-pay

## 2023-06-14 DIAGNOSIS — B9689 Other specified bacterial agents as the cause of diseases classified elsewhere: Secondary | ICD-10-CM

## 2023-06-14 MED ORDER — METRONIDAZOLE 500 MG PO TABS: 500.0000 mg | ORAL_TABLET | Freq: Two times a day (BID) | ORAL | 0 refills | Status: AC

## 2023-06-24 ENCOUNTER — Other Ambulatory Visit: Payer: Self-pay | Admitting: Obstetrics & Gynecology

## 2023-12-19 ENCOUNTER — Emergency Department (HOSPITAL_COMMUNITY)
Admission: EM | Admit: 2023-12-19 | Discharge: 2023-12-19 | Disposition: A | Discharge status: Home / Self Care | Attending: Emergency Medicine | Admitting: Emergency Medicine

## 2023-12-19 ENCOUNTER — Emergency Department (HOSPITAL_COMMUNITY)

## 2023-12-19 ENCOUNTER — Other Ambulatory Visit: Payer: Self-pay

## 2023-12-19 ENCOUNTER — Encounter (HOSPITAL_COMMUNITY): Payer: Self-pay

## 2023-12-19 DIAGNOSIS — K5900 Constipation, unspecified: Secondary | ICD-10-CM | POA: Insufficient documentation

## 2023-12-19 DIAGNOSIS — R1032 Left lower quadrant pain: Secondary | ICD-10-CM

## 2023-12-19 LAB — CBC WITH DIFFERENTIAL/PLATELET
Abs Immature Granulocytes: 0.01 K/uL (ref 0.00–0.07)
Basophils Absolute: 0 K/uL (ref 0.0–0.1)
Basophils Relative: 0 %
Eosinophils Absolute: 0.2 K/uL (ref 0.0–0.5)
Eosinophils Relative: 2 %
HCT: 35.4 % — ABNORMAL LOW (ref 36.0–46.0)
Hemoglobin: 11.6 g/dL — ABNORMAL LOW (ref 12.0–15.0)
Immature Granulocytes: 0 %
Lymphocytes Relative: 45 %
Lymphs Abs: 3.3 K/uL (ref 0.7–4.0)
MCH: 27.8 pg (ref 26.0–34.0)
MCHC: 32.8 g/dL (ref 30.0–36.0)
MCV: 84.7 fL (ref 80.0–100.0)
Monocytes Absolute: 0.5 K/uL (ref 0.1–1.0)
Monocytes Relative: 7 %
Neutro Abs: 3.3 K/uL (ref 1.7–7.7)
Neutrophils Relative %: 46 %
Platelets: 273 K/uL (ref 150–400)
RBC: 4.18 MIL/uL (ref 3.87–5.11)
RDW: 14 % (ref 11.5–15.5)
WBC: 7.4 K/uL (ref 4.0–10.5)
nRBC: 0 % (ref 0.0–0.2)

## 2023-12-19 LAB — BASIC METABOLIC PANEL WITH GFR
Anion gap: 9 (ref 5–15)
BUN: 13 mg/dL (ref 6–20)
CO2: 24 mmol/L (ref 22–32)
Calcium: 9.2 mg/dL (ref 8.9–10.3)
Chloride: 104 mmol/L (ref 98–111)
Creatinine, Ser: 0.81 mg/dL (ref 0.44–1.00)
GFR, Estimated: >60 mL/min (ref >60)
Glucose, Bld: 98 mg/dL (ref 70–99)
Potassium: 3.7 mmol/L (ref 3.5–5.1)
Sodium: 137 mmol/L (ref 135–145)

## 2023-12-19 LAB — PREGNANCY, URINE: Preg Test, Ur: NEGATIVE

## 2023-12-19 LAB — URINALYSIS, ROUTINE W REFLEX MICROSCOPIC
Bilirubin Urine: NEGATIVE
Glucose, UA: NEGATIVE mg/dL
Hgb urine dipstick: NEGATIVE
Ketones, ur: NEGATIVE mg/dL
Leukocytes,Ua: NEGATIVE
Nitrite: NEGATIVE
Protein, ur: NEGATIVE mg/dL
Specific Gravity, Urine: 1.013 (ref 1.005–1.030)
pH: 5 (ref 5.0–8.0)

## 2023-12-19 LAB — WET PREP, GENITAL
Clue Cells Wet Prep HPF POC: NONE SEEN
Sperm: NONE SEEN
Trich, Wet Prep: NONE SEEN
WBC, Wet Prep HPF POC: <10 (ref <10)
Yeast Wet Prep HPF POC: NONE SEEN

## 2023-12-19 MED ORDER — KETOROLAC TROMETHAMINE 15 MG/ML IJ SOLN
15.0000 mg | Freq: Once | INTRAMUSCULAR | Status: AC
  Administered 2023-12-19: 15 mg via INTRAMUSCULAR
  Filled 2023-12-19: qty 1

## 2023-12-19 MED ORDER — CYCLOBENZAPRINE HCL 10 MG PO TABS: 10.0000 mg | ORAL_TABLET | Freq: Two times a day (BID) | ORAL | 0 refills | Status: AC | PRN

## 2023-12-19 MED ORDER — NAPROXEN 500 MG PO TABS: 500.0000 mg | ORAL_TABLET | Freq: Two times a day (BID) | ORAL | 0 refills | Status: AC

## 2023-12-19 NOTE — ED Provider Triage Note (Signed)
 Emergency Medicine Provider Triage Evaluation Note  Brianna Palmer , a 38 y.o. female  was evaluated in triage.  Pt complains of progressively worsening left pelvic pain over 3 weeks, now causing nausea. Reports menses on time this past week but heavier. No fever.  Review of Systems  Positive: Pelvic pain Negative: fever  Physical Exam  BP 118/78 (BP Location: Right Arm)   Pulse 75   Temp 98.1 F (36.7 C)   Resp 18   SpO2 100%  Gen:   Awake, no distress   Resp:  Normal effort  MSK:   Moves extremities without difficulty  Other:    Medical Decision Making  Medically screening exam initiated at 1:44 PM.  Appropriate orders placed.  Brianna Palmer was informed that the remainder of the evaluation will be completed by another provider, this initial triage assessment does not replace that evaluation, and the importance of remaining in the ED until their evaluation is complete.     Odell Balls, PA-C 12/19/23 [id number]

## 2023-12-19 NOTE — ED Triage Notes (Signed)
 Patient c/o L lower abd pain near her left ovary, 7/10, worse when she pees, endorses urinary frequency. Patient has hx of tubal ligation.

## 2023-12-19 NOTE — ED Notes (Signed)
Called x2 for room 

## 2023-12-19 NOTE — ED Provider Notes (Signed)
 [facility] AT [facility] Provider Note   CSN: [id number] Arrival date & time: 12/19/23  1317     Patient presents with: Abdominal Pain   Brianna Palmer is a 38 y.o. female.  Patient is a 38 year old female with a history of migraines, menorrhagia, and tubal ligation 4 years prior who presents to the ED for increasing left lower abdominal pain for the past several weeks.  She notes pain is intermittent but has become more frequent and severe.  States this is her first time being evaluated.  She has tried to take Tylenol  and ibuprofen  with minimal relief.  Notes she will occasionally get hot flashes with this pain but no other symptoms.  No other recent abdominal surgeries.  States she has had some issues with constipation recently where she is only passing very small hard stools.  She is still passing gas normally.  Last menstrual cycle was 2 weeks prior and was normal.  She notes her menstrual cycles are very regular now.  Denies fevers, chills, headache, chest pain, shortness of breath, vomiting/diarrhea, dysuria, hematuria, vaginal pain/discharge, bleeding/spotting.    Abdominal Pain Associated symptoms: constipation   Associated symptoms: no chest pain, no diarrhea, no dysuria, no fever, no hematuria, no nausea, no shortness of breath, no vaginal bleeding, no vaginal discharge and no vomiting        Prior to Admission medications   Medication Sig Start Date End Date Taking? Authorizing Provider  cyclobenzaprine  (FLEXERIL ) 10 MG tablet Take 1 tablet (10 mg total) by mouth 2 (two) times daily as needed for muscle spasms. 12/19/23  Yes Aaryav Hopfensperger, Thersia RAMAN, PA-C  naproxen  (NAPROSYN ) 500 MG tablet Take 1 tablet (500 mg total) by mouth 2 (two) times daily. 12/19/23  Yes Daxtin Leiker, Thersia RAMAN, PA-C  FLUoxetine (PROZAC) 20 MG capsule Take 20 mg by mouth daily.    [provider]  lidocaine  (XYLOCAINE ) 2 % jelly Apply 1 application topically as  needed. Patient not taking: Reported on 06/08/2023 09/19/20   Anyanwu, Ugonna A, MD  metroNIDAZOLE  (FLAGYL ) 500 MG tablet Take 1 tablet (500 mg total) by mouth 2 (two) times daily. 06/14/23   Corene Coy, MD  norethindrone -ethinyl estradiol  (LOESTRIN 1/20, 21,) 1-20 MG-MCG tablet Take 1 tablet by mouth daily. Take continuous for 3 months then withdraw. 06/08/23   Corene Coy, MD  valACYclovir  (VALTREX ) 1000 MG tablet Take 1 tablet (1,000 mg total) by mouth daily. Take for 5 days Patient not taking: Reported on 06/08/2023 09/19/20   Anyanwu, Ugonna A, MD    Allergies: Patient has no known allergies.    Review of Systems  Constitutional:  Negative for fever.  Respiratory:  Negative for shortness of breath.   Cardiovascular:  Negative for chest pain.  Gastrointestinal:  Positive for abdominal pain and constipation. Negative for diarrhea, nausea and vomiting.  Genitourinary:  Negative for dysuria, hematuria, vaginal bleeding and vaginal discharge.  Neurological:  Negative for headaches.  All other systems reviewed and are negative.   Updated Vital Signs BP 121/80 (BP Location: Left Arm)   Pulse (!) 56   Temp 98.1 F (36.7 C)   Resp 18   Ht 5' 3 (1.6 m)   Wt 79.8 kg   SpO2 100%   BMI 31.18 kg/m   Physical Exam Constitutional:      Appearance: She is well-developed.  HENT:     Head: Normocephalic and atraumatic.  Cardiovascular:     Rate and Rhythm: Normal rate.  Abdominal:  General: Bowel sounds are normal.     Palpations: Abdomen is soft.     Tenderness: There is abdominal tenderness in the left lower quadrant.     Comments: Pain in the abdomen and very left lower quadrant almost in the groin area.  No obvious masses, guarding, edema.  Skin:    General: Skin is warm and dry.  Neurological:     Mental Status: She is alert and oriented to person, place, and time.  Psychiatric:        Mood and Affect: Mood normal.        Behavior: Behavior normal.      (all labs ordered are listed, but only abnormal results are displayed) Labs Reviewed  CBC WITH DIFFERENTIAL/PLATELET - Abnormal; Notable for the following components:      Result Value   Hemoglobin 11.6 (*)    HCT 35.4 (*)    All other components within normal limits  URINALYSIS, ROUTINE W REFLEX MICROSCOPIC - Abnormal; Notable for the following components:   Color, Urine STRAW (*)    All other components within normal limits  WET PREP, GENITAL  BASIC METABOLIC PANEL WITH GFR  PREGNANCY, URINE  GC/CHLAMYDIA PROBE AMP ([place]) NOT AT [facility]    EKG: None  Radiology: US  PELVIC COMPLETE W TRANSVAGINAL AND TORSION R/O Result Date: 12/19/2023 CLINICAL DATA:  Pelvic pain. EXAM: TRANSABDOMINAL AND TRANSVAGINAL ULTRASOUND OF PELVIS DOPPLER ULTRASOUND OF OVARIES TECHNIQUE: Both transabdominal and transvaginal ultrasound examinations of the pelvis were performed. Transabdominal technique was performed for global imaging of the pelvis including uterus, ovaries, adnexal regions, and pelvic cul-de-sac. It was necessary to proceed with endovaginal exam following the transabdominal exam to visualize the uterus, endometrium, ovaries and adnexal regions. Color and duplex Doppler ultrasound was utilized to evaluate blood flow to the ovaries. COMPARISON:  10/16/2019. FINDINGS: Uterus Measurements: 9.4 x 4.5 x 5.9 cm = volume: 129.6 mL. No fibroids or other mass visualized. Endometrium Thickness: 7 mm, within normal limits for a premenopausal female. No focal abnormality visualized. Right ovary Measurements: 3.4 x 2.5 x 1.5 cm = volume:  6.9 mL. Scattered follicles, 1 of which has a cyst within a cyst appearance with a thin wall. Doppler: There is normal vascularity on color doppler examination. Spectral doppler arterial and venous waveforms are normal. Left ovary Measurements: 2.6 x 1.8 x 2.0 cm = volume: 4.9 mL. Normal appearance/no adnexal mass. Doppler: There is normal vascularity on color doppler  examination. Spectral doppler arterial and venous waveforms are normal. Other findings No abnormal free fluid. IMPRESSION: No acute findings. Electronically Signed   By: Newell Eke M.D.   On: 12/19/2023 16:03      Medications Ordered in the ED  ketorolac  (TORADOL ) 15 MG/ML injection 15 mg (15 mg Intramuscular Given 12/19/23 1709)                                   Medical Decision Making Patient is a 38 year old female who presents to the ED for left lower abdominal pain for the past 2 to 3 weeks that has been increasing in severity and frequency.  Please see detailed HPI above.  On exam patient is alert and well-appearing.  Physical exam as noted above.  The pain is noted to be very low in the left lower quadrant almost in the groin area.  There is no masses bulging or distention noted.  Lab workup reassuring including no leukocytosis or  signs of infection on UA.  Wet prep negative.  Differential includes ovarian cyst, ovarian torsion, PID, TOA, muscle strain, constipation, acute abdomen.    Ultrasound of the pelvis was obtained and was negative for any acute process.  There is no cyst noted and blood flow was appropriate to bilateral ovaries, no acute signs of torsion.  Less concerns for acute abdomen due to location of patient's pain as well as normal lab findings today.  Suspect patient's symptoms could be exacerbated by constipation as she notes she is only been having very small hard bowel movements.  Otherwise well-appearing, afebrile, and tolerating oral intake.  Stable for discharge home at this time.  Prescribe naproxen , Flexeril , and MiraLAX.  Symptomatic care discussed.  Advised PCP or gynecology follow-up in 1 week for recheck.  Return precautions provided for worsening symptoms.  Risk Prescription drug management.      Final diagnoses:  Left groin pain  Constipation, unspecified constipation type    ED Discharge Orders          Ordered    naproxen  (NAPROSYN ) 500 MG  tablet  2 times daily        12/19/23 1707    cyclobenzaprine  (FLEXERIL ) 10 MG tablet  2 times daily PRN        12/19/23 1707               Neysa Thersia RAMAN, PA-C 12/19/23 1926    Rogelia Jerilynn RAMAN, MD 12/19/23 2344

## 2023-12-19 NOTE — Discharge Instructions (Addendum)
 May take naproxen  twice a day as needed for pain.  Do not take extra ibuprofen  at the same time.  May take Flexeril  2-3 times a day as needed for pain/muscle spasms.  Apply heat pads to the area to help with pain as well.  Begin taking MiraLAX daily for the next 3 to 5 days to help with constipation.  Stay hydrated and drink plenty of fluids.  Please follow-up with PCP or gynecology in the next 1 to 2 weeks for reevaluation.  Return to ED if any symptoms worsen including severe uncontrolled pain, new fevers, uncontrollable nausea/vomiting.

## 2023-12-20 LAB — GC/CHLAMYDIA PROBE AMP (~~LOC~~) NOT AT ARMC
Chlamydia: NEGATIVE
Comment: NEGATIVE
Comment: NORMAL
Neisseria Gonorrhea: NEGATIVE
